# Patient Record
Sex: Female | Born: 1950 | Race: Black or African American | Hispanic: No | Marital: Married | Smoking: Never smoker
Health system: Southern US, Community
[De-identification: ages and names within clinical notes are randomized; demographics above are authoritative.]

## PROBLEM LIST (undated history)

## (undated) DIAGNOSIS — M1990 Unspecified osteoarthritis, unspecified site: Secondary | ICD-10-CM

## (undated) DIAGNOSIS — E0500 Thyrotoxicosis with diffuse goiter without thyrotoxic crisis or storm: Secondary | ICD-10-CM

## (undated) DIAGNOSIS — I10 Essential (primary) hypertension: Secondary | ICD-10-CM

## (undated) DIAGNOSIS — F419 Anxiety disorder, unspecified: Secondary | ICD-10-CM

## (undated) DIAGNOSIS — E785 Hyperlipidemia, unspecified: Secondary | ICD-10-CM

## (undated) HISTORY — PX: TUBAL LIGATION: SHX77

## (undated) HISTORY — PX: CATARACT EXTRACTION W/ INTRAOCULAR LENS  IMPLANT, BILATERAL: SHX1307

## (undated) HISTORY — PX: ABDOMINAL HYSTERECTOMY: SHX81

## (undated) HISTORY — PX: EYE SURGERY: SHX253

---

## 1998-08-16 ENCOUNTER — Other Ambulatory Visit: Admission: RE | Admit: 1998-08-16 | Discharge: 1998-08-16 | Payer: Self-pay | Admitting: Obstetrics and Gynecology

## 1999-08-28 ENCOUNTER — Encounter: Admission: RE | Admit: 1999-08-28 | Discharge: 1999-08-28 | Payer: Self-pay | Admitting: Obstetrics and Gynecology

## 1999-08-28 ENCOUNTER — Encounter: Payer: Self-pay | Admitting: Obstetrics and Gynecology

## 2000-03-16 ENCOUNTER — Ambulatory Visit (HOSPITAL_COMMUNITY): Admission: RE | Admit: 2000-03-16 | Discharge: 2000-03-16 | Payer: Self-pay | Admitting: Gastroenterology

## 2000-08-04 ENCOUNTER — Encounter: Admission: RE | Admit: 2000-08-04 | Discharge: 2000-08-04 | Payer: Self-pay | Admitting: Internal Medicine

## 2000-08-04 ENCOUNTER — Encounter: Payer: Self-pay | Admitting: Internal Medicine

## 2000-09-09 ENCOUNTER — Encounter: Admission: RE | Admit: 2000-09-09 | Discharge: 2000-09-09 | Payer: Self-pay | Admitting: Obstetrics and Gynecology

## 2000-09-09 ENCOUNTER — Encounter: Payer: Self-pay | Admitting: Obstetrics and Gynecology

## 2001-09-23 ENCOUNTER — Encounter: Admission: RE | Admit: 2001-09-23 | Discharge: 2001-09-23 | Payer: Self-pay | Admitting: Obstetrics and Gynecology

## 2001-09-23 ENCOUNTER — Encounter: Payer: Self-pay | Admitting: Obstetrics and Gynecology

## 2002-10-02 ENCOUNTER — Encounter: Admission: RE | Admit: 2002-10-02 | Discharge: 2002-10-02 | Payer: Self-pay | Admitting: Obstetrics and Gynecology

## 2002-10-02 ENCOUNTER — Encounter: Payer: Self-pay | Admitting: Obstetrics and Gynecology

## 2003-10-17 ENCOUNTER — Encounter: Admission: RE | Admit: 2003-10-17 | Discharge: 2003-10-17 | Payer: Self-pay | Admitting: Obstetrics and Gynecology

## 2004-11-05 ENCOUNTER — Encounter: Admission: RE | Admit: 2004-11-05 | Discharge: 2004-11-05 | Payer: Self-pay | Admitting: Obstetrics and Gynecology

## 2005-11-09 ENCOUNTER — Encounter: Admission: RE | Admit: 2005-11-09 | Discharge: 2005-11-09 | Payer: Self-pay | Admitting: Obstetrics and Gynecology

## 2006-03-29 ENCOUNTER — Encounter: Admission: RE | Admit: 2006-03-29 | Discharge: 2006-03-29 | Payer: Self-pay | Admitting: Neurology

## 2006-09-13 ENCOUNTER — Encounter: Admission: RE | Admit: 2006-09-13 | Discharge: 2006-09-13 | Payer: Self-pay | Admitting: Internal Medicine

## 2006-10-04 ENCOUNTER — Encounter: Admission: RE | Admit: 2006-10-04 | Discharge: 2006-10-04 | Payer: Self-pay | Admitting: Internal Medicine

## 2006-11-11 ENCOUNTER — Encounter: Admission: RE | Admit: 2006-11-11 | Discharge: 2006-11-11 | Payer: Self-pay | Admitting: Obstetrics and Gynecology

## 2007-05-16 ENCOUNTER — Encounter: Admission: RE | Admit: 2007-05-16 | Discharge: 2007-05-16 | Payer: Self-pay | Admitting: Internal Medicine

## 2007-11-16 ENCOUNTER — Emergency Department (HOSPITAL_COMMUNITY): Admission: EM | Admit: 2007-11-16 | Discharge: 2007-11-16 | Payer: Self-pay | Admitting: Emergency Medicine

## 2007-11-21 ENCOUNTER — Encounter: Admission: RE | Admit: 2007-11-21 | Discharge: 2007-11-21 | Payer: Self-pay | Admitting: Obstetrics and Gynecology

## 2008-11-21 ENCOUNTER — Encounter: Admission: RE | Admit: 2008-11-21 | Discharge: 2008-11-21 | Payer: Self-pay | Admitting: Internal Medicine

## 2008-11-30 ENCOUNTER — Encounter: Admission: RE | Admit: 2008-11-30 | Discharge: 2008-11-30 | Payer: Self-pay | Admitting: Internal Medicine

## 2009-02-07 ENCOUNTER — Emergency Department (HOSPITAL_COMMUNITY): Admission: EM | Admit: 2009-02-07 | Discharge: 2009-02-07 | Payer: Self-pay | Admitting: Emergency Medicine

## 2009-06-03 ENCOUNTER — Encounter: Admission: RE | Admit: 2009-06-03 | Discharge: 2009-06-03 | Payer: Self-pay | Admitting: Obstetrics and Gynecology

## 2009-12-04 ENCOUNTER — Encounter: Admission: RE | Admit: 2009-12-04 | Discharge: 2009-12-04 | Payer: Self-pay | Admitting: Obstetrics and Gynecology

## 2010-02-04 ENCOUNTER — Encounter: Admission: RE | Admit: 2010-02-04 | Discharge: 2010-02-04 | Payer: Self-pay | Admitting: Internal Medicine

## 2010-06-16 ENCOUNTER — Encounter: Payer: Self-pay | Admitting: Internal Medicine

## 2010-08-29 LAB — DIFFERENTIAL
Basophils Absolute: 0 10*3/uL (ref 0.0–0.1)
Basophils Relative: 1 % (ref 0–1)
Eosinophils Absolute: 0.2 10*3/uL (ref 0.0–0.7)
Eosinophils Relative: 5 % (ref 0–5)
Lymphocytes Relative: 39 % (ref 12–46)
Lymphs Abs: 2 10*3/uL (ref 0.7–4.0)
Monocytes Absolute: 0.5 10*3/uL (ref 0.1–1.0)
Monocytes Relative: 10 % (ref 3–12)
Neutro Abs: 2.3 10*3/uL (ref 1.7–7.7)
Neutrophils Relative %: 46 % (ref 43–77)

## 2010-08-29 LAB — BASIC METABOLIC PANEL
BUN: 14 mg/dL (ref 6–23)
CO2: 30 mEq/L (ref 19–32)
Calcium: 9.4 mg/dL (ref 8.4–10.5)
Chloride: 106 mEq/L (ref 96–112)
Creatinine, Ser: 1.11 mg/dL (ref 0.4–1.2)
GFR calc Af Amer: >60 mL/min (ref >60)
GFR calc non Af Amer: 50 mL/min — ABNORMAL LOW (ref >60)
Glucose, Bld: 123 mg/dL — ABNORMAL HIGH (ref 70–99)
Potassium: 3.9 mEq/L (ref 3.5–5.1)
Sodium: 141 mEq/L (ref 135–145)

## 2010-08-29 LAB — CBC
HCT: 35.3 % — ABNORMAL LOW (ref 36.0–46.0)
Hemoglobin: 11.8 g/dL — ABNORMAL LOW (ref 12.0–15.0)
MCHC: 33.3 g/dL (ref 30.0–36.0)
MCV: 89.8 fL (ref 78.0–100.0)
Platelets: 334 10*3/uL (ref 150–400)
RBC: 3.93 MIL/uL (ref 3.87–5.11)
RDW: 13.9 % (ref 11.5–15.5)
WBC: 5.1 10*3/uL (ref 4.0–10.5)

## 2010-08-29 LAB — POCT CARDIAC MARKERS
CKMB, poc: 3.3 ng/mL (ref 1.0–8.0)
Myoglobin, poc: 126 ng/mL (ref 12–200)
Troponin i, poc: <0.05 ng/mL (ref 0.00–0.09)

## 2010-10-10 NOTE — Procedures (Signed)
[place]  Patient:    Brooke Brown, Brooke Brown                   MRN: [id number] Proc. Date: 03/15/00 Adm. Date:  [id number] Disc. Date: [id number] Attending:  Charna Elizabeth CC:         Merlene Laughter. Renae Gloss, M.D.   Procedure Report  DATE OF BIRTH:  03-01-1951  PROCEDURE PERFORMED:  Flexible sigmoidoscopy up to 80 cm.  ENDOSCOPIST:  Anselmo Rod, M.D.  INSTRUMENT USED:  Olympus video colonoscope.  INDICATION FOR PROCEDURE:  Screening flexible sigmoidoscopy being performed in a 60 year old black female, rule out colonic polyps, masses, hemorrhoids, etc.  PREPROCEDURE PREPARATION:  Informed consent was procured from the patient. The patient was fasted for eight hours prior to the procedure and prepped with two fleets enemas the morning of the procedure.  PREPROCEDURE PHYSICAL:  VITAL SIGNS:  The patient has stable vital signs.  NECK: Supple.  CHEST:  Clear to auscultation.  S1 and S2 regular.  ABDOMEN:  Soft with normal abdominal bowel sounds.  DESCRIPTION OF PROCEDURE:  The patient was placed in the left lateral decubitus position.  No sedation was used.  Once the patient was adequately positioned, the Olympus video colonoscope was advanced from the rectum to 80 cm without difficulty.  No masses, polyps, erosions, ulcerations or abnormalities were seen.  There was no evidence of diverticulosis.  The patient tolerated the procedure well without complication.  IMPRESSION:  Normal flexible sigmoidoscopy up to 80 cm.  RECOMMENDATIONS:  Repeat colorectal cancer screening is recommended in the next five years or earlier if the patient develops any abnormal symptoms in the interim. DD:  03/17/00 TD:  03/18/00 Job: [id number]

## 2010-11-18 ENCOUNTER — Other Ambulatory Visit: Payer: Self-pay | Admitting: Obstetrics and Gynecology

## 2010-11-18 DIAGNOSIS — R921 Mammographic calcification found on diagnostic imaging of breast: Secondary | ICD-10-CM

## 2010-12-15 ENCOUNTER — Ambulatory Visit
Admission: RE | Admit: 2010-12-15 | Discharge: 2010-12-15 | Disposition: A | Discharge status: Home / Self Care | Payer: BC Managed Care – PPO | Source: Ambulatory Visit | Attending: Obstetrics and Gynecology | Admitting: Obstetrics and Gynecology

## 2010-12-15 DIAGNOSIS — R921 Mammographic calcification found on diagnostic imaging of breast: Secondary | ICD-10-CM

## 2011-02-19 LAB — POCT CARDIAC MARKERS
CKMB, poc: 3.6
Myoglobin, poc: 139
Operator id: 264031
Troponin i, poc: <0.05

## 2011-02-19 LAB — POCT I-STAT, CHEM 8
BUN: 15
Calcium, Ion: 1.16
Chloride: 100
Creatinine, Ser: 1.2
Glucose, Bld: 157 — ABNORMAL HIGH
HCT: 39
Hemoglobin: 13.3
Potassium: 3 — ABNORMAL LOW
Sodium: 138
TCO2: 28

## 2011-07-31 ENCOUNTER — Other Ambulatory Visit: Payer: Self-pay

## 2011-07-31 ENCOUNTER — Emergency Department (HOSPITAL_COMMUNITY): Payer: BC Managed Care – PPO

## 2011-07-31 ENCOUNTER — Emergency Department (HOSPITAL_COMMUNITY)
Admission: EM | Admit: 2011-07-31 | Discharge: 2011-07-31 | Disposition: A | Discharge status: Home / Self Care | Payer: BC Managed Care – PPO | Attending: Emergency Medicine | Admitting: Emergency Medicine

## 2011-07-31 ENCOUNTER — Encounter (HOSPITAL_COMMUNITY): Payer: Self-pay

## 2011-07-31 DIAGNOSIS — E119 Type 2 diabetes mellitus without complications: Secondary | ICD-10-CM | POA: Insufficient documentation

## 2011-07-31 DIAGNOSIS — R42 Dizziness and giddiness: Secondary | ICD-10-CM

## 2011-07-31 DIAGNOSIS — I10 Essential (primary) hypertension: Secondary | ICD-10-CM | POA: Insufficient documentation

## 2011-07-31 HISTORY — DX: Thyrotoxicosis with diffuse goiter without thyrotoxic crisis or storm: E05.00

## 2011-07-31 HISTORY — DX: Hyperlipidemia, unspecified: E78.5

## 2011-07-31 HISTORY — DX: Essential (primary) hypertension: I10

## 2011-07-31 LAB — COMPREHENSIVE METABOLIC PANEL
ALT: 17 U/L (ref 0–35)
AST: 30 U/L (ref 0–37)
Albumin: 4.2 g/dL (ref 3.5–5.2)
Alkaline Phosphatase: 27 U/L — ABNORMAL LOW (ref 39–117)
BUN: 20 mg/dL (ref 6–23)
CO2: 26 mEq/L (ref 19–32)
Calcium: 10.3 mg/dL (ref 8.4–10.5)
Chloride: 105 mEq/L (ref 96–112)
Creatinine, Ser: 0.87 mg/dL (ref 0.50–1.10)
GFR calc Af Amer: 81 mL/min — ABNORMAL LOW (ref >90)
GFR calc non Af Amer: 70 mL/min — ABNORMAL LOW (ref >90)
Glucose, Bld: 104 mg/dL — ABNORMAL HIGH (ref 70–99)
Potassium: 4.1 mEq/L (ref 3.5–5.1)
Sodium: 139 mEq/L (ref 135–145)
Total Bilirubin: 0.2 mg/dL — ABNORMAL LOW (ref 0.3–1.2)
Total Protein: 8.2 g/dL (ref 6.0–8.3)

## 2011-07-31 LAB — DIFFERENTIAL
Basophils Absolute: 0 10*3/uL (ref 0.0–0.1)
Basophils Relative: 1 % (ref 0–1)
Eosinophils Absolute: 0.2 10*3/uL (ref 0.0–0.7)
Eosinophils Relative: 4 % (ref 0–5)
Lymphocytes Relative: 48 % — ABNORMAL HIGH (ref 12–46)
Lymphs Abs: 2.3 10*3/uL (ref 0.7–4.0)
Monocytes Absolute: 0.4 10*3/uL (ref 0.1–1.0)
Monocytes Relative: 8 % (ref 3–12)
Neutro Abs: 1.9 10*3/uL (ref 1.7–7.7)
Neutrophils Relative %: 39 % — ABNORMAL LOW (ref 43–77)

## 2011-07-31 LAB — CBC
HCT: 38.8 % (ref 36.0–46.0)
Hemoglobin: 12.9 g/dL (ref 12.0–15.0)
MCH: 28.8 pg (ref 26.0–34.0)
MCHC: 33.2 g/dL (ref 30.0–36.0)
MCV: 86.6 fL (ref 78.0–100.0)
Platelets: 377 10*3/uL (ref 150–400)
RBC: 4.48 MIL/uL (ref 3.87–5.11)
RDW: 14.1 % (ref 11.5–15.5)
WBC: 4.8 10*3/uL (ref 4.0–10.5)

## 2011-07-31 LAB — URINALYSIS, ROUTINE W REFLEX MICROSCOPIC
Bilirubin Urine: NEGATIVE
Glucose, UA: NEGATIVE mg/dL
Hgb urine dipstick: NEGATIVE
Ketones, ur: NEGATIVE mg/dL
Nitrite: NEGATIVE
Protein, ur: NEGATIVE mg/dL
Specific Gravity, Urine: 1.028 (ref 1.005–1.030)
Urobilinogen, UA: 0.2 mg/dL (ref 0.0–1.0)
pH: 5.5 (ref 5.0–8.0)

## 2011-07-31 LAB — URINE MICROSCOPIC-ADD ON

## 2011-07-31 NOTE — ED Notes (Signed)
Pt here with c/o dizziness on and off x 2-3 months. Pt alos states she heard/felt like loud noise while back and felt like the building shook.

## 2011-07-31 NOTE — Discharge Instructions (Signed)

## 2011-07-31 NOTE — ED Notes (Signed)
Given to Dr. Radford Pax

## 2011-07-31 NOTE — ED Notes (Signed)
Pt given discharge instructions and verb understanding, amb indep to discharge window 

## 2011-07-31 NOTE — ED Provider Notes (Signed)
History     CSN: [id number]  Arrival date & time 07/31/11  1154   First MD Initiated Contact with Patient 07/31/11 1240      Chief Complaint  Patient presents with  . Dizziness    on/off x several months.      (Consider location/radiation/quality/duration/timing/severity/associated sxs/prior treatment) HPI Comments: Episodic dizziness for the past several months.  Started again today while driving.  She feels as though she is going to pass out and room spins.  Last for a few minutes, then seems to get better.  She denies headache or head trauma.  No aggravating or alleviating factors.    The history is provided by the patient.    Past Medical History  Diagnosis Date  . Hypertension   . Diabetes mellitus   . Hyperlipemia   . Grave's disease   . Grave's disease     Past Surgical History  Procedure Date  . Abdominal hysterectomy   . Tubal ligation     History reviewed. No pertinent family history.  History  Substance Use Topics  . Smoking status: Never Smoker   . Smokeless tobacco: Not on file  . Alcohol Use: Yes     rarely    OB History    Grav Para Term Preterm Abortions TAB SAB Ect Mult Living                  Review of Systems  All other systems reviewed and are negative.    Allergies  Review of patient's allergies indicates no known allergies.  Home Medications   Current Outpatient Rx  Name Route Sig Dispense Refill  . AMLODIPINE BESYLATE 10 MG PO TABS Oral Take 10 mg by mouth daily.    Marland Kitchen LEVOTHYROXINE SODIUM 100 MCG PO TABS Oral Take 100 mcg by mouth daily.    Marland Kitchen METFORMIN HCL 1000 MG PO TABS Oral Take 1,000 mg by mouth 2 (two) times daily with a meal.    . OLMESARTAN MEDOXOMIL 20 MG PO TABS Oral Take 20 mg by mouth daily.      BP 149/79  Pulse 79  Temp(Src) 98.5 F (36.9 C) (Oral)  Resp 18  Ht 5\' 5"  (1.651 m)  Wt 175 lb (79.379 kg)  BMI 29.12 kg/m2  SpO2 100%  Physical Exam  Nursing note and vitals reviewed. Constitutional: She is  oriented to person, place, and time. She appears well-developed and well-nourished. No distress.  HENT:  Head: Normocephalic and atraumatic.  Eyes: EOM are normal. Pupils are equal, round, and reactive to light.  Neck: Normal range of motion. Neck supple.  Cardiovascular: Normal rate and regular rhythm.  Exam reveals no gallop and no friction rub.   No murmur heard. Pulmonary/Chest: Effort normal and breath sounds normal. No respiratory distress. She has no wheezes.  Abdominal: Soft. Bowel sounds are normal. She exhibits no distension. There is no tenderness.  Musculoskeletal: Normal range of motion.  Neurological: She is alert and oriented to person, place, and time. No cranial nerve deficit. She exhibits normal muscle tone. Coordination normal.  Skin: Skin is warm and dry. She is not diaphoretic.    ED Course  Procedures (including critical care time)   Labs Reviewed  CBC  DIFFERENTIAL  COMPREHENSIVE METABOLIC PANEL  URINALYSIS, ROUTINE W REFLEX MICROSCOPIC   No results found.   No diagnosis found.   Date: 07/31/2011  Rate: 72  Rhythm: normal sinus rhythm  QRS Axis: normal  Intervals: normal  ST/T Wave abnormalities: normal  Conduction Disutrbances:none  Narrative Interpretation:   Old EKG Reviewed: none available    MDM  The symptoms sound vertiginous, however have been going on for months.  Today's workup looks okay.  If not improving in the next week, she needs to follow up with her doctor to discuss further testing, possibly and mri.          Geoffery Lyons, MD 07/31/11 [id number]

## 2011-11-19 ENCOUNTER — Other Ambulatory Visit: Payer: Self-pay | Admitting: Obstetrics and Gynecology

## 2011-11-19 DIAGNOSIS — Z1231 Encounter for screening mammogram for malignant neoplasm of breast: Secondary | ICD-10-CM

## 2011-12-16 ENCOUNTER — Ambulatory Visit
Admission: RE | Admit: 2011-12-16 | Discharge: 2011-12-16 | Disposition: A | Discharge status: Home / Self Care | Payer: BC Managed Care – PPO | Source: Ambulatory Visit | Attending: Obstetrics and Gynecology | Admitting: Obstetrics and Gynecology

## 2011-12-16 DIAGNOSIS — Z1231 Encounter for screening mammogram for malignant neoplasm of breast: Secondary | ICD-10-CM

## 2012-09-15 ENCOUNTER — Ambulatory Visit (INDEPENDENT_AMBULATORY_CARE_PROVIDER_SITE_OTHER): Payer: BC Managed Care – PPO | Admitting: Internal Medicine

## 2012-09-15 VITALS — BP 137/80 | HR 72 | Temp 98.5°F | Resp 16 | Ht 65.0 in | Wt 182.0 lb

## 2012-09-15 DIAGNOSIS — H6123 Impacted cerumen, bilateral: Secondary | ICD-10-CM

## 2012-09-15 DIAGNOSIS — H6120 Impacted cerumen, unspecified ear: Secondary | ICD-10-CM

## 2012-09-15 DIAGNOSIS — H9203 Otalgia, bilateral: Secondary | ICD-10-CM

## 2012-09-15 DIAGNOSIS — H9209 Otalgia, unspecified ear: Secondary | ICD-10-CM

## 2012-09-15 DIAGNOSIS — H9190 Unspecified hearing loss, unspecified ear: Secondary | ICD-10-CM

## 2012-09-15 DIAGNOSIS — H9193 Unspecified hearing loss, bilateral: Secondary | ICD-10-CM

## 2012-09-15 NOTE — Progress Notes (Signed)
  Subjective:    Patient ID: Brooke Brown, female    DOB: 21-Nov-1950, 62 y.o.   MRN: [id number]  HPI Has pressure, loss of hearing and popping right ear.   Review of Systems     Objective:   Physical Exam Cerumen impaction right ear, left not so bad       Assessment & Plan:  Irrigate ears till clear

## 2012-09-15 NOTE — Patient Instructions (Signed)
Cerumen Impaction  A cerumen impaction is when the wax in your ear forms a plug. This plug usually causes reduced hearing. Sometimes it also causes an earache or dizziness. Removing a cerumen impaction can be difficult and painful. The wax sticks to the ear canal. The canal is sensitive and bleeds easily. If you try to remove a heavy wax buildup with a cotton tipped swab, you may push it in further.  Irrigation with water, suction, and small ear curettes may be used to clear out the wax. If the impaction is fixed to the skin in the ear canal, ear drops may be needed for a few days to loosen the wax. People who build up a lot of wax frequently can use ear wax removal products available in your local drugstore.  SEEK MEDICAL CARE IF:    You develop an earache, increased hearing loss, or marked dizziness.  Document Released: 06/18/2004 Document Revised: 08/03/2011 Document Reviewed: 08/08/2009  Exit[employer] Patient Information 2013 [employer].

## 2012-11-28 ENCOUNTER — Other Ambulatory Visit: Payer: Self-pay

## 2012-11-28 DIAGNOSIS — Z1231 Encounter for screening mammogram for malignant neoplasm of breast: Secondary | ICD-10-CM

## 2012-12-16 ENCOUNTER — Ambulatory Visit
Admission: RE | Admit: 2012-12-16 | Discharge: 2012-12-16 | Disposition: A | Discharge status: Home / Self Care | Payer: BC Managed Care – PPO | Source: Ambulatory Visit

## 2012-12-16 DIAGNOSIS — Z1231 Encounter for screening mammogram for malignant neoplasm of breast: Secondary | ICD-10-CM

## 2012-12-28 ENCOUNTER — Encounter: Payer: Self-pay | Admitting: Family Medicine

## 2012-12-28 ENCOUNTER — Ambulatory Visit (INDEPENDENT_AMBULATORY_CARE_PROVIDER_SITE_OTHER): Payer: BC Managed Care – PPO | Admitting: Family Medicine

## 2012-12-28 VITALS — BP 138/80 | HR 74 | Temp 98.0°F | Resp 16 | Ht 64.5 in | Wt 181.4 lb

## 2012-12-28 DIAGNOSIS — M5481 Occipital neuralgia: Secondary | ICD-10-CM

## 2012-12-28 DIAGNOSIS — M531 Cervicobrachial syndrome: Secondary | ICD-10-CM

## 2012-12-28 DIAGNOSIS — M654 Radial styloid tenosynovitis [de Quervain]: Secondary | ICD-10-CM

## 2012-12-28 MED ORDER — CYCLOBENZAPRINE HCL 5 MG PO TABS: 5.0000 mg | ORAL_TABLET | Freq: Every evening | ORAL | Status: DC | PRN

## 2012-12-28 MED ORDER — PREDNISONE 20 MG PO TABS: ORAL_TABLET | ORAL | Status: DC

## 2012-12-28 NOTE — Progress Notes (Signed)
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  She has some stiffness in that side of the neck.  Has had a similar problem several years ago.  Objective:  Decreased neck ROM Tender right occiput Tender left radial thumb. Neck: Supple no adenopathy Patient moves 4 extremities easily. Skin: No rash HEENT: Unremarkable Extremities: Unremarkable with the exception of the tender left radial thumb  Assessment:  Occipital neuralgia, left de Quervain's synovitis  Plan: Occipital neuralgia - Plan: predniSONE (DELTASONE) 20 MG tablet, cyclobenzaprine (FLEXERIL) 5 MG tablet  De Quervain's tenosynovitis, left Referral to Dr. Amanda Pea  Signed, Elvina Sidle, MD

## 2012-12-28 NOTE — Patient Instructions (Addendum)
Occipital Neuralgia Neuralgias are attacks of sharp stabbing pain. They may be intermittent (comes and goes) or constant in nature. They may be brief attacks that last seconds to minutes and may come back for days to weeks. The neuralgias can occur as a result of a herpes zoster (shingles), chickenpox infection, or even following a herpes simplex infection (cold sore). TYPES OF NEURALGIA  When these pains are located in the back of the head and neck they are called occipital neuralgias.  When the pain is located between ribs it is called intercostal neuralgia.  When the pain is located in the face it is called trigeminal neuralgia. This is the most common neuralgia. It causes sharp, shock like pain on one side of your face. The neuralgias, which follow herpes zoster infections, often produce a constant burning pain. They may last from weeks to months and even years. The attacks of pain may come from injury or inflammation (irritation) to a nerve. Often the cause is unknown. The episodes of pain may be caused by light touch, movement, or even eating and sneezing. Usually these neuralgias occur after age 62. The neuralgias following shingles and trigeminal neuralgia are the most common. Although painful, these episodes do not threaten life and tend to lessen as we grow older. TREATMENT  There are many medications that may be helpful in the treatment of this disorder. Sometimes several medications may have to be tried before the right combination can be found for you. Some of these medications are:  Only take over-the-counter or prescription medications for pain, discomfort, or fever as directed by your caregiver.  Narcotic medications may be used to control the pain.  Antidepressants and medications used in epilepsy (seizure disorders) may be useful. LET YOUR CAREGIVER KNOW ABOUT:  If you do not obtain relief from medications.  Problems that are getting worse rather than better.  Troubling  side effects that you think are coming from the medication. Do not be discouraged if you do not obtain instant relief from the medications or help given you. Your caregiver can help you get through these episodes of pain with some persistence (continued trying) on your part also. Document Released: 05/05/2001 Document Revised: 08/03/2011 Document Reviewed: 05/11/2005 [facility] Patient Information 2014 [place], [place]. De Quervain's Tenosynovitis De Quervain's tenosynovitis involves inflammation of one or two tendon linings (sheaths) or strain of one or two tendons to the thumb: extensor pollicis brevis (EPB), or abductor pollicis longus (APL). This causes pain on the side of the wrist and base of the thumb. Tendon sheaths secrete a fluid that lubricates the tendon, allowing the tendon to move smoothly. When the sheath becomes inflamed, the tendon cannot move freely in the sheath. Both the EPB and APL tendons are important for proper use of the hand. The EPB tendon is important for straightening the thumb. The APL tendon is important for moving the thumb away from the index finger (abducting). The two tendons pass through a small tube (canal) in the wrist, near the base of the thumb. When the tendons become inflamed, pain is usually felt in this area. SYMPTOMS   Pain, tenderness, swelling, warmth, or redness over the base of the thumb and thumb side of the wrist.  Pain that gets worse when straightening the thumb.  Pain that gets worse when moving the thumb away from the index finger, against resistance.  Pain with pinching or gripping.  Locking or catching of the thumb.  Limited motion of the thumb.  Crackling sound (crepitation) when  the tendon or thumb is moved or touched.  Fluid-filled cyst in the area of the base of the thumb. CAUSES   Tenosynovitis is often linked with overuse of the wrist.  Tenosynovitis may be caused by repeated injury to the thumb muscle and tendon units, and with  repeated motions of the hand and wrist, due to friction of the tendon within the lining (sheath).  Tenosynovitis may also be due to a sudden increase in activity or change in activity. RISK INCREASES WITH:  Sports that involve repeated hand and wrist motions (golf, bowling, tennis, squash, racquetball).  Heavy labor.  Poor physical wrist strength and flexibility.  Failure to warm up properly before practice or play.  Female gender.  New mothers who hold their baby's head for long periods or lift infants with thumbs in the infant's armpit (axilla). PREVENTION  Warm up and stretch properly before practice or competition.  Allow enough time for rest and recovery between practices and competition.  Maintain appropriate conditioning:  Cardiovascular fitness.  Forearm, wrist, and hand flexibility.  Muscle strength and endurance.  Use proper exercise technique. PROGNOSIS  This condition is usually curable within 6 weeks, if treated properly with non-surgical treatment and resting of the affected area.  RELATED COMPLICATIONS   Longer healing time if not properly treated or if not given enough time to heal.  Chronic inflammation, causing recurring symptoms of tenosynovitis. Permanent pain or restriction of movement.  Risks of surgery: infection, bleeding, injury to nerves (numbness of the thumb), continued pain, incomplete release of the tendon sheath, recurring symptoms, cutting of the tendons, tendons sliding out of position, weakness of the thumb, thumb stiffness. TREATMENT  First, treatment involves the use of medicine and ice, to reduce pain and inflammation. Patients are encouraged to stop or modify activities that aggravate the injury. Stretching and strengthening exercises may be advised. Exercises may be completed at home or with a therapist. You may be fitted with a brace or splint, to limit motion and allow the injury to heal. Your caregiver may also choose to give you a  corticosteroid injection, to reduce the pain and inflammation. If non-surgical treatment is not successful, surgery may be needed. Most tenosynovitis surgeries are done as outpatient procedures (you go home the same day). Surgery may involve local, regional (whole arm), or general anesthesia.  MEDICATION   If pain medicine is needed, nonsteroidal anti-inflammatory medicines (aspirin and ibuprofen), or other minor pain relievers (acetaminophen), are often advised.  Do not take pain medicine for 7 days before surgery.  Prescription pain relievers are often prescribed only after surgery. Use only as directed and only as much as you need.  Corticosteroid injections may be given if your caregiver thinks they are needed. There is a limited number of times these injections may be given. COLD THERAPY   Cold treatment (icing) should be applied for 10 to 15 minutes every 2 to 3 hours for inflammation and pain, and immediately after activity that aggravates your symptoms. Use ice packs or an ice massage. SEEK MEDICAL CARE IF:   Symptoms get worse or do not improve in 2 to 4 weeks, despite treatment.  You experience pain, numbness, or coldness in the hand.  Blue, gray, or dark color appears in the fingernails.  Any of the following occur after surgery: increased pain, swelling, redness, drainage of fluids, bleeding in the affected area, or signs of infection.  New, unexplained symptoms develop. (Drugs used in treatment may produce side effects.) Document Released: 05/11/2005 Document  Revised: 08/03/2011 Document Reviewed: 08/23/2008 [employer] Patient Information 2014 [place], [place].

## 2013-11-28 ENCOUNTER — Other Ambulatory Visit: Payer: Self-pay

## 2013-11-28 DIAGNOSIS — Z1231 Encounter for screening mammogram for malignant neoplasm of breast: Secondary | ICD-10-CM

## 2013-12-19 ENCOUNTER — Ambulatory Visit
Admission: RE | Admit: 2013-12-19 | Discharge: 2013-12-19 | Disposition: A | Discharge status: Home / Self Care | Payer: BC Managed Care – PPO | Source: Ambulatory Visit

## 2013-12-19 ENCOUNTER — Encounter (INDEPENDENT_AMBULATORY_CARE_PROVIDER_SITE_OTHER): Payer: Self-pay

## 2013-12-19 DIAGNOSIS — Z1231 Encounter for screening mammogram for malignant neoplasm of breast: Secondary | ICD-10-CM

## 2013-12-20 ENCOUNTER — Encounter: Payer: Self-pay | Admitting: Neurology

## 2013-12-20 ENCOUNTER — Ambulatory Visit (INDEPENDENT_AMBULATORY_CARE_PROVIDER_SITE_OTHER): Payer: BC Managed Care – PPO | Admitting: Neurology

## 2013-12-20 VITALS — BP 120/60 | HR 84 | Ht 65.0 in | Wt 184.4 lb

## 2013-12-20 DIAGNOSIS — R42 Dizziness and giddiness: Secondary | ICD-10-CM | POA: Insufficient documentation

## 2013-12-20 DIAGNOSIS — E1122 Type 2 diabetes mellitus with diabetic chronic kidney disease: Secondary | ICD-10-CM | POA: Insufficient documentation

## 2013-12-20 DIAGNOSIS — I10 Essential (primary) hypertension: Secondary | ICD-10-CM | POA: Insufficient documentation

## 2013-12-20 DIAGNOSIS — R292 Abnormal reflex: Secondary | ICD-10-CM

## 2013-12-20 DIAGNOSIS — E785 Hyperlipidemia, unspecified: Secondary | ICD-10-CM

## 2013-12-20 DIAGNOSIS — N182 Chronic kidney disease, stage 2 (mild): Secondary | ICD-10-CM | POA: Insufficient documentation

## 2013-12-20 DIAGNOSIS — IMO0002 Reserved for concepts with insufficient information to code with codable children: Secondary | ICD-10-CM

## 2013-12-20 DIAGNOSIS — E119 Type 2 diabetes mellitus without complications: Secondary | ICD-10-CM | POA: Insufficient documentation

## 2013-12-20 DIAGNOSIS — M792 Neuralgia and neuritis, unspecified: Secondary | ICD-10-CM

## 2013-12-20 DIAGNOSIS — M542 Cervicalgia: Secondary | ICD-10-CM

## 2013-12-20 MED ORDER — GABAPENTIN 300 MG PO CAPS: ORAL_CAPSULE | ORAL | Status: DC

## 2013-12-20 NOTE — Progress Notes (Signed)
NEUROLOGY CONSULTATION NOTE  Brooke Brown MRN: [id number] DOB: 27-Aug-1950  Referring provider: Dr. Glendale Chard Primary care provider: Dr. Glendale Chard  Reason for consult:  Headaches, dizziness  Dear Dr Baird Cancer:  Thank you for your kind referral of Brooke Brown for consultation of the above symptoms. Although her history is well known to you, please allow me to reiterate it for the purpose of our medical record. Records and images were personally reviewed where available.  HISTORY OF PRESENT ILLNESS: This is a pleasant 63 year old right-handed woman with vascular risk factors including hypertension, hyperlipidemia, diabetes, and hypothyroidism, presenting with headaches and dizziness that started a year ago.  Headaches are localized over the right occipital region radiating throughout her head, sometimes the right side of her face would feel numb for a minute.  Headaches occur 4-5 times a week, lasting a few minutes with a sensation that someone is squeezing or turning something in her head.  She denies tenderness to palpation.  There is no associated nausea, vomiting, photo/phonophobia.  She recalls similar pains 5-6 years ago where she had to go 3 times to Urgent Care until she was given an unrecalled injection that stopped the pain.  She has had chronic neck pain, soreness, and stiffness for several years and recalls being told she had arthritis.  Over the past year, she has been having recurrent episodes of dizziness occurring 3-4 times a week with a spinning sensation that is not positional, it can occur while sitting, standing, or walking, lasting for a few seconds but she has to sit down if walking because she would feel unsteady. No associated nausea or vomiting, this may or may not occur with the headaches. She has occasional ringing in her right ear. She had a hearing test and was told it was normal.  She denies any head injuries or infections, no ear pain/fullness.  She  was given a prescription for Xanax due to possibility of dizziness related to panic attacks, however she has been afraid to take the medication.  She denies any diplopia, dysarthria, dysphagia, bowel/bladder dysfunction.  She has occasional back pain and occasional numbness in her left arm with tenderness over her left thumb. She does a lot of repetitive movements with typing.  There is no family history of headaches.  Laboratory Data: 10/23/13: CMP normal, HbA1c 6.9.  PAST MEDICAL HISTORY: Past Medical History  Diagnosis Date  . Hypertension   . Diabetes mellitus   . Hyperlipemia   . Grave's disease   . Grave's disease     PAST SURGICAL HISTORY: Past Surgical History  Procedure Laterality Date  . Abdominal hysterectomy    . Tubal ligation      MEDICATIONS: Current Outpatient Prescriptions on File Prior to Visit  Medication Sig Dispense Refill  . Choline Fenofibrate (TRILIPIX) 135 MG capsule Take 135 mg by mouth daily.      Marland Kitchen levothyroxine (SYNTHROID, LEVOTHROID) 100 MCG tablet Take 100 mcg by mouth daily.      . metFORMIN (GLUMETZA) 500 MG (MOD) 24 hr tablet Take 1,000 mg by mouth daily.       No current facility-administered medications on file prior to visit.    ALLERGIES: No Known Allergies  FAMILY HISTORY: History reviewed. No pertinent family history.  SOCIAL HISTORY: History   Social History  . Marital Status: Married    Spouse Name: N/A    Number of Children: N/A  . Years of Education: N/A   Occupational History  .  Not on file.   Social History Main Topics  . Smoking status: Never Smoker   . Smokeless tobacco: Not on file  . Alcohol Use: Yes     Comment: rarely  . Drug Use: No  . Sexual Activity: Not on file   Other Topics Concern  . Not on file   Social History Narrative  . No narrative on file    REVIEW OF SYSTEMS: Constitutional: No fevers, chills, or sweats, no generalized fatigue, change in appetite Eyes: No visual changes, double vision,  eye pain Ear, nose and throat: No hearing loss, ear pain, nasal congestion, sore throat Cardiovascular: No chest pain, palpitations Respiratory:  No shortness of breath at rest or with exertion, wheezes GastrointestinaI: No nausea, vomiting, diarrhea, abdominal pain, fecal incontinence Genitourinary:  No dysuria, urinary retention or frequency Musculoskeletal:  + neck pain, back pain Integumentary: No rash, pruritus, skin lesions Neurological: as above Psychiatric: No depression, insomnia, anxiety Endocrine: No palpitations, fatigue, diaphoresis, mood swings, change in appetite, change in weight, increased thirst Hematologic/Lymphatic:  No anemia, purpura, petechiae. Allergic/Immunologic: no itchy/runny eyes, nasal congestion, recent allergic reactions, rashes  PHYSICAL EXAM: Filed Vitals:   12/20/13 0952  BP: 120/60  Pulse: 84   General: No acute distress Head:  Normocephalic/atraumatic, no tenderness over the temporal or occipital regions Eyes: Fundoscopic exam shows bilateral sharp discs, no vessel changes, exudates, or hemorrhages Neck: supple, +paraspinal tenderness, full range of motion Back: No paraspinal tenderness Heart: regular rate and rhythm Lungs: Clear to auscultation bilaterally. Vascular: No carotid bruits. Skin/Extremities: No rash, no edema Neurological Exam: Mental status: alert and oriented to person, place, and time, no dysarthria or aphasia, Fund of knowledge is appropriate.  Recent and remote memory are intact.  Attention and concentration are normal.    Able to name objects and repeat phrases. Cranial nerves: CN I: not tested CN II: pupils equal, round and reactive to light, visual fields intact, fundi unremarkable. CN III, IV, VI:  full range of motion, no nystagmus, no ptosis CN V: facial sensation intact CN VII: upper and lower face symmetric CN VIII: hearing intact to finger rub CN IX, X: gag intact, uvula midline CN XI: sternocleidomastoid and  trapezius muscles intact CN XII: tongue midline Bulk & Tone: normal, no fasciculations. Motor: 5/5 throughout with no pronator drift. Sensation: decreased pin sensation up to above ankles bilaterally, decreased vibration up to ankles, decreased cold on left foot and up to ankle on right. Intact to all modalities on both UE. Romberg test negative Deep Tendon Reflexes: brisk +3 on right UE with +Hoffman's sign, +2 on left UE and both LE including ankle jerks. no ankle clonus Plantar responses: downgoing bilaterally Cerebellar: no incoordination on finger to nose, heel to shin. No dysdiadochokinesia Gait: narrow-based and steady, able to tandem walk adequately. Tremor: none  IMPRESSION: This is a pleasant 63 year old right-handed woman with a history of hypertension, hyperlipidemia, diabetes, presenting with recurrent headaches and dizziness that started a year ago.  Headaches are localized over the right occipital region, considerations include occipital neuralgia versus cervicogenic headaches. We discussed various etiologies of dizziness. Her neurological exam shows asymmetric reflexes with hyperreflexia on the right UE and signs of peripheral neuropathy.  MRI brain without contrast will be ordered to assess for underlying structural abnormality. I would also recommend a cervical MRI to assess for cervical stenosis/myelopathy. We discussed that if neck findings are significant, she will likely benefit from physical therapy, and possibly vestibular therapy for the dizziness as  well.  She will start gabapentin 300mg  qhs for neuralgia, side effects were discussed, this may be uptitrated as tolerated.  She will follow-up in 3 months.  Thank you for allowing me to participate in the care of this patient. Please do not hesitate to call for any questions or concerns.   Ellouise Newer, M.D.  CC: Dr. Baird Cancer

## 2013-12-20 NOTE — Patient Instructions (Signed)
1. MRI brain without contrast 2. MRI cervical spine without contrast 3. Start Gabapentin 300mg : Take 1 capsule daily at bedtime 4. If neck imaging shows significant changes, we will plan for physical therapy

## 2013-12-29 ENCOUNTER — Ambulatory Visit
Admission: RE | Admit: 2013-12-29 | Discharge: 2013-12-29 | Disposition: A | Discharge status: Home / Self Care | Payer: BC Managed Care – PPO | Source: Ambulatory Visit | Attending: Neurology | Admitting: Neurology

## 2013-12-29 DIAGNOSIS — M542 Cervicalgia: Secondary | ICD-10-CM

## 2013-12-29 DIAGNOSIS — M792 Neuralgia and neuritis, unspecified: Secondary | ICD-10-CM

## 2013-12-29 DIAGNOSIS — R292 Abnormal reflex: Secondary | ICD-10-CM

## 2013-12-29 DIAGNOSIS — R42 Dizziness and giddiness: Secondary | ICD-10-CM

## 2014-01-03 ENCOUNTER — Other Ambulatory Visit: Payer: Self-pay | Admitting: Family Medicine

## 2014-01-03 DIAGNOSIS — M542 Cervicalgia: Secondary | ICD-10-CM

## 2014-03-16 ENCOUNTER — Telehealth: Payer: Self-pay | Admitting: Neurology

## 2014-03-16 NOTE — Telephone Encounter (Signed)
Pt called to cancel her 03/21/14 f/u appt. Pt did not give a reason.

## 2014-03-21 ENCOUNTER — Ambulatory Visit: Payer: BC Managed Care – PPO | Admitting: Neurology

## 2014-12-18 ENCOUNTER — Other Ambulatory Visit: Payer: Self-pay

## 2014-12-18 DIAGNOSIS — Z1231 Encounter for screening mammogram for malignant neoplasm of breast: Secondary | ICD-10-CM

## 2015-01-24 ENCOUNTER — Ambulatory Visit
Admission: RE | Admit: 2015-01-24 | Discharge: 2015-01-24 | Disposition: A | Discharge status: Home / Self Care | Payer: BLUE CROSS/BLUE SHIELD | Source: Ambulatory Visit

## 2015-01-24 DIAGNOSIS — Z1231 Encounter for screening mammogram for malignant neoplasm of breast: Secondary | ICD-10-CM

## 2015-09-21 ENCOUNTER — Ambulatory Visit (INDEPENDENT_AMBULATORY_CARE_PROVIDER_SITE_OTHER): Payer: Medicare Other | Admitting: Family Medicine

## 2015-09-21 VITALS — BP 118/72 | HR 86 | Temp 98.8°F | Resp 16 | Ht 65.0 in | Wt 180.5 lb

## 2015-09-21 DIAGNOSIS — M778 Other enthesopathies, not elsewhere classified: Secondary | ICD-10-CM

## 2015-09-21 MED ORDER — DICLOFENAC SODIUM 1 % TD GEL: 2.0000 g | Freq: Four times a day (QID) | TRANSDERMAL | Status: DC

## 2015-09-21 MED ORDER — PREDNISONE 5 MG (48) PO TBPK: ORAL_TABLET | ORAL | Status: DC

## 2015-09-21 NOTE — Progress Notes (Signed)
Brooke Brown is a 65 y.o. female who presents to Urgent Care today for right wrist pain. Patient has a several day history of right dorsal wrist pain and hand pain. Symptoms are worse with wrist flexion and hand flexion. She denies any injury or increasing levels of activity. No fevers chills nausea vomiting or diarrhea. She has tried ibuprofen which helps.   Past Medical History  Diagnosis Date  . Hypertension   . Diabetes mellitus   . Hyperlipemia   . Grave's disease   . Grave's disease    Past Surgical History  Procedure Laterality Date  . Abdominal hysterectomy    . Tubal ligation     Social History  Substance Use Topics  . Smoking status: Never Smoker   . Smokeless tobacco: Never Used  . Alcohol Use: 0.0 oz/week    0 Standard drinks or equivalent per week     Comment: rarely   ROS as above Medications: Current Outpatient Prescriptions  Medication Sig Dispense Refill  . Choline Fenofibrate (TRILIPIX) 135 MG capsule Take 135 mg by mouth daily.    Marland Kitchen levothyroxine (SYNTHROID, LEVOTHROID) 100 MCG tablet Take 100 mcg by mouth daily.    . metFORMIN (GLUMETZA) 500 MG (MOD) 24 hr tablet Take 1,000 mg by mouth daily.    . Olmesartan-Amlodipine-HCTZ (TRIBENZOR) 40-5-12.5 MG TABS Take by mouth.    . diclofenac sodium (VOLTAREN) 1 % GEL Apply 2 g topically 4 (four) times daily. To affected joint. 100 g 11   No current facility-administered medications for this visit.   No Known Allergies   Exam:  BP 118/72 mmHg  Pulse 86  Temp(Src) 98.8 F (37.1 C) (Oral)  Resp 16  Ht 5\' 5"  (1.651 m)  Wt 180 lb 8 oz (81.874 kg)  BMI 30.04 kg/m2  SpO2 97% Gen: Well NAD Right wrist is normal-appearing without significant swelling. Tender palpation overlying the dorsal wrist and hand. Pain with grip and wrist flexion. Pain with resisted wrist extension. Negative Tinel's test overlying carpal tunnel. Negative Finkelstein's and Phalen's test.    No results found for this or any  previous visit (from the past 24 hour(s)). No results found.  Assessment and Plan: 65 y.o. female with symptoms and exam are consistent with tendinitis of the fourth dorsal wrist compartment of the right hand and wrist. Plan for wrist brace diclofenac gel and Tylenol. Use prednisone if not better.  Discussed warning signs or symptoms. Please see discharge instructions. Patient expresses understanding.

## 2015-09-21 NOTE — Patient Instructions (Addendum)
  Thank you for coming in today. Use the wrist brace as needed. Continue Tylenol for pain control. Use Voltaren gel 4 times daily for pain as needed.  Follow-up with me at the [facility] med center as needed  Dr Lynne Leader Address: [address] [place], [place] [address] Phone: [phone]  Tendinitis Tendinitis is swelling and inflammation of the tendons. Tendons are band-like tissues that connect muscle to bone. Tendinitis commonly occurs in the:   Shoulders (rotator cuff).  Heels (Achilles tendon).  Elbows (triceps tendon). CAUSES Tendinitis is usually caused by overusing the tendon, muscles, and joints involved. When the tissue surrounding a tendon (synovium) becomes inflamed, it is called tenosynovitis. Tendinitis commonly develops in people whose jobs require repetitive motions. SYMPTOMS  Pain.  Tenderness.  Mild swelling. DIAGNOSIS Tendinitis is usually diagnosed by physical exam. Your health care provider may also order X-rays or other imaging tests. TREATMENT Your health care provider may recommend certain medicines or exercises for your treatment. HOME CARE INSTRUCTIONS   Use a sling or splint for as long as directed by your health care provider until the pain decreases.  Put ice on the injured area.  Put ice in a plastic bag.  Place a towel between your skin and the bag.  Leave the ice on for 15-20 minutes, 3-4 times a day, or as directed by your health care provider.  Avoid using the limb while the tendon is painful. Perform gentle range of motion exercises only as directed by your health care provider. Stop exercises if pain or discomfort increase, unless directed otherwise by your health care provider.  Only take over-the-counter or prescription medicines for pain, discomfort, or fever as directed by your health care provider. SEEK MEDICAL CARE IF:   Your pain and swelling increase.  You develop new, unexplained symptoms, especially  increased numbness in the hands. MAKE SURE YOU:   Understand these instructions.  Will watch your condition.  Will get help right away if you are not doing well or get worse.   This information is not intended to replace advice given to you by your health care provider. Make sure you discuss any questions you have with your health care provider.   Document Released: 05/08/2000 Document Revised: 06/01/2014 Document Reviewed: 07/28/2010 Elsevier Interactive Patient Education 2016 [employer].    IF you received an x-ray today, you will receive an invoice from [facility]. Please contact [facility] at [phone] with questions or concerns regarding your invoice.   IF you received labwork today, you will receive an invoice from [employer]. Please contact [facility] at [phone] with questions or concerns regarding your invoice.   Our billing staff will not be able to assist you with questions regarding bills from these companies.  You will be contacted with the lab results as soon as they are available. The fastest way to get your results is to activate your My Chart account. Instructions are located on the last page of this paperwork. If you have not heard from [place] regarding the results in 2 weeks, please contact this office.

## 2015-09-30 ENCOUNTER — Telehealth: Payer: Self-pay

## 2015-09-30 DIAGNOSIS — M25531 Pain in right wrist: Secondary | ICD-10-CM | POA: Insufficient documentation

## 2015-09-30 NOTE — Telephone Encounter (Signed)
PA completed for diclofenac gel on covermymeds. This was Rxd for R wrist tendonitis, not OA, so not sure it will be covered. Pt has tried ibuprofen also. Pending.

## 2015-09-30 NOTE — Telephone Encounter (Signed)
PA approved indefinitely, starting w/today's date. Notified pharm.

## 2015-12-02 ENCOUNTER — Ambulatory Visit (INDEPENDENT_AMBULATORY_CARE_PROVIDER_SITE_OTHER): Payer: Medicare Other | Admitting: Family Medicine

## 2015-12-02 VITALS — BP 122/72 | HR 80 | Temp 98.4°F | Resp 17 | Ht 66.5 in | Wt 180.0 lb

## 2015-12-02 DIAGNOSIS — J209 Acute bronchitis, unspecified: Secondary | ICD-10-CM

## 2015-12-02 DIAGNOSIS — J069 Acute upper respiratory infection, unspecified: Secondary | ICD-10-CM

## 2015-12-02 MED ORDER — AMOXICILLIN 875 MG PO TABS: 875.0000 mg | ORAL_TABLET | Freq: Two times a day (BID) | ORAL | Status: DC

## 2015-12-02 MED ORDER — BENZONATATE 100 MG PO CAPS
100.0000 mg | ORAL_CAPSULE | Freq: Three times a day (TID) | ORAL | Status: DC | PRN
Start: 2015-12-02 — End: 2017-07-30

## 2015-12-02 MED ORDER — HYDROCODONE-HOMATROPINE 5-1.5 MG/5ML PO SYRP: 5.0000 mL | ORAL_SOLUTION | ORAL | Status: DC | PRN

## 2015-12-02 NOTE — Progress Notes (Signed)
Patient ID: Brooke Brown, female    DOB: [birth year]  Age: 65 y.o. MRN: [id number]  Chief Complaint  Patient presents with  . Sinusitis  . URI    Subjective:   65 year old lady who has had a two-week history of a respiratory tract infection. It started with a sore throat and cold. She has not been running a fever but she just feels bad rundown and weak. She has had a lot of coughing a fairly nonpurulent phlegm. She has keep a cup at her bedside to cough and 2. She's had some runny nose and takes Coricidin for that. She does not smoke. Generally is healthy. She is retired but probably going to go back and work 6 weeks for somebody.  Current allergies, medications, problem list, past/family and social histories reviewed.  Objective:  BP 122/72 mmHg  Pulse 80  Temp(Src) 98.4 F (36.9 C) (Oral)  Resp 17  Ht 5' 6.5" (1.689 m)  Wt 180 lb (81.647 kg)  BMI 28.62 kg/m2  SpO2 98%  Alert healthy lady, coughing a lot. TMs have a little wax bilaterally. Not inflamed. Throat not erythematous. Neck supple without nodes. Chest clear to auscultation. Heart regular without murmur.  Assessment & Plan:   Assessment: 1. Acute bronchitis, unspecified organism   2. Acute upper respiratory infection       Plan: Treat as per instructions. No testing today.  No orders of the defined types were placed in this encounter.    Meds ordered this encounter  Medications  . amoxicillin (AMOXIL) 875 MG tablet    Sig: Take 1 tablet (875 mg total) by mouth 2 (two) times daily.    Dispense:  20 tablet    Refill:  0  . benzonatate (TESSALON) 100 MG capsule    Sig: Take 1-2 capsules (100-200 mg total) by mouth 3 (three) times daily as needed.    Dispense:  30 capsule    Refill:  0  . HYDROcodone-homatropine (HYCODAN) 5-1.5 MG/5ML syrup    Sig: Take 5 mLs by mouth every 4 (four) hours as needed.    Dispense:  120 mL    Refill:  0         Patient Instructions   Drink plenty of fluids and get  enough rest  Continue using the course if needed for head congestion  Take the Hycodan cough syrup 1 teaspoon every 6 hours if needed for cough. This is best use at bedtime because it will tend to make you drowsy.  In the daytime use the benzonatate cough pills one or 2 pills 3 times daily  Take amoxicillin 875 mg one twice daily for infection  Although labs and x-rays do not appear to be needed today, please return if you're getting worse because she might require further evaluation at that time.    IF you received an x-ray today, you will receive an invoice from [facility]. Please contact [facility] at [phone] with questions or concerns regarding your invoice.   IF you received labwork today, you will receive an invoice from [employer]. Please contact [facility] at [phone] with questions or concerns regarding your invoice.   Our billing staff will not be able to assist you with questions regarding bills from these companies.  You will be contacted with the lab results as soon as they are available. The fastest way to get your results is to activate your My Chart account. Instructions are located on the last page of this paperwork. If  you have not heard from [place] regarding the results in 2 weeks, please contact this office.          Return if symptoms worsen or fail to improve.   HOPPER,DAVID, MD 12/02/2015

## 2015-12-02 NOTE — Patient Instructions (Addendum)
Drink plenty of fluids and get enough rest  Continue using the course if needed for head congestion  Take the Hycodan cough syrup 1 teaspoon every 6 hours if needed for cough. This is best use at bedtime because it will tend to make you drowsy.  In the daytime use the benzonatate cough pills one or 2 pills 3 times daily  Take amoxicillin 875 mg one twice daily for infection  Although labs and x-rays do not appear to be needed today, please return if you're getting worse because she might require further evaluation at that time.    IF you received an x-ray today, you will receive an invoice from [facility]. Please contact [facility] at [phone] with questions or concerns regarding your invoice.   IF you received labwork today, you will receive an invoice from [employer]. Please contact [facility] at [phone] with questions or concerns regarding your invoice.   Our billing staff will not be able to assist you with questions regarding bills from these companies.  You will be contacted with the lab results as soon as they are available. The fastest way to get your results is to activate your My Chart account. Instructions are located on the last page of this paperwork. If you have not heard from [place] regarding the results in 2 weeks, please contact this office.

## 2016-01-22 ENCOUNTER — Other Ambulatory Visit: Payer: Self-pay | Admitting: Internal Medicine

## 2016-01-22 DIAGNOSIS — Z1231 Encounter for screening mammogram for malignant neoplasm of breast: Secondary | ICD-10-CM

## 2016-01-30 ENCOUNTER — Ambulatory Visit
Admission: RE | Admit: 2016-01-30 | Discharge: 2016-01-30 | Disposition: A | Discharge status: Home / Self Care | Payer: Medicare Other | Source: Ambulatory Visit | Attending: Internal Medicine | Admitting: Internal Medicine

## 2016-01-30 DIAGNOSIS — Z1231 Encounter for screening mammogram for malignant neoplasm of breast: Secondary | ICD-10-CM

## 2016-06-22 DIAGNOSIS — H25811 Combined forms of age-related cataract, right eye: Secondary | ICD-10-CM | POA: Diagnosis not present

## 2016-06-22 DIAGNOSIS — H2511 Age-related nuclear cataract, right eye: Secondary | ICD-10-CM | POA: Diagnosis not present

## 2016-09-07 DIAGNOSIS — E1122 Type 2 diabetes mellitus with diabetic chronic kidney disease: Secondary | ICD-10-CM | POA: Diagnosis not present

## 2016-09-07 DIAGNOSIS — N182 Chronic kidney disease, stage 2 (mild): Secondary | ICD-10-CM | POA: Diagnosis not present

## 2016-09-07 DIAGNOSIS — I129 Hypertensive chronic kidney disease with stage 1 through stage 4 chronic kidney disease, or unspecified chronic kidney disease: Secondary | ICD-10-CM | POA: Diagnosis not present

## 2016-09-07 DIAGNOSIS — N08 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: Secondary | ICD-10-CM | POA: Diagnosis not present

## 2016-10-15 DIAGNOSIS — H6123 Impacted cerumen, bilateral: Secondary | ICD-10-CM | POA: Diagnosis not present

## 2016-10-15 DIAGNOSIS — R42 Dizziness and giddiness: Secondary | ICD-10-CM | POA: Diagnosis not present

## 2016-12-08 DIAGNOSIS — H04123 Dry eye syndrome of bilateral lacrimal glands: Secondary | ICD-10-CM | POA: Diagnosis not present

## 2016-12-21 DIAGNOSIS — F409 Phobic anxiety disorder, unspecified: Secondary | ICD-10-CM | POA: Diagnosis not present

## 2016-12-21 DIAGNOSIS — I129 Hypertensive chronic kidney disease with stage 1 through stage 4 chronic kidney disease, or unspecified chronic kidney disease: Secondary | ICD-10-CM | POA: Diagnosis not present

## 2016-12-21 DIAGNOSIS — F418 Other specified anxiety disorders: Secondary | ICD-10-CM | POA: Diagnosis not present

## 2016-12-21 DIAGNOSIS — N182 Chronic kidney disease, stage 2 (mild): Secondary | ICD-10-CM | POA: Diagnosis not present

## 2016-12-21 DIAGNOSIS — Z1389 Encounter for screening for other disorder: Secondary | ICD-10-CM | POA: Diagnosis not present

## 2016-12-21 DIAGNOSIS — Z6829 Body mass index (BMI) 29.0-29.9, adult: Secondary | ICD-10-CM | POA: Diagnosis not present

## 2016-12-22 ENCOUNTER — Other Ambulatory Visit: Payer: Self-pay | Admitting: *Deleted

## 2016-12-22 NOTE — Patient Outreach (Signed)
[facility] Screening call, unsuccessful but left a message for a return call. If I do not hear back from the member I will try again within the week.  Carroll C. Spinks, MSN, GNP-BC Gerontological Nurse Practitioner [facility] Care Management [phone]  

## 2017-01-04 ENCOUNTER — Other Ambulatory Visit: Payer: Self-pay | Admitting: *Deleted

## 2017-01-04 NOTE — Patient Outreach (Signed)
[facility] Screening call #2, unsuccessful but left a message for a return call. If I do not hear back from the member I will try again within the week.  Brooke Brown. Brooke Neither, MSN, [facility] Gerontological Nurse Practitioner [facility] Care Management [phone]

## 2017-01-06 ENCOUNTER — Other Ambulatory Visit: Payer: Self-pay | Admitting: Internal Medicine

## 2017-01-06 DIAGNOSIS — Z1231 Encounter for screening mammogram for malignant neoplasm of breast: Secondary | ICD-10-CM

## 2017-01-07 ENCOUNTER — Encounter: Payer: Self-pay | Admitting: *Deleted

## 2017-01-07 ENCOUNTER — Other Ambulatory Visit: Payer: Self-pay | Admitting: *Deleted

## 2017-01-07 NOTE — Patient Outreach (Signed)
[facility] Screening call #3 unsuccessful. I left another message and will send this member a letter about our services.  Eulah Pont. Myrtie Neither, MSN, [facility] Gerontological Nurse Practitioner [facility] Care Management [phone]

## 2017-01-11 DIAGNOSIS — N08 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: Secondary | ICD-10-CM | POA: Diagnosis not present

## 2017-01-11 DIAGNOSIS — E1122 Type 2 diabetes mellitus with diabetic chronic kidney disease: Secondary | ICD-10-CM | POA: Diagnosis not present

## 2017-01-11 DIAGNOSIS — I129 Hypertensive chronic kidney disease with stage 1 through stage 4 chronic kidney disease, or unspecified chronic kidney disease: Secondary | ICD-10-CM | POA: Diagnosis not present

## 2017-01-11 DIAGNOSIS — N182 Chronic kidney disease, stage 2 (mild): Secondary | ICD-10-CM | POA: Diagnosis not present

## 2017-02-01 ENCOUNTER — Ambulatory Visit
Admission: RE | Admit: 2017-02-01 | Discharge: 2017-02-01 | Disposition: A | Discharge status: Home / Self Care | Payer: PPO | Source: Ambulatory Visit | Attending: Internal Medicine | Admitting: Internal Medicine

## 2017-02-01 DIAGNOSIS — Z1231 Encounter for screening mammogram for malignant neoplasm of breast: Secondary | ICD-10-CM | POA: Diagnosis not present

## 2017-03-02 DIAGNOSIS — Z23 Encounter for immunization: Secondary | ICD-10-CM | POA: Diagnosis not present

## 2017-04-27 DIAGNOSIS — E1165 Type 2 diabetes mellitus with hyperglycemia: Secondary | ICD-10-CM | POA: Diagnosis not present

## 2017-04-27 DIAGNOSIS — R1031 Right lower quadrant pain: Secondary | ICD-10-CM | POA: Diagnosis not present

## 2017-04-27 DIAGNOSIS — R1030 Lower abdominal pain, unspecified: Secondary | ICD-10-CM | POA: Diagnosis not present

## 2017-06-10 ENCOUNTER — Other Ambulatory Visit: Payer: Self-pay | Admitting: Gastroenterology

## 2017-06-10 DIAGNOSIS — K5901 Slow transit constipation: Secondary | ICD-10-CM | POA: Diagnosis not present

## 2017-06-10 DIAGNOSIS — R1031 Right lower quadrant pain: Secondary | ICD-10-CM

## 2017-06-10 DIAGNOSIS — Z86010 Personal history of colonic polyps: Secondary | ICD-10-CM | POA: Diagnosis not present

## 2017-06-10 DIAGNOSIS — R1011 Right upper quadrant pain: Secondary | ICD-10-CM | POA: Diagnosis not present

## 2017-06-14 DIAGNOSIS — Z01419 Encounter for gynecological examination (general) (routine) without abnormal findings: Secondary | ICD-10-CM | POA: Diagnosis not present

## 2017-06-15 ENCOUNTER — Ambulatory Visit
Admission: RE | Admit: 2017-06-15 | Discharge: 2017-06-15 | Disposition: A | Discharge status: Home / Self Care | Payer: PPO | Source: Ambulatory Visit | Attending: Gastroenterology | Admitting: Gastroenterology

## 2017-06-15 ENCOUNTER — Encounter (HOSPITAL_COMMUNITY): Payer: Self-pay

## 2017-06-15 ENCOUNTER — Other Ambulatory Visit (HOSPITAL_COMMUNITY): Payer: PPO

## 2017-06-15 DIAGNOSIS — K8020 Calculus of gallbladder without cholecystitis without obstruction: Secondary | ICD-10-CM | POA: Diagnosis not present

## 2017-06-15 DIAGNOSIS — R1031 Right lower quadrant pain: Secondary | ICD-10-CM

## 2017-06-15 MED ORDER — IOPAMIDOL (ISOVUE-300) INJECTION 61%
100.0000 mL | Freq: Once | INTRAVENOUS | Status: AC | PRN
  Administered 2017-06-15: 100 mL via INTRAVENOUS

## 2017-06-29 ENCOUNTER — Ambulatory Visit: Payer: Self-pay | Admitting: General Surgery

## 2017-06-29 DIAGNOSIS — K8020 Calculus of gallbladder without cholecystitis without obstruction: Secondary | ICD-10-CM | POA: Diagnosis not present

## 2017-07-08 DIAGNOSIS — N182 Chronic kidney disease, stage 2 (mild): Secondary | ICD-10-CM | POA: Diagnosis not present

## 2017-07-08 DIAGNOSIS — E1122 Type 2 diabetes mellitus with diabetic chronic kidney disease: Secondary | ICD-10-CM | POA: Diagnosis not present

## 2017-07-08 DIAGNOSIS — I129 Hypertensive chronic kidney disease with stage 1 through stage 4 chronic kidney disease, or unspecified chronic kidney disease: Secondary | ICD-10-CM | POA: Diagnosis not present

## 2017-07-08 DIAGNOSIS — E039 Hypothyroidism, unspecified: Secondary | ICD-10-CM | POA: Diagnosis not present

## 2017-07-08 DIAGNOSIS — N08 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: Secondary | ICD-10-CM | POA: Diagnosis not present

## 2017-07-15 DIAGNOSIS — M7501 Adhesive capsulitis of right shoulder: Secondary | ICD-10-CM | POA: Diagnosis not present

## 2017-07-15 DIAGNOSIS — M542 Cervicalgia: Secondary | ICD-10-CM | POA: Diagnosis not present

## 2017-07-21 DIAGNOSIS — M25611 Stiffness of right shoulder, not elsewhere classified: Secondary | ICD-10-CM | POA: Diagnosis not present

## 2017-07-21 DIAGNOSIS — M6281 Muscle weakness (generalized): Secondary | ICD-10-CM | POA: Diagnosis not present

## 2017-07-21 DIAGNOSIS — M7501 Adhesive capsulitis of right shoulder: Secondary | ICD-10-CM | POA: Diagnosis not present

## 2017-07-21 DIAGNOSIS — M25511 Pain in right shoulder: Secondary | ICD-10-CM | POA: Diagnosis not present

## 2017-07-27 DIAGNOSIS — M25511 Pain in right shoulder: Secondary | ICD-10-CM | POA: Diagnosis not present

## 2017-07-27 DIAGNOSIS — M7501 Adhesive capsulitis of right shoulder: Secondary | ICD-10-CM | POA: Diagnosis not present

## 2017-07-27 DIAGNOSIS — M6281 Muscle weakness (generalized): Secondary | ICD-10-CM | POA: Diagnosis not present

## 2017-07-27 DIAGNOSIS — M25611 Stiffness of right shoulder, not elsewhere classified: Secondary | ICD-10-CM | POA: Diagnosis not present

## 2017-07-29 DIAGNOSIS — M25611 Stiffness of right shoulder, not elsewhere classified: Secondary | ICD-10-CM | POA: Diagnosis not present

## 2017-07-29 DIAGNOSIS — M7501 Adhesive capsulitis of right shoulder: Secondary | ICD-10-CM | POA: Diagnosis not present

## 2017-07-29 DIAGNOSIS — M6281 Muscle weakness (generalized): Secondary | ICD-10-CM | POA: Diagnosis not present

## 2017-07-29 DIAGNOSIS — M25511 Pain in right shoulder: Secondary | ICD-10-CM | POA: Diagnosis not present

## 2017-08-03 ENCOUNTER — Other Ambulatory Visit: Payer: Self-pay | Admitting: Internal Medicine

## 2017-08-03 DIAGNOSIS — M25511 Pain in right shoulder: Secondary | ICD-10-CM | POA: Diagnosis not present

## 2017-08-03 DIAGNOSIS — M25611 Stiffness of right shoulder, not elsewhere classified: Secondary | ICD-10-CM | POA: Diagnosis not present

## 2017-08-03 DIAGNOSIS — E2839 Other primary ovarian failure: Secondary | ICD-10-CM

## 2017-08-03 DIAGNOSIS — M6281 Muscle weakness (generalized): Secondary | ICD-10-CM | POA: Diagnosis not present

## 2017-08-03 DIAGNOSIS — M7501 Adhesive capsulitis of right shoulder: Secondary | ICD-10-CM | POA: Diagnosis not present

## 2017-08-05 DIAGNOSIS — M6281 Muscle weakness (generalized): Secondary | ICD-10-CM | POA: Diagnosis not present

## 2017-08-05 DIAGNOSIS — M25611 Stiffness of right shoulder, not elsewhere classified: Secondary | ICD-10-CM | POA: Diagnosis not present

## 2017-08-05 DIAGNOSIS — M25511 Pain in right shoulder: Secondary | ICD-10-CM | POA: Diagnosis not present

## 2017-08-05 DIAGNOSIS — M7501 Adhesive capsulitis of right shoulder: Secondary | ICD-10-CM | POA: Diagnosis not present

## 2017-08-06 NOTE — Pre-Procedure Instructions (Signed)
MARIADELOSANG WYNNS  08/06/2017      [facility] [address] [place], [place] [address] Phone: [phone] Fax: [phone]    Your procedure is scheduled on 08/16/2017.  Report to [facility] Admitting at [place].  Call this number if you have problems the morning of surgery:  [phone]   Remember:  Do not eat food or drink liquids after midnight.   Continue all medications as directed by your physician except follow these medication instructions before surgery below   Take these medicines the morning of surgery with A SIP OF WATER: Acetaminophen (Tylenol) - if needed Eye drops Levothyroxine (Synthroid)  7 days prior to surgery STOP taking any Aspirin(unless otherwise instructed by your surgeon), Aleve, Naproxen, Ibuprofen, Motrin, Advil, Goody's, BC's, all herbal medications, fish oil, and all vitamins  Follow your doctors instructions regarding your Aspirin.  If no instructions were given by your doctor, then you will need to call the prescribing office office to get instructions.     WHAT DO I DO ABOUT MY DIABETES MEDICATION?  Marland Kitchen Do not take oral diabetes medicines (pills) the morning of surgery - DO NOT take your METFORMIN the morning of surgery.    How to Manage Your Diabetes Before and After Surgery  Why is it important to control my blood sugar before and after surgery? . Improving blood sugar levels before and after surgery helps healing and can limit problems. . A way of improving blood sugar control is eating a healthy diet by: o  Eating less sugar and carbohydrates o  Increasing activity/exercise o  Talking with your doctor about reaching your blood sugar goals . High blood sugars (greater than 180 mg/dL) can raise your risk of infections and slow your recovery, so you will need to focus on controlling your diabetes during the weeks before surgery. . Make sure that the  doctor who takes care of your diabetes knows about your planned surgery including the date and location.  How do I manage my blood sugar before surgery? . Check your blood sugar at least 4 times a day, starting 2 days before surgery, to make sure that the level is not too high or low. o Check your blood sugar the morning of your surgery when you wake up and every 2 hours until you get to the Short Stay unit. . If your blood sugar is less than 70 mg/dL, you will need to treat for low blood sugar: o Do not take insulin. o Treat a low blood sugar (less than 70 mg/dL) with  cup of clear juice (cranberry or apple), 4 glucose tablets, OR glucose gel. Recheck blood sugar in 15 minutes after treatment (to make sure it is greater than 70 mg/dL). If your blood sugar is not greater than 70 mg/dL on recheck, call [phone] o  for further instructions. . Report your blood sugar to the short stay nurse when you get to Short Stay.  . If you are admitted to the hospital after surgery: o Your blood sugar will be checked by the staff and you will probably be given insulin after surgery (instead of oral diabetes medicines) to make sure you have good blood sugar levels. o The goal for blood sugar control after surgery is 80-180 mg/dL.    Do not wear jewelry, make-up or nail polish.  Do not wear lotions, powders, or perfumes, or deodorant.  Do not shave 48 hours  prior to surgery.  Men may shave face and neck.  Do not bring valuables to the hospital.  [facility] is not responsible for any belongings or valuables.  Contacts, dentures or bridgework may not be worn into surgery.  Leave your suitcase in the car.  After surgery it may be brought to your room.  For patients admitted to the hospital, discharge time will be determined by your treatment team.  Patients discharged the day of surgery will not be allowed to drive home.   Name and phone number of your driver:    Special instructions:   [place]-  Preparing For Surgery  Before surgery, you can play an important role. Because skin is not sterile, your skin needs to be as free of germs as possible. You can reduce the number of germs on your skin by washing with CHG (chlorahexidine gluconate) Soap before surgery.  CHG is an antiseptic cleaner which kills germs and bonds with the skin to continue killing germs even after washing.  Please do not use if you have an allergy to CHG or antibacterial soaps. If your skin becomes reddened/irritated stop using the CHG.  Do not shave (including legs and underarms) for at least 48 hours prior to first CHG shower. It is OK to shave your face.  Please follow these instructions carefully.   1. Shower the NIGHT BEFORE SURGERY and the MORNING OF SURGERY with CHG.   2. If you chose to wash your hair, wash your hair first as usual with your normal shampoo.  3. After you shampoo, rinse your hair and body thoroughly to remove the shampoo.  4. Use CHG as you would any other liquid soap. You can apply CHG directly to the skin and wash gently with a scrungie or a clean washcloth.   5. Apply the CHG Soap to your body ONLY FROM THE NECK DOWN.  Do not use on open wounds or open sores. Avoid contact with your eyes, ears, mouth and genitals (private parts). Wash Face and genitals (private parts)  with your normal soap.  6. Wash thoroughly, paying special attention to the area where your surgery will be performed.  7. Thoroughly rinse your body with warm water from the neck down.  8. DO NOT shower/wash with your normal soap after using and rinsing off the CHG Soap.  9. Pat yourself dry with a CLEAN TOWEL.  10. Wear CLEAN PAJAMAS to bed the night before surgery, wear comfortable clothes the morning of surgery  11. Place CLEAN SHEETS on your bed the night of your first shower and DO NOT SLEEP WITH PETS.    Day of Surgery: Shower as stated above. Do not apply any deodorants/lotions.  Please wear clean clothes  to the hospital/surgery center.      Please read over the following fact sheets that you were given.

## 2017-08-09 ENCOUNTER — Other Ambulatory Visit: Payer: Self-pay

## 2017-08-09 ENCOUNTER — Encounter (HOSPITAL_COMMUNITY)
Admission: RE | Admit: 2017-08-09 | Discharge: 2017-08-09 | Disposition: A | Discharge status: Home / Self Care | Payer: PPO | Source: Ambulatory Visit | Attending: General Surgery | Admitting: General Surgery

## 2017-08-09 ENCOUNTER — Ambulatory Visit (HOSPITAL_COMMUNITY)
Admission: RE | Admit: 2017-08-09 | Discharge: 2017-08-09 | Disposition: A | Discharge status: Home / Self Care | Payer: PPO | Source: Ambulatory Visit | Attending: General Surgery | Admitting: General Surgery

## 2017-08-09 ENCOUNTER — Encounter (HOSPITAL_COMMUNITY): Payer: Self-pay

## 2017-08-09 DIAGNOSIS — I440 Atrioventricular block, first degree: Secondary | ICD-10-CM | POA: Diagnosis not present

## 2017-08-09 DIAGNOSIS — J986 Disorders of diaphragm: Secondary | ICD-10-CM | POA: Insufficient documentation

## 2017-08-09 DIAGNOSIS — E119 Type 2 diabetes mellitus without complications: Secondary | ICD-10-CM | POA: Insufficient documentation

## 2017-08-09 DIAGNOSIS — Z01818 Encounter for other preprocedural examination: Secondary | ICD-10-CM | POA: Insufficient documentation

## 2017-08-09 DIAGNOSIS — E0500 Thyrotoxicosis with diffuse goiter without thyrotoxic crisis or storm: Secondary | ICD-10-CM | POA: Diagnosis not present

## 2017-08-09 HISTORY — DX: Unspecified osteoarthritis, unspecified site: M19.90

## 2017-08-09 HISTORY — DX: Anxiety disorder, unspecified: F41.9

## 2017-08-09 LAB — COMPREHENSIVE METABOLIC PANEL
ALT: 21 U/L (ref 14–54)
AST: 29 U/L (ref 15–41)
Albumin: 3.9 g/dL (ref 3.5–5.0)
Alkaline Phosphatase: 43 U/L (ref 38–126)
Anion gap: 11 (ref 5–15)
BUN: 11 mg/dL (ref 6–20)
CO2: 25 mmol/L (ref 22–32)
Calcium: 9.3 mg/dL (ref 8.9–10.3)
Chloride: 102 mmol/L (ref 101–111)
Creatinine, Ser: 0.84 mg/dL (ref 0.44–1.00)
GFR calc Af Amer: >60 mL/min (ref >60)
GFR calc non Af Amer: >60 mL/min (ref >60)
Glucose, Bld: 119 mg/dL — ABNORMAL HIGH (ref 65–99)
Potassium: 3.2 mmol/L — ABNORMAL LOW (ref 3.5–5.1)
Sodium: 138 mmol/L (ref 135–145)
Total Bilirubin: 0.4 mg/dL (ref 0.3–1.2)
Total Protein: 7.5 g/dL (ref 6.5–8.1)

## 2017-08-09 LAB — CBC WITH DIFFERENTIAL/PLATELET
Basophils Absolute: 0 10*3/uL (ref 0.0–0.1)
Basophils Relative: 0 %
Eosinophils Absolute: 0.2 10*3/uL (ref 0.0–0.7)
Eosinophils Relative: 4 %
HCT: 39.4 % (ref 36.0–46.0)
Hemoglobin: 12.8 g/dL (ref 12.0–15.0)
Lymphocytes Relative: 51 %
Lymphs Abs: 2.7 10*3/uL (ref 0.7–4.0)
MCH: 28.6 pg (ref 26.0–34.0)
MCHC: 32.5 g/dL (ref 30.0–36.0)
MCV: 87.9 fL (ref 78.0–100.0)
Monocytes Absolute: 0.3 10*3/uL (ref 0.1–1.0)
Monocytes Relative: 6 %
Neutro Abs: 2.1 10*3/uL (ref 1.7–7.7)
Neutrophils Relative %: 39 %
Platelets: 326 10*3/uL (ref 150–400)
RBC: 4.48 MIL/uL (ref 3.87–5.11)
RDW: 14.1 % (ref 11.5–15.5)
WBC: 5.2 10*3/uL (ref 4.0–10.5)

## 2017-08-09 LAB — GLUCOSE, CAPILLARY: Glucose-Capillary: 137 mg/dL — ABNORMAL HIGH (ref 65–99)

## 2017-08-09 LAB — HEMOGLOBIN A1C
Hgb A1c MFr Bld: 6.6 % — ABNORMAL HIGH (ref 4.8–5.6)
Mean Plasma Glucose: 142.72 mg/dL

## 2017-08-09 NOTE — Progress Notes (Signed)
PCP - Dr. Glendale Chard Cardiologist - patient denies  Chest x-ray - 08/09/2017 EKG - 08/09/2017 Stress Test - patient states it has been within the last 5 years, requesting records from Dr. Baird Cancer office ECHO - patient unsure but feels like it was at the same time as her Stress Test, requesting records from Dr. Baird Cancer office Cardiac Cath - patient denies  Sleep Study - patient denies  Fasting Blood Sugar - 100-110 Checks Blood Sugar __2-3___ times a week  Aspirin Instructions: patient instructed to get instructions on when to stop from prescribing doctor  Anesthesia review: n/a  Patient denies shortness of breath, fever, cough and chest pain at PAT appointment   Patient verbalized understanding of instructions that were given to them at the PAT appointment. Patient was also instructed that they will need to review over the PAT instructions again at home before surgery.

## 2017-08-10 DIAGNOSIS — M25511 Pain in right shoulder: Secondary | ICD-10-CM | POA: Diagnosis not present

## 2017-08-10 DIAGNOSIS — M25611 Stiffness of right shoulder, not elsewhere classified: Secondary | ICD-10-CM | POA: Diagnosis not present

## 2017-08-10 DIAGNOSIS — M7501 Adhesive capsulitis of right shoulder: Secondary | ICD-10-CM | POA: Diagnosis not present

## 2017-08-10 DIAGNOSIS — M6281 Muscle weakness (generalized): Secondary | ICD-10-CM | POA: Diagnosis not present

## 2017-08-12 DIAGNOSIS — M7501 Adhesive capsulitis of right shoulder: Secondary | ICD-10-CM | POA: Diagnosis not present

## 2017-08-12 DIAGNOSIS — M25511 Pain in right shoulder: Secondary | ICD-10-CM | POA: Diagnosis not present

## 2017-08-12 DIAGNOSIS — M25611 Stiffness of right shoulder, not elsewhere classified: Secondary | ICD-10-CM | POA: Diagnosis not present

## 2017-08-12 DIAGNOSIS — M6281 Muscle weakness (generalized): Secondary | ICD-10-CM | POA: Diagnosis not present

## 2017-08-13 NOTE — Progress Notes (Signed)
Left message for medical records at Dr. Lynder Parents office for [facility] tests to be faxed to [phone]

## 2017-08-15 ENCOUNTER — Encounter (HOSPITAL_COMMUNITY): Payer: Self-pay | Admitting: Anesthesiology

## 2017-08-15 NOTE — Anesthesia Preprocedure Evaluation (Addendum)
Anesthesia Evaluation  Patient identified by MRN, date of birth, ID band Patient awake    Reviewed: Allergy & Precautions, NPO status , Patient's Chart, lab work & pertinent test results  Airway Mallampati: I       Dental no notable dental hx. (+) Teeth Intact   Pulmonary neg pulmonary ROS,    Pulmonary exam normal breath sounds clear to auscultation       Cardiovascular hypertension, Pt. on medications Normal cardiovascular exam Rhythm:Regular Rate:Normal     Neuro/Psych    GI/Hepatic negative GI ROS, Neg liver ROS,   Endo/Other  diabetes, Oral Hypoglycemic AgentsHyperthyroidism   Renal/GU negative Renal ROS     Musculoskeletal   Abdominal Normal abdominal exam  (+)   Peds  Hematology negative hematology ROS (+)   Anesthesia Other Findings   Reproductive/Obstetrics                            Anesthesia Physical Anesthesia Plan  ASA: II  Anesthesia Plan: General   Post-op Pain Management:    Induction: Intravenous  PONV Risk Score and Plan: 4 or greater and Ondansetron, Dexamethasone, Midazolam and Scopolamine patch - Pre-op  Airway Management Planned: Oral ETT  Additional Equipment:   Intra-op Plan:   Post-operative Plan: Extubation in OR  Informed Consent: I have reviewed the patients History and Physical, chart, labs and discussed the procedure including the risks, benefits and alternatives for the proposed anesthesia with the patient or authorized representative who has indicated his/her understanding and acceptance.   Dental advisory given  Plan Discussed with: CRNA and Surgeon  Anesthesia Plan Comments:        Anesthesia Quick Evaluation

## 2017-08-16 ENCOUNTER — Ambulatory Visit (HOSPITAL_COMMUNITY): Payer: PPO | Admitting: Anesthesiology

## 2017-08-16 ENCOUNTER — Encounter (HOSPITAL_COMMUNITY): Payer: Self-pay | Admitting: Anesthesiology

## 2017-08-16 ENCOUNTER — Ambulatory Visit (HOSPITAL_COMMUNITY)
Admission: RE | Admit: 2017-08-16 | Discharge: 2017-08-16 | Disposition: A | Discharge status: Home / Self Care | Payer: PPO | Source: Ambulatory Visit | Attending: General Surgery | Admitting: General Surgery

## 2017-08-16 ENCOUNTER — Encounter (HOSPITAL_COMMUNITY)
Admission: RE | Disposition: A | Discharge status: Home / Self Care | Payer: Self-pay | Source: Ambulatory Visit | Attending: General Surgery

## 2017-08-16 DIAGNOSIS — E119 Type 2 diabetes mellitus without complications: Secondary | ICD-10-CM | POA: Diagnosis not present

## 2017-08-16 DIAGNOSIS — K8010 Calculus of gallbladder with chronic cholecystitis without obstruction: Secondary | ICD-10-CM | POA: Diagnosis not present

## 2017-08-16 DIAGNOSIS — Z79891 Long term (current) use of opiate analgesic: Secondary | ICD-10-CM | POA: Insufficient documentation

## 2017-08-16 DIAGNOSIS — E7849 Other hyperlipidemia: Secondary | ICD-10-CM | POA: Diagnosis not present

## 2017-08-16 DIAGNOSIS — K8020 Calculus of gallbladder without cholecystitis without obstruction: Secondary | ICD-10-CM | POA: Diagnosis present

## 2017-08-16 DIAGNOSIS — Z7982 Long term (current) use of aspirin: Secondary | ICD-10-CM | POA: Insufficient documentation

## 2017-08-16 DIAGNOSIS — E7800 Pure hypercholesterolemia, unspecified: Secondary | ICD-10-CM | POA: Insufficient documentation

## 2017-08-16 DIAGNOSIS — I10 Essential (primary) hypertension: Secondary | ICD-10-CM | POA: Insufficient documentation

## 2017-08-16 DIAGNOSIS — Z79890 Hormone replacement therapy (postmenopausal): Secondary | ICD-10-CM | POA: Insufficient documentation

## 2017-08-16 DIAGNOSIS — Z79899 Other long term (current) drug therapy: Secondary | ICD-10-CM | POA: Insufficient documentation

## 2017-08-16 DIAGNOSIS — Z7984 Long term (current) use of oral hypoglycemic drugs: Secondary | ICD-10-CM | POA: Diagnosis not present

## 2017-08-16 HISTORY — PX: CHOLECYSTECTOMY: SHX55

## 2017-08-16 LAB — GLUCOSE, CAPILLARY
Glucose-Capillary: 121 mg/dL — ABNORMAL HIGH (ref 65–99)
Glucose-Capillary: 161 mg/dL — ABNORMAL HIGH (ref 65–99)

## 2017-08-16 SURGERY — LAPAROSCOPIC CHOLECYSTECTOMY
Anesthesia: General | Site: Abdomen

## 2017-08-16 MED ORDER — KETOROLAC TROMETHAMINE 30 MG/ML IJ SOLN
INTRAMUSCULAR | Status: AC
  Filled 2017-08-16: qty 1

## 2017-08-16 MED ORDER — SUGAMMADEX SODIUM 200 MG/2ML IV SOLN
INTRAVENOUS | Status: DC | PRN
  Administered 2017-08-16: 200 mg via INTRAVENOUS

## 2017-08-16 MED ORDER — CEFOTETAN DISODIUM-DEXTROSE 2-2.08 GM-%(50ML) IV SOLR
2.0000 g | INTRAVENOUS | Status: AC
  Administered 2017-08-16: 2 g via INTRAVENOUS
  Filled 2017-08-16: qty 50

## 2017-08-16 MED ORDER — MEPERIDINE HCL 50 MG/ML IJ SOLN: 6.2500 mg | INTRAMUSCULAR | Status: DC | PRN

## 2017-08-16 MED ORDER — GLYCOPYRROLATE 0.2 MG/ML IJ SOLN
INTRAMUSCULAR | Status: DC | PRN
  Administered 2017-08-16: 0.2 mg via INTRAVENOUS

## 2017-08-16 MED ORDER — SUGAMMADEX SODIUM 200 MG/2ML IV SOLN
INTRAVENOUS | Status: AC
  Filled 2017-08-16: qty 2

## 2017-08-16 MED ORDER — ACETAMINOPHEN 325 MG PO TABS: 325.0000 mg | ORAL_TABLET | ORAL | Status: DC | PRN

## 2017-08-16 MED ORDER — CELECOXIB 200 MG PO CAPS
200.0000 mg | ORAL_CAPSULE | ORAL | Status: AC
  Administered 2017-08-16: 200 mg via ORAL
  Filled 2017-08-16: qty 1

## 2017-08-16 MED ORDER — 0.9 % SODIUM CHLORIDE (POUR BTL) OPTIME
TOPICAL | Status: DC | PRN
  Administered 2017-08-16: 1000 mL

## 2017-08-16 MED ORDER — PROPOFOL 10 MG/ML IV BOLUS
INTRAVENOUS | Status: DC | PRN
  Administered 2017-08-16: 150 mg via INTRAVENOUS

## 2017-08-16 MED ORDER — KETOROLAC TROMETHAMINE 30 MG/ML IJ SOLN
30.0000 mg | Freq: Once | INTRAMUSCULAR | Status: DC | PRN
  Administered 2017-08-16: 30 mg via INTRAVENOUS

## 2017-08-16 MED ORDER — BUPIVACAINE-EPINEPHRINE 0.25% -1:200000 IJ SOLN
INTRAMUSCULAR | Status: DC | PRN
  Administered 2017-08-16: 10 mL

## 2017-08-16 MED ORDER — ACETAMINOPHEN 160 MG/5ML PO SOLN: 325.0000 mg | ORAL | Status: DC | PRN

## 2017-08-16 MED ORDER — FENTANYL CITRATE (PF) 100 MCG/2ML IJ SOLN
INTRAMUSCULAR | Status: DC | PRN
  Administered 2017-08-16: 150 ug via INTRAVENOUS

## 2017-08-16 MED ORDER — PHENYLEPHRINE 40 MCG/ML (10ML) SYRINGE FOR IV PUSH (FOR BLOOD PRESSURE SUPPORT)
PREFILLED_SYRINGE | INTRAVENOUS | Status: AC
  Filled 2017-08-16: qty 10

## 2017-08-16 MED ORDER — FENTANYL CITRATE (PF) 250 MCG/5ML IJ SOLN
INTRAMUSCULAR | Status: AC
  Filled 2017-08-16: qty 5

## 2017-08-16 MED ORDER — GABAPENTIN 300 MG PO CAPS
300.0000 mg | ORAL_CAPSULE | ORAL | Status: AC
  Administered 2017-08-16: 300 mg via ORAL
  Filled 2017-08-16: qty 1

## 2017-08-16 MED ORDER — SODIUM CHLORIDE 0.9 % IV SOLN
INTRAVENOUS | Status: DC | PRN
  Administered 2017-08-16: .1 mL

## 2017-08-16 MED ORDER — PHENYLEPHRINE HCL 10 MG/ML IJ SOLN
INTRAMUSCULAR | Status: DC | PRN
  Administered 2017-08-16 (×3): 80 ug via INTRAVENOUS

## 2017-08-16 MED ORDER — EPHEDRINE 5 MG/ML INJ
INTRAVENOUS | Status: AC
  Filled 2017-08-16: qty 10

## 2017-08-16 MED ORDER — ONDANSETRON HCL 4 MG/2ML IJ SOLN
INTRAMUSCULAR | Status: AC
  Filled 2017-08-16: qty 2

## 2017-08-16 MED ORDER — LIDOCAINE HCL (CARDIAC) 20 MG/ML IV SOLN
INTRAVENOUS | Status: AC
  Filled 2017-08-16: qty 5

## 2017-08-16 MED ORDER — DEXAMETHASONE SODIUM PHOSPHATE 10 MG/ML IJ SOLN
INTRAMUSCULAR | Status: AC
  Filled 2017-08-16: qty 1

## 2017-08-16 MED ORDER — LACTATED RINGERS IV SOLN
INTRAVENOUS | Status: DC | PRN
  Administered 2017-08-16 (×2): via INTRAVENOUS

## 2017-08-16 MED ORDER — OXYCODONE HCL 5 MG/5ML PO SOLN: 5.0000 mg | Freq: Once | ORAL | Status: DC | PRN

## 2017-08-16 MED ORDER — ACETAMINOPHEN 500 MG PO TABS
1000.0000 mg | ORAL_TABLET | ORAL | Status: AC
  Administered 2017-08-16: 1000 mg via ORAL
  Filled 2017-08-16: qty 2

## 2017-08-16 MED ORDER — FENTANYL CITRATE (PF) 100 MCG/2ML IJ SOLN: 25.0000 ug | INTRAMUSCULAR | Status: DC | PRN

## 2017-08-16 MED ORDER — SODIUM CHLORIDE 0.9 % IR SOLN
Status: DC | PRN
  Administered 2017-08-16: 1000 mL

## 2017-08-16 MED ORDER — BUPIVACAINE-EPINEPHRINE (PF) 0.25% -1:200000 IJ SOLN
INTRAMUSCULAR | Status: AC
Start: 2017-08-16 — End: 2017-08-16
  Filled 2017-08-16: qty 30

## 2017-08-16 MED ORDER — LIDOCAINE HCL (CARDIAC) 20 MG/ML IV SOLN
INTRAVENOUS | Status: DC | PRN
  Administered 2017-08-16: 100 mg via INTRAVENOUS

## 2017-08-16 MED ORDER — NEOSTIGMINE METHYLSULFATE 5 MG/5ML IV SOSY
PREFILLED_SYRINGE | INTRAVENOUS | Status: AC
  Filled 2017-08-16: qty 5

## 2017-08-16 MED ORDER — CHLORHEXIDINE GLUCONATE CLOTH 2 % EX PADS: 6.0000 | MEDICATED_PAD | Freq: Once | CUTANEOUS | Status: DC

## 2017-08-16 MED ORDER — ROCURONIUM BROMIDE 100 MG/10ML IV SOLN
INTRAVENOUS | Status: DC | PRN
  Administered 2017-08-16: 40 mg via INTRAVENOUS

## 2017-08-16 MED ORDER — MIDAZOLAM HCL 2 MG/2ML IJ SOLN
INTRAMUSCULAR | Status: AC
Start: 2017-08-16 — End: 2017-08-16
  Filled 2017-08-16: qty 2

## 2017-08-16 MED ORDER — DEXAMETHASONE SODIUM PHOSPHATE 10 MG/ML IJ SOLN
INTRAMUSCULAR | Status: DC | PRN
  Administered 2017-08-16: 10 mg via INTRAVENOUS

## 2017-08-16 MED ORDER — SCOPOLAMINE 1 MG/3DAYS TD PT72
MEDICATED_PATCH | TRANSDERMAL | Status: AC
  Filled 2017-08-16: qty 1

## 2017-08-16 MED ORDER — EPHEDRINE SULFATE 50 MG/ML IJ SOLN
INTRAMUSCULAR | Status: DC | PRN
  Administered 2017-08-16: 10 mg via INTRAVENOUS
  Administered 2017-08-16: 15 mg via INTRAVENOUS
  Administered 2017-08-16: 10 mg via INTRAVENOUS

## 2017-08-16 MED ORDER — PROPOFOL 10 MG/ML IV BOLUS
INTRAVENOUS | Status: AC
  Filled 2017-08-16: qty 20

## 2017-08-16 MED ORDER — TRAMADOL HCL 50 MG PO TABS: 50.0000 mg | ORAL_TABLET | Freq: Four times a day (QID) | ORAL | 0 refills | Status: DC | PRN

## 2017-08-16 MED ORDER — ROCURONIUM BROMIDE 10 MG/ML (PF) SYRINGE
PREFILLED_SYRINGE | INTRAVENOUS | Status: AC
  Filled 2017-08-16: qty 5

## 2017-08-16 MED ORDER — OXYCODONE HCL 5 MG PO TABS: 5.0000 mg | ORAL_TABLET | Freq: Once | ORAL | Status: DC | PRN

## 2017-08-16 MED ORDER — ONDANSETRON HCL 4 MG/2ML IJ SOLN
INTRAMUSCULAR | Status: DC | PRN
  Administered 2017-08-16: 4 mg via INTRAVENOUS

## 2017-08-16 MED ORDER — IOPAMIDOL (ISOVUE-300) INJECTION 61%
INTRAVENOUS | Status: AC
  Filled 2017-08-16: qty 50

## 2017-08-16 MED ORDER — ONDANSETRON HCL 4 MG/2ML IJ SOLN
4.0000 mg | Freq: Once | INTRAMUSCULAR | Status: DC | PRN
Start: 2017-08-16 — End: 2017-08-16

## 2017-08-16 MED ORDER — MIDAZOLAM HCL 5 MG/5ML IJ SOLN
INTRAMUSCULAR | Status: DC | PRN
  Administered 2017-08-16: 2 mg via INTRAVENOUS

## 2017-08-16 SURGICAL SUPPLY — 38 items
APPLIER CLIP 5 13 M/L LIGAMAX5 (MISCELLANEOUS) ×3
BLADE CLIPPER SURG (BLADE) IMPLANT
CANISTER SUCT 3000ML PPV (MISCELLANEOUS) ×3 IMPLANT
CHLORAPREP W/TINT 26ML (MISCELLANEOUS) ×3 IMPLANT
CLIP APPLIE 5 13 M/L LIGAMAX5 (MISCELLANEOUS) ×2 IMPLANT
COVER MAYO STAND STRL (DRAPES) ×3 IMPLANT
COVER SURGICAL LIGHT HANDLE (MISCELLANEOUS) ×3 IMPLANT
DERMABOND ADVANCED (GAUZE/BANDAGES/DRESSINGS) ×1
DERMABOND ADVANCED .7 DNX12 (GAUZE/BANDAGES/DRESSINGS) ×2 IMPLANT
DRAPE C-ARM 42X72 X-RAY (DRAPES) IMPLANT
DRSG TEGADERM 2-3/8X2-3/4 SM (GAUZE/BANDAGES/DRESSINGS) ×12 IMPLANT
ELECT REM PT RETURN 9FT ADLT (ELECTROSURGICAL) ×3
ELECTRODE REM PT RTRN 9FT ADLT (ELECTROSURGICAL) ×2 IMPLANT
GLOVE BIOGEL PI IND STRL 8 (GLOVE) ×2 IMPLANT
GLOVE BIOGEL PI INDICATOR 8 (GLOVE) ×1
GLOVE ECLIPSE 7.5 STRL STRAW (GLOVE) ×3 IMPLANT
GOWN STRL REUS W/ TWL LRG LVL3 (GOWN DISPOSABLE) ×6 IMPLANT
GOWN STRL REUS W/TWL LRG LVL3 (GOWN DISPOSABLE) ×3
KIT BASIN OR (CUSTOM PROCEDURE TRAY) ×3 IMPLANT
KIT ROOM TURNOVER OR (KITS) ×3 IMPLANT
NS IRRIG 1000ML POUR BTL (IV SOLUTION) ×3 IMPLANT
PAD ARMBOARD 7.5X6 YLW CONV (MISCELLANEOUS) ×3 IMPLANT
POUCH RETRIEVAL ECOSAC 10 (ENDOMECHANICALS) ×2 IMPLANT
POUCH RETRIEVAL ECOSAC 10MM (ENDOMECHANICALS) ×1
SCISSORS LAP 5X35 DISP (ENDOMECHANICALS) ×3 IMPLANT
SET CHOLANGIOGRAPH 5 50 .035 (SET/KITS/TRAYS/PACK) IMPLANT
SET IRRIG TUBING LAPAROSCOPIC (IRRIGATION / IRRIGATOR) ×3 IMPLANT
SLEEVE ENDOPATH XCEL 5M (ENDOMECHANICALS) ×6 IMPLANT
SPECIMEN JAR SMALL (MISCELLANEOUS) ×3 IMPLANT
STRIP CLOSURE SKIN 1/2X4 (GAUZE/BANDAGES/DRESSINGS) ×3 IMPLANT
SUT MNCRL AB 4-0 PS2 18 (SUTURE) ×3 IMPLANT
TOWEL OR 17X24 6PK STRL BLUE (TOWEL DISPOSABLE) ×3 IMPLANT
TOWEL OR 17X26 10 PK STRL BLUE (TOWEL DISPOSABLE) ×3 IMPLANT
TRAY LAPAROSCOPIC MC (CUSTOM PROCEDURE TRAY) ×3 IMPLANT
TROCAR XCEL BLUNT TIP 100MML (ENDOMECHANICALS) ×3 IMPLANT
TROCAR XCEL NON-BLD 5MMX100MML (ENDOMECHANICALS) ×3 IMPLANT
TUBING INSUFFLATION (TUBING) ×3 IMPLANT
WATER STERILE IRR 1000ML POUR (IV SOLUTION) ×3 IMPLANT

## 2017-08-16 NOTE — Discharge Instructions (Addendum)
Laparoscopic Cholecystectomy, Care After This sheet gives you information about how to care for yourself after your procedure. Your doctor may also give you more specific instructions. If you have problems or questions, contact your doctor. Follow these instructions at home: Care for cuts from surgery (incisions)   Follow instructions from your doctor about how to take care of your cuts from surgery. Make sure you: ? Wash your hands with soap and water before you change your bandage (dressing). If you cannot use soap and water, use hand sanitizer. ? Change your bandage as told by your doctor. ? Leave stitches (sutures), skin glue, or skin tape (adhesive) strips in place. They may need to stay in place for 2 weeks or longer. If tape strips get loose and curl up, you may trim the loose edges. Do not remove tape strips completely unless your doctor says it is okay.  Do not take baths, swim, or use a hot tub until your doctor says it is okay. Ask your doctor if you can take showers. You may only be allowed to take sponge baths for bathing.  Check your surgical cut area every day for signs of infection. Check for: ? More redness, swelling, or pain. ? More fluid or blood. ? Warmth. ? Pus or a bad smell. Activity  Do not drive or use heavy machinery while taking prescription pain medicine.  Do not lift anything that is heavier than 10 lb (4.5 kg) until your doctor says it is okay.  Do not play contact sports until your doctor says it is okay.  Do not drive for 24 hours if you were given a medicine to help you relax (sedative).  Rest as needed. Do not return to work or school until your doctor says it is okay. General instructions  Take over-the-counter and prescription medicines only as told by your doctor.  To prevent or treat constipation while you are taking prescription pain medicine, your doctor may recommend that you: ? Drink enough fluid to keep your pee (urine) clear or pale  yellow. ? Take over-the-counter or prescription medicines. ? Eat foods that are high in fiber, such as fresh fruits and vegetables, whole grains, and beans. ? Limit foods that are high in fat and processed sugars, such as fried and sweet foods. ? Leave dressings intact until seen in clinic Contact a doctor if:  You develop a rash.  You have more redness, swelling, or pain around your surgical cuts.  You have more fluid or blood coming from your surgical cuts.  Your surgical cuts feel warm to the touch.  You have pus or a bad smell coming from your surgical cuts.  You have a fever.  One or more of your surgical cuts breaks open. Get help right away if:  You have trouble breathing.  You have chest pain.  You have pain that is getting worse in your shoulders.  You faint or feel dizzy when you stand.  You have very bad pain in your belly (abdomen).  You are sick to your stomach (nauseous) for more than one day.  You have throwing up (vomiting) that lasts for more than one day.  You have leg pain. This information is not intended to replace advice given to you by your health care provider. Make sure you discuss any questions you have with your health care provider.  Kathryne Eriksson. Dahlia Bailiff, MD, [place] [phone] [facility] Surgery

## 2017-08-16 NOTE — Anesthesia Postprocedure Evaluation (Signed)
Anesthesia Post Note  Patient: Brooke Brown  Procedure(s) Performed: LAPAROSCOPIC CHOLECYSTECTOMY (N/A Abdomen)     Patient location during evaluation: PACU Anesthesia Type: General Level of consciousness: awake Pain management: pain level controlled Respiratory status: spontaneous breathing Cardiovascular status: stable Postop Assessment: no apparent nausea or vomiting Anesthetic complications: no    Last Vitals:  Vitals:   08/16/17 0940 08/16/17 0945  BP: 116/64 122/64  Pulse: 77 74  Resp: 14 14  Temp: (!) 36.2 C   SpO2: 95% 96%    Last Pain:  Vitals:   08/16/17 0945  TempSrc:   PainSc: 0-No pain   Pain Goal: Patients Stated Pain Goal: 1 (08/16/17 4268)               HATCHETT JR,JOHN Mateo Flow

## 2017-08-16 NOTE — H&P (Signed)
Brooke Brown Documented: 06/29/2017 9:35 AM Location: [facility] Patient #: [id number] DOB: 1950/09/29 Undefined / Language: Cleophus Molt / Race: Black or African American Female   History of Present Illness Kathryne Eriksson. Wyatt MD; 06/29/2017 10:43 AM) Patient words: Gallstones seen on recent CT scan done for abdominal pain in the RUQ . Symmptoms were RUQ pain radiating to the right side after eating a garden, grilled chicken salad wit rach dressing. Symptoms lasted all night after awakening her at 2:00AM. this is the worst episode that happened in December.  The patient is a 67 year old female.   Past Surgical History Levonne Spiller, CMA; 06/29/2017 9:36 AM) Cataract Surgery  Bilateral. Hysterectomy (not due to cancer) - Partial  Tonsillectomy   Diagnostic Studies History Levonne Spiller, CMA; 06/29/2017 9:36 AM) Colonoscopy  1-5 years ago Mammogram  within last year Pap Smear  1-5 years ago  Allergies Levonne Spiller, [employer]; 06/29/2017 9:36 AM) No Known Allergies [06/29/2017]: Allergies Reconciled   Medication History Levonne Spiller, CMA; 06/29/2017 9:38 AM) MetFORMIN HCl (500MG  Tablet, 1 Oral daily) Active. Synthroid (112MCG Tablet, 1 Oral daily) Active. Livalo (2MG  Tablet, 1 Oral daily) Active. Olmesartan-Amlodipine-HCTZ (Oral) Specific strength unknown - Active. Medications Reconciled  Social History Andee Poles Education officer, [employer], CMA; 06/29/2017 9:36 AM) Alcohol use  Occasional alcohol use. Caffeine use  Coffee, Tea. No drug use  Tobacco use  Never smoker.  Family History Levonne Spiller, CMA; 06/29/2017 9:36 AM) Diabetes Mellitus  Brother, Mother. Heart Disease  Father. Hypertension  Brother, Daughter, Father, Mother. Malignant Neoplasm Of Pancreas  Mother.  Pregnancy / Birth History Levonne Spiller, [place]; 06/29/2017 9:36 AM) Age at menarche  1 years. Age of menopause  89-55 Gravida  2 Maternal age  73-25 Para  33  Other  Problems Levonne Spiller, CMA; 06/29/2017 9:36 AM) Anxiety Disorder  Arthritis  Back Pain  Diabetes Mellitus  High blood pressure  Hypercholesterolemia  Thyroid Disease     Review of Systems Andee Poles Gerrigner CMA; 06/29/2017 9:36 AM) General Present- Night Sweats. Not Present- Appetite Loss, Chills, Fatigue, Fever, Weight Gain and Weight Loss. Skin Not Present- Change in Wart/Mole, Dryness, Hives, Jaundice, New Lesions, Non-Healing Wounds, Rash and Ulcer. HEENT Present- Wears glasses/contact lenses. Not Present- Earache, Hearing Loss, Hoarseness, Nose Bleed, Oral Ulcers, Ringing in the Ears, Seasonal Allergies, Sinus Pain, Sore Throat, Visual Disturbances and Yellow Eyes. Respiratory Not Present- Bloody sputum, Chronic Cough, Difficulty Breathing, Snoring and Wheezing. Breast Not Present- Breast Mass, Breast Pain, Nipple Discharge and Skin Changes. Cardiovascular Not Present- Chest Pain, Difficulty Breathing Lying Down, Leg Cramps, Palpitations, Rapid Heart Rate, Shortness of Breath and Swelling of Extremities. Gastrointestinal Present- Abdominal Pain, Constipation, Gets full quickly at meals and Nausea. Not Present- Bloating, Bloody Stool, Change in Bowel Habits, Chronic diarrhea, Difficulty Swallowing, Excessive gas, Hemorrhoids, Indigestion, Rectal Pain and Vomiting. Female Genitourinary Not Present- Frequency, Nocturia, Painful Urination, Pelvic Pain and Urgency. Musculoskeletal Present- Back Pain, Joint Pain and Muscle Pain. Not Present- Joint Stiffness, Muscle Weakness and Swelling of Extremities. Neurological Not Present- Decreased Memory, Fainting, Headaches, Numbness, Seizures, Tingling, Tremor, Trouble walking and Weakness. Psychiatric Present- Anxiety. Not Present- Bipolar, Change in Sleep Pattern, Depression, Fearful and Frequent crying. Endocrine Present- Hot flashes. Not Present- Cold Intolerance, Excessive Hunger, Hair Changes, Heat Intolerance and New  Diabetes. Hematology Not Present- Blood Thinners, Easy Bruising, Excessive bleeding, Gland problems, HIV and Persistent Infections.  Vitals (Danielle Gerrigner CMA; 06/29/2017 9:36 AM) 06/29/2017 9:36 AM Weight: 171.25 lb Height: 65in Height was reported by patient. Body Surface Area:  1.85 m Body Mass Index: 28.5 kg/m  Temp.: 98.80F(Oral)  Pulse: 87 (Regular)  BP: 130/80 (Sitting, Left Arm, Standard)       Physical Exam (James O. Hulen Skains MD; 06/29/2017 10:45 AM) General Mental Status-Alert. General Appearance-Cooperative and Well groomed. Orientation-Oriented X4. Build & Nutrition-Lean. Health Status-General Health Good.  Chest and Lung Exam Chest and lung exam reveals -normal excursion with symmetric chest walls, normal resonance, no flatness or dullness, non-tender and normal tactile fremitus and on auscultation, normal breath sounds, no adventitious sounds and normal vocal resonance.  Cardiovascular Cardiovascular examination reveals -on palpation PMI is normal in location and amplitude, no palpable S3 or S4. Normal cardiac borders., normal heart sounds, regular rate and rhythm with no murmurs, (see Vital Signs section for blood pressure measurements) and femoral artery auscultation bilaterally reveals normal pulses, no bruits, no thrills.  Abdomen Inspection Inspection of the abdomen reveals - No Visible peristalsis. Palpation/Percussion Tenderness - Right Upper Quadrant(very mild but possible Murphy's sign). Auscultation Auscultation of the abdomen reveals - Bowel sounds normal.    Assessment & Plan Jeneen Rinks O. Wyatt MD; 06/29/2017 10:46 AM) SYMPTOMATIC CHOLELITHIASIS (K80.20) Impression: Symptomatic cholelithiaswis with one single large gallstone noted on CT scan done in December 2018. Needs surgical removal of her gall bladder . Large stone with calcification. Current Plans:  Patient has not had any symmptoms.  Doing well.  Plan for lap chole,  possible [place]. Dahlia Bailiff, MD, [place] [phone] [facility]

## 2017-08-16 NOTE — Op Note (Signed)
OPERATIVE REPORT  DATE OF OPERATION: 08/16/2017  PATIENT:  Brooke Brown  67 y.o. female  PRE-OPERATIVE DIAGNOSIS:  Symptomatic cholelithiasis  POST-OPERATIVE DIAGNOSIS:  Symptomatic cholelithiasis  INDICATION(S) FOR OPERATION:  Pain related to known gallstones  FINDINGS:  Chronic cholecystitis and cholelithiasis  PROCEDURE:  Procedure(s): LAPAROSCOPIC CHOLECYSTECTOMY  SURGEON:  Surgeon(s): Judeth Horn, MD  ASSISTANTOk Edwards, MS  ANESTHESIA:   general  COMPLICATIONS:  None  EBL: <20 ml  BLOOD ADMINISTERED: none  DRAINS: none   SPECIMEN:  Source of Specimen:  Gallbladder and contents  COUNTS CORRECT:  YES  PROCEDURE DETAILS: The patient was taken to the operating room and placed on the table in the supine position.  After an adequate endotracheal anesthetic was administered, the patient was prepped with ChloroPrep, and then draped in the usual manner exposing the entire abdomen laterally, inferiorly and up  to the costal margins.  After a proper timeout was performed including identifying the patient and the procedure to be performed, a supraumbilical 4.4RX midline incision was made using a #15 blade.  This was taken down to the fascia which was then incised with a #15 blade.  The edges of the fascia were tented up with Kocher clamps as the preperitoneal space was penetrated with a Kelly clamp into the peritoneum.  Once this was done, a pursestring suture of 0 Vicryl was passed around the fascial opening.  This was subsequently used to secure the [facility] cannula which was passed into the peritoneal cavity.  Once the [facility] cannula was in place, carbon dioxide gas was insufflated into the peritoneal cavity up to a maximal intra-abdominal pressure of 41mm Hg.The laparoscope, with attached camera and light source, was passed into the peritoneal cavity to visualize the direct insertion of two right upper quadrant 70mm cannulas, and a sup-xiphoid 55mm cannula.  Once all cannulas  were in place, the dissection was begun.  Two ratcheted graspers were attached to the dome and infundibulum of the gallbladder and retracted towards the anterior abdominal wall and the right upper quadrant.  Using cautery attached to a dissecting forceps, the peritoneum overlaying the triangle of Chalot and the hepatoduodenal triangle was dissected away exposing the cystic duct and the cystic artery.  A critical window was developed between the CBD and the cystic duct The cystic artery was clipped proximally and distally then transected.  A clip was placed on the gallbladder side of the cystic duct, then  the distal cystic duct was clipped three times then transected between the clips.  The gallbladder was then dissected out of the hepatic bed without event.  It was retrieved from the abdomen using an EcoPouch bag without event.  Once the gallbladder was removed, the bed was inspected for hemostasis.  Once excellent hemostasis was obtained all gas and fluids were aspirated from above the liver, then the cannulas were removed.  The supraumbilical incision was closed using the pursestring suture which was in place.  0.25% bupivicaine with epinephrine was injected at all sites.  All 44mm or greater cannula sites were close using a running subcuticular stitch of 4-0 Monocryl.  5.55mm cannula sites were closed with Dermabond only.Steri-Strips and Tagaderm were used to complete the dressings at all sites.  At this point all needle, sponge, and instrument counts were correct.The patient was awakened from anesthesia and taken to the PACU in stable condition.  Kathryne Eriksson. Dahlia Bailiff, MD, [employer] [phone] [facility]  PATIENT DISPOSITION:  PACU - hemodynamically stable.   Judeth Horn  3/25/20198:47 AM

## 2017-08-16 NOTE — Transfer of Care (Signed)
Immediate Anesthesia Transfer of Care Note  Patient: Brooke Brown  Procedure(s) Performed: LAPAROSCOPIC CHOLECYSTECTOMY (N/A Abdomen)  Patient Location: PACU  Anesthesia Type:General  Level of Consciousness: awake, alert , oriented and patient cooperative  Airway & Oxygen Therapy: Patient Spontanous Breathing and Patient connected to nasal cannula oxygen  Post-op Assessment: Report given to RN and Post -op Vital signs reviewed and stable  Post vital signs: Reviewed and stable  Last Vitals:  Vitals Value Taken Time  BP 118/61 08/16/2017  8:54 AM  Temp 36.4 C 08/16/2017  8:53 AM  Pulse 86 08/16/2017  8:57 AM  Resp 23 08/16/2017  8:57 AM  SpO2 99 % 08/16/2017  8:57 AM  Vitals shown include unvalidated device data.  Last Pain:  Vitals:   08/16/17 0611  TempSrc:   PainSc: 2       Patients Stated Pain Goal: 1 (86/76/72 0947)  Complications: No apparent anesthesia complications

## 2017-08-17 ENCOUNTER — Encounter (HOSPITAL_COMMUNITY): Payer: Self-pay | Admitting: General Surgery

## 2017-11-08 DIAGNOSIS — E039 Hypothyroidism, unspecified: Secondary | ICD-10-CM | POA: Diagnosis not present

## 2017-11-08 DIAGNOSIS — E1122 Type 2 diabetes mellitus with diabetic chronic kidney disease: Secondary | ICD-10-CM | POA: Diagnosis not present

## 2017-11-08 DIAGNOSIS — Z1389 Encounter for screening for other disorder: Secondary | ICD-10-CM | POA: Diagnosis not present

## 2017-11-08 DIAGNOSIS — I129 Hypertensive chronic kidney disease with stage 1 through stage 4 chronic kidney disease, or unspecified chronic kidney disease: Secondary | ICD-10-CM | POA: Diagnosis not present

## 2017-11-08 DIAGNOSIS — N182 Chronic kidney disease, stage 2 (mild): Secondary | ICD-10-CM | POA: Diagnosis not present

## 2017-11-08 DIAGNOSIS — N08 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: Secondary | ICD-10-CM | POA: Diagnosis not present

## 2017-11-08 LAB — BASIC METABOLIC PANEL
BUN: 13 (ref 4–21)
Creatinine: 0.8 (ref 0.5–1.1)
Glucose: 105
Potassium: 3.9 (ref 3.4–5.3)
Sodium: 143 (ref 137–147)

## 2017-11-08 LAB — HEMOGLOBIN A1C: Hgb A1c MFr Bld: 6.7 — AB (ref 4.0–6.0)

## 2017-11-08 LAB — TSH: TSH: 1.2 (ref 0.41–5.90)

## 2017-11-11 DIAGNOSIS — H26491 Other secondary cataract, right eye: Secondary | ICD-10-CM | POA: Diagnosis not present

## 2017-11-11 DIAGNOSIS — E1139 Type 2 diabetes mellitus with other diabetic ophthalmic complication: Secondary | ICD-10-CM | POA: Diagnosis not present

## 2017-11-11 DIAGNOSIS — E0500 Thyrotoxicosis with diffuse goiter without thyrotoxic crisis or storm: Secondary | ICD-10-CM | POA: Diagnosis not present

## 2017-11-11 DIAGNOSIS — H04123 Dry eye syndrome of bilateral lacrimal glands: Secondary | ICD-10-CM | POA: Diagnosis not present

## 2017-12-06 DIAGNOSIS — M79645 Pain in left finger(s): Secondary | ICD-10-CM | POA: Diagnosis not present

## 2017-12-20 DIAGNOSIS — B079 Viral wart, unspecified: Secondary | ICD-10-CM | POA: Diagnosis not present

## 2017-12-28 ENCOUNTER — Other Ambulatory Visit: Payer: Self-pay | Admitting: Internal Medicine

## 2017-12-28 DIAGNOSIS — Z1231 Encounter for screening mammogram for malignant neoplasm of breast: Secondary | ICD-10-CM

## 2018-02-09 ENCOUNTER — Ambulatory Visit
Admission: RE | Admit: 2018-02-09 | Discharge: 2018-02-09 | Disposition: A | Discharge status: Home / Self Care | Payer: PPO | Source: Ambulatory Visit | Attending: Internal Medicine | Admitting: Internal Medicine

## 2018-02-09 DIAGNOSIS — Z780 Asymptomatic menopausal state: Secondary | ICD-10-CM | POA: Diagnosis not present

## 2018-02-09 DIAGNOSIS — Z1231 Encounter for screening mammogram for malignant neoplasm of breast: Secondary | ICD-10-CM

## 2018-02-09 DIAGNOSIS — E2839 Other primary ovarian failure: Secondary | ICD-10-CM

## 2018-02-09 DIAGNOSIS — Z13820 Encounter for screening for osteoporosis: Secondary | ICD-10-CM | POA: Diagnosis not present

## 2018-02-19 ENCOUNTER — Encounter: Payer: Self-pay | Admitting: Internal Medicine

## 2018-03-10 ENCOUNTER — Ambulatory Visit (INDEPENDENT_AMBULATORY_CARE_PROVIDER_SITE_OTHER): Payer: PPO | Admitting: Internal Medicine

## 2018-03-10 ENCOUNTER — Encounter: Payer: Self-pay | Admitting: Internal Medicine

## 2018-03-10 VITALS — BP 126/80 | HR 74 | Temp 98.3°F | Ht 64.0 in | Wt 168.6 lb

## 2018-03-10 DIAGNOSIS — H9313 Tinnitus, bilateral: Secondary | ICD-10-CM

## 2018-03-10 DIAGNOSIS — Z7982 Long term (current) use of aspirin: Secondary | ICD-10-CM

## 2018-03-10 DIAGNOSIS — I129 Hypertensive chronic kidney disease with stage 1 through stage 4 chronic kidney disease, or unspecified chronic kidney disease: Secondary | ICD-10-CM | POA: Diagnosis not present

## 2018-03-10 DIAGNOSIS — N182 Chronic kidney disease, stage 2 (mild): Secondary | ICD-10-CM | POA: Diagnosis not present

## 2018-03-10 DIAGNOSIS — H6123 Impacted cerumen, bilateral: Secondary | ICD-10-CM | POA: Diagnosis not present

## 2018-03-10 DIAGNOSIS — Z23 Encounter for immunization: Secondary | ICD-10-CM | POA: Diagnosis not present

## 2018-03-10 DIAGNOSIS — E1122 Type 2 diabetes mellitus with diabetic chronic kidney disease: Secondary | ICD-10-CM | POA: Diagnosis not present

## 2018-03-10 MED ORDER — METFORMIN HCL ER 500 MG PO TB24: 500.0000 mg | ORAL_TABLET | Freq: Two times a day (BID) | ORAL | 2 refills | Status: DC

## 2018-03-10 NOTE — Progress Notes (Signed)
Subjective:     Patient ID: Brooke Brown , female    DOB: November 27, 1950 , 67 y.o.   MRN: [id number]   Diabetes  She presents for her follow-up diabetic visit. She has type 2 diabetes mellitus. Her disease course has been stable. There are no hypoglycemic associated symptoms. Pertinent negatives for diabetes include no chest pain, no fatigue, no foot paresthesias, no polydipsia, no polyphagia and no polyuria. There are no hypoglycemic complications. Diabetic complications include nephropathy.  Hypertension  This is a chronic problem. The current episode started more than 1 year ago. The problem is controlled. Pertinent negatives include no chest pain.   Reports compliance with meds.   Past Medical History:  Diagnosis Date  . Anxiety   . Arthritis   . Diabetes mellitus   . Grave's disease   . Grave's disease   . Hyperlipemia   . Hypertension       Current Outpatient Medications:  .  acetaminophen (TYLENOL) 500 MG tablet, Take 500 mg by mouth daily as needed for moderate pain or headache., Disp: , Rfl:  .  aspirin EC 81 MG tablet, Take 81 mg by mouth daily., Disp: , Rfl:  .  calcium carbonate (OSCAL) 1500 (600 Ca) MG TABS tablet, Take 600 mg of elemental calcium by mouth daily., Disp: , Rfl:  .  Carboxymethylcellul-Glycerin (LUBRICATING EYE DROPS OP), Place 1 drop into both eyes 2 (two) times daily., Disp: , Rfl:  .  Cholecalciferol (VITAMIN D3) 5000 units CAPS, Take 5,000 Units by mouth daily., Disp: , Rfl:  .  levothyroxine (SYNTHROID, LEVOTHROID) 112 MCG tablet, Take 112 mcg by mouth daily before breakfast., Disp: , Rfl:  .  metFORMIN (GLUCOPHAGE-XR) 500 MG 24 hr tablet, Take 1 tablet (500 mg total) by mouth 2 (two) times daily., Disp: 180 tablet, Rfl: 2 .  Multiple Vitamin (MULTIVITAMIN WITH MINERALS) TABS tablet, Take 1 tablet by mouth daily., Disp: , Rfl:  .  Olmesartan-amLODIPine-HCTZ 40-5-25 MG TABS, Take 1 tablet by mouth daily., Disp: , Rfl:  .  Pitavastatin Calcium (LIVALO)  2 MG TABS, Take 2 mg by mouth daily., Disp: , Rfl:  .  traMADol (ULTRAM) 50 MG tablet, Take 1 tablet (50 mg total) by mouth every 6 (six) hours as needed for moderate pain or severe pain., Disp: 30 tablet, Rfl: 0 .  hydrochlorothiazide (HYDRODIURIL) 25 MG tablet, Take 1 tablet (25 mg total) by mouth daily., Disp: 90 tablet, Rfl: 1 .  olmesartan (BENICAR) 40 MG tablet, Take 1 tablet (40 mg total) by mouth daily., Disp: 90 tablet, Rfl: 1   No Known Allergies   Review of Systems  Constitutional: Negative.  Negative for fatigue.  HENT: Negative.   Eyes: Negative.   Respiratory: Negative.   Cardiovascular: Negative.  Negative for chest pain.  Gastrointestinal: Negative.   Endocrine: Negative for polydipsia, polyphagia and polyuria.  Musculoskeletal: Negative.   Psychiatric/Behavioral: Negative.      Today's Vitals   03/10/18 1134  BP: 126/80  Pulse: 74  Temp: 98.3 F (36.8 C)  TempSrc: Oral  Weight: 168 lb 9.6 oz (76.5 kg)  Height: 5' 4"  (1.626 m)   Body mass index is 28.94 kg/m.   Objective:  Physical Exam  Constitutional: She is oriented to person, place, and time. She appears well-developed and well-nourished.  HENT:  Head: Normocephalic and atraumatic.  Right Ear: Hearing and external ear normal.  Left Ear: Hearing and external ear normal.  B/l cerumen impaction  Eyes: EOM are normal.  Cardiovascular:  Normal rate, regular rhythm and normal heart sounds.  Pulmonary/Chest: Effort normal and breath sounds normal.  Neurological: She is alert and oriented to person, place, and time.  Psychiatric: She has a normal mood and affect.  Nursing note and vitals reviewed.       Assessment And Plan:     1. Type 2 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 disease, without long-term current use of insulin ([place])  I will check labs as listed below.  Importance of regular exercise was discussed to the patient.  - CMP14+EGFR - Hemoglobin A1c - Lipid Profile  2. Chronic renal  disease, stage II  Chronic. She is encouraged to stay well hydrated.   3. Benign hypertensive renal disease  Well controlled. She will continue with current meds. She is encouraged to avoid adding salt to her foods.   4. Tinnitus, bilateral  Possibly due to b/l ear impaction. She will let me know if her symptoms persist.  5. Need for immunization against influenza  - Flu vaccine HIGH DOSE PF (Fluzone High dose)  6. Bilateral impacted cerumen  After obtaining verbal consent, both ears were flushed by irrigation. She had no complications with the procedure.  NO TM abnormalities were noted.   - Ear Lavage   [name], MD

## 2018-03-10 NOTE — Patient Instructions (Signed)

## 2018-03-11 ENCOUNTER — Other Ambulatory Visit: Payer: Self-pay

## 2018-03-11 LAB — CMP14+EGFR
ALT: 14 IU/L (ref 0–32)
AST: 21 IU/L (ref 0–40)
Albumin/Globulin Ratio: 1.4 (ref 1.2–2.2)
Albumin: 4.3 g/dL (ref 3.6–4.8)
Alkaline Phosphatase: 51 IU/L (ref 39–117)
BUN/Creatinine Ratio: 17 (ref 12–28)
BUN: 13 mg/dL (ref 8–27)
Bilirubin Total: <0.2 mg/dL (ref 0.0–1.2)
CO2: 25 mmol/L (ref 20–29)
Calcium: 10.1 mg/dL (ref 8.7–10.3)
Chloride: 103 mmol/L (ref 96–106)
Creatinine, Ser: 0.78 mg/dL (ref 0.57–1.00)
GFR calc Af Amer: 90 mL/min/{1.73_m2} (ref >59)
GFR calc non Af Amer: 78 mL/min/{1.73_m2} (ref >59)
Globulin, Total: 3.1 g/dL (ref 1.5–4.5)
Glucose: 119 mg/dL — ABNORMAL HIGH (ref 65–99)
Potassium: 4.3 mmol/L (ref 3.5–5.2)
Sodium: 145 mmol/L — ABNORMAL HIGH (ref 134–144)
Total Protein: 7.4 g/dL (ref 6.0–8.5)

## 2018-03-11 LAB — LIPID PANEL
Chol/HDL Ratio: 3.5 ratio (ref 0.0–4.4)
Cholesterol, Total: 171 mg/dL (ref 100–199)
HDL: 49 mg/dL (ref >39)
LDL Calculated: 75 mg/dL (ref 0–99)
Triglycerides: 235 mg/dL — ABNORMAL HIGH (ref 0–149)
VLDL Cholesterol Cal: 47 mg/dL — ABNORMAL HIGH (ref 5–40)

## 2018-03-11 LAB — HEMOGLOBIN A1C
Est. average glucose Bld gHb Est-mCnc: 146 mg/dL
Hgb A1c MFr Bld: 6.7 % — ABNORMAL HIGH (ref 4.8–5.6)

## 2018-03-11 MED ORDER — HYDROCHLOROTHIAZIDE 25 MG PO TABS: 25.0000 mg | ORAL_TABLET | Freq: Every day | ORAL | 1 refills | Status: DC

## 2018-03-11 MED ORDER — OLMESARTAN MEDOXOMIL 40 MG PO TABS: 40.0000 mg | ORAL_TABLET | Freq: Every day | ORAL | 1 refills | Status: DC

## 2018-03-12 NOTE — Progress Notes (Signed)
Your liver and kidney function are stable. It appears you need to increase your water intake because your sodium level was elevated. Your a1c is great at 6.7. Your LDL, bad cholesterol is near goal at 75. However, your triglycerides are elevated - this implies you are eating too many processed foods and/or not getting enough exercise. Strive to exercise for no less than 18minutes per week.

## 2018-03-23 ENCOUNTER — Encounter: Payer: Self-pay | Admitting: Internal Medicine

## 2018-04-07 ENCOUNTER — Other Ambulatory Visit: Payer: Self-pay | Admitting: Internal Medicine

## 2018-04-14 DIAGNOSIS — E1139 Type 2 diabetes mellitus with other diabetic ophthalmic complication: Secondary | ICD-10-CM | POA: Diagnosis not present

## 2018-04-14 DIAGNOSIS — H26491 Other secondary cataract, right eye: Secondary | ICD-10-CM | POA: Diagnosis not present

## 2018-04-14 DIAGNOSIS — H43813 Vitreous degeneration, bilateral: Secondary | ICD-10-CM | POA: Diagnosis not present

## 2018-04-14 DIAGNOSIS — H04123 Dry eye syndrome of bilateral lacrimal glands: Secondary | ICD-10-CM | POA: Diagnosis not present

## 2018-04-14 LAB — HM DIABETES EYE EXAM

## 2018-05-12 ENCOUNTER — Other Ambulatory Visit: Payer: Self-pay | Admitting: Internal Medicine

## 2018-07-02 ENCOUNTER — Other Ambulatory Visit: Payer: Self-pay

## 2018-07-02 ENCOUNTER — Encounter: Payer: Self-pay | Admitting: Family Medicine

## 2018-07-02 ENCOUNTER — Ambulatory Visit
Admission: EM | Admit: 2018-07-02 | Discharge: 2018-07-02 | Disposition: A | Discharge status: Home / Self Care | Payer: PPO | Attending: Family Medicine | Admitting: Family Medicine

## 2018-07-02 DIAGNOSIS — I10 Essential (primary) hypertension: Secondary | ICD-10-CM | POA: Diagnosis not present

## 2018-07-02 DIAGNOSIS — Z7689 Persons encountering health services in other specified circumstances: Secondary | ICD-10-CM | POA: Diagnosis not present

## 2018-07-02 DIAGNOSIS — R42 Dizziness and giddiness: Secondary | ICD-10-CM | POA: Diagnosis not present

## 2018-07-02 LAB — POCT URINALYSIS DIP (MANUAL ENTRY)
Bilirubin, UA: NEGATIVE
Glucose, UA: NEGATIVE mg/dL
Ketones, POC UA: NEGATIVE mg/dL
Leukocytes, UA: NEGATIVE
Nitrite, UA: NEGATIVE
Protein Ur, POC: NEGATIVE mg/dL
Spec Grav, UA: <=1.005 — AB (ref 1.010–1.025)
Urobilinogen, UA: 0.2 E.U./dL
pH, UA: 5 (ref 5.0–8.0)

## 2018-07-02 LAB — BASIC METABOLIC PANEL
BUN/Creatinine Ratio: 14 (ref 12–28)
BUN: 11 mg/dL (ref 8–27)
CO2: 22 mmol/L (ref 20–29)
Calcium: 9.6 mg/dL (ref 8.7–10.3)
Chloride: 99 mmol/L (ref 96–106)
Creatinine, Ser: 0.8 mg/dL (ref 0.57–1.00)
GFR calc Af Amer: 87 mL/min/{1.73_m2} (ref >59)
GFR calc non Af Amer: 75 mL/min/{1.73_m2} (ref >59)
Glucose: 112 mg/dL — ABNORMAL HIGH (ref 65–99)
Potassium: 3.9 mmol/L (ref 3.5–5.2)
Sodium: 139 mmol/L (ref 134–144)

## 2018-07-02 NOTE — Discharge Instructions (Addendum)
Your urinalysis was normal here today Your EKG was normal Your neurological exam was normal and there is no concern for stroke Your lab results are still pending and we will call you with any abnormal results. The elevated blood pressure could be due to the pain that you have been experiencing. I would like for you to follow-up with your doctor next week for recheck Make sure you are monitoring your blood pressure at home If you start experiencing any severe symptoms to include numbness, tingling, weakness, slurred speech, severe dizziness, blurred vision, severe headache, nausea, vomiting you need to go the hospital.

## 2018-07-02 NOTE — ED Provider Notes (Addendum)
Brooke Brown    CSN: [id number] Arrival date & time: 07/02/18  1218     History   Chief Complaint Chief Complaint  Patient presents with  . Hypertension    HPI Brooke Brown is a 68 y.o. female.   Is a 68 year old female with past medical history of anxiety, arthritis, diabetes, Graves' disease, hyperlipidemia, hypertension.  She presents with increases in her blood pressure for the past week.  She has been taking her blood pressure medication as prescribed by her primary Brown doctor.  She started this medication a few months back.  She is reporting a slight headache on Wednesday that was resolved with Tylenol.  She has had some intermittent dizziness but denies blurred vision, numbness, tingling, weakness.  She checked her blood pressure at [facility] and it was 170/90.  This concerned her.  Her blood pressures are typically normal.  She denies any chest pain, shortness of breath.  She is also had some mild mid/lower back pain.  Describes this pain has dull and aching.   Patient has chronic right wrist pain and is wearing a brace on the wrist.  Was told she may need surgery or steroid injection.  She has been taking one Tylenol a day with mild relief.  rates pain 4 out of 10  ROS per HPI      Past Medical History:  Diagnosis Date  . Anxiety   . Arthritis   . Diabetes mellitus   . Grave's disease   . Grave's disease   . Hyperlipemia   . Hypertension     Patient Active Problem List   Diagnosis Date Noted  . Right wrist pain 09/30/2015  . Unspecified essential hypertension 12/20/2013  . Other and unspecified hyperlipidemia 12/20/2013  . Diabetes type 2, controlled ([place]) 12/20/2013  . Neuralgia 12/20/2013  . Neck pain 12/20/2013  . Dizziness 12/20/2013    Past Surgical History:  Procedure Laterality Date  . ABDOMINAL HYSTERECTOMY    . CATARACT EXTRACTION W/ INTRAOCULAR LENS  IMPLANT, BILATERAL Bilateral   . CHOLECYSTECTOMY N/A 08/16/2017   Procedure:  LAPAROSCOPIC CHOLECYSTECTOMY;  Surgeon: Judeth Horn, MD;  Location: [facility];  Service: General;  Laterality: N/A;  . TUBAL LIGATION      OB History   No obstetric history on file.      Home Medications    Prior to Admission medications   Medication Sig Start Date End Date Taking? Authorizing Provider  aspirin EC 81 MG tablet Take 81 mg by mouth daily.   Yes [provider]  calcium carbonate (OSCAL) 1500 (600 Ca) MG TABS tablet Take 600 mg of elemental calcium by mouth daily.   Yes [provider]  Carboxymethylcellul-Glycerin (LUBRICATING EYE DROPS OP) Place 1 drop into both eyes 2 (two) times daily.   Yes [provider]  Cholecalciferol (VITAMIN D3) 5000 units CAPS Take 5,000 Units by mouth daily.   Yes [provider]  hydrochlorothiazide (HYDRODIURIL) 25 MG tablet Take 1 tablet (25 mg total) by mouth daily. 03/11/18  Yes Glendale Chard, MD  Multiple Vitamin (MULTIVITAMIN WITH MINERALS) TABS tablet Take 1 tablet by mouth daily.   Yes [provider]  [facility] DELICA LANCETS 58I MISC USE AS DIRECTED TO CHECK BLOOD SUGARS 1 TIME PER DAY 05/13/18  Yes Glendale Chard, MD  Pitavastatin Calcium (LIVALO) 2 MG TABS Take 2 mg by mouth daily.   Yes [provider]  SYNTHROID 112 MCG tablet TAKE 1 TABLET BY MOUTH ONCE DAILY 04/08/18  Yes Glendale Chard, MD  acetaminophen (TYLENOL) 500 MG tablet Take 500 mg by mouth daily as needed for moderate pain or headache.    [provider]  metFORMIN (GLUCOPHAGE-XR) 500 MG 24 hr tablet Take 1 tablet (500 mg total) by mouth 2 (two) times daily. 03/10/18 06/08/18  Glendale Chard, MD  olmesartan (BENICAR) 40 MG tablet Take 1 tablet (40 mg total) by mouth daily. 03/11/18   Glendale Chard, MD    Family History Family History  Problem Relation Age of Onset  . Pancreatic cancer Mother   . Heart failure Father   . Diabetes Brother   . Breast cancer Cousin     Social History Social History    Tobacco Use  . Smoking status: Never Smoker  . Smokeless tobacco: Never Used  Substance Use Topics  . Alcohol use: Yes    Alcohol/week: 0.0 standard drinks    Comment: rarely  . Drug use: No     Allergies   Patient has no known allergies.   Review of Systems Review of Systems   Physical Exam Triage Vital Signs ED Triage Vitals  Enc Vitals Group     BP 07/02/18 1232 (!) 168/84     Pulse Rate 07/02/18 1232 78     Resp 07/02/18 1232 16     Temp 07/02/18 1232 98.3 F (36.8 C)     Temp Source 07/02/18 1232 Oral     SpO2 07/02/18 1232 96 %     Weight 07/02/18 1233 154 lb (69.9 kg)     Height 07/02/18 1233 5\' 5"  (1.651 m)     Head Circumference --      Peak Flow --      Pain Score 07/02/18 1232 3     Pain Loc --      Pain Edu? --      Excl. in [place]? --    No data found.  Updated Vital Signs BP (!) 168/84 (BP Location: Left Arm)   Pulse 78   Temp 98.3 F (36.8 C) (Oral)   Resp 16   Ht 5\' 5"  (1.651 m)   Wt 154 lb (69.9 kg)   SpO2 96%   BMI 25.63 kg/m   Visual Acuity Right Eye Distance:   Left Eye Distance:   Bilateral Distance:    Right Eye Near:   Left Eye Near:    Bilateral Near:     Physical Exam Vitals signs and nursing note reviewed.  Constitutional:      General: She is not in acute distress.    Appearance: She is well-developed. She is not ill-appearing.  HENT:     Head: Normocephalic and atraumatic.     Right Ear: Tympanic membrane and ear canal normal.     Left Ear: Tympanic membrane and ear canal normal.     Mouth/Throat:     Pharynx: Oropharynx is clear.  Eyes:     Conjunctiva/sclera: Conjunctivae normal.  Neck:     Musculoskeletal: Normal range of motion and neck supple.  Cardiovascular:     Rate and Rhythm: Normal rate and regular rhythm.     Heart sounds: No murmur.  Pulmonary:     Effort: Pulmonary effort is normal. No respiratory distress.     Breath sounds: Normal breath sounds.  Abdominal:     Palpations: Abdomen is soft.      Tenderness: There is no abdominal tenderness.  Musculoskeletal: Normal range of motion.     Comments: Brace on right wrist  Skin:  General: Skin is warm and dry.  Neurological:     General: No focal deficit present.     Mental Status: She is alert.     Cranial Nerves: No cranial nerve deficit.     Sensory: No sensory deficit.     Motor: No weakness.     Coordination: Coordination normal.     Gait: Gait normal.  Psychiatric:        Mood and Affect: Mood normal.      UC Treatments / Results  Labs (all labs ordered are listed, but only abnormal results are displayed) Labs Reviewed  POCT URINALYSIS DIP (MANUAL ENTRY) - Abnormal; Notable for the following components:      Result Value   Spec Grav, UA <=1.005 (*)    Blood, UA trace-lysed (*)    All other components within normal limits  BASIC METABOLIC PANEL    EKG None  Radiology No results found.  Procedures Procedures (including critical Brown time)  Medications Ordered in UC Medications - No data to display  Initial Impression / Assessment and Plan / UC Course  I have reviewed the triage vital signs and the nursing notes.  Pertinent labs & imaging results that were available during my Brown of the patient were reviewed by me and considered in my medical decision making (see chart for details).     Patient is a 68 year old female that comes in today with concerns of elevated blood pressure over the past week.  She has had mild frontal headache that is relieved with Tylenol.  She is also had some bright mid/lower back pain described as aching.  Denies any urinary symptoms. Throughout the week she has had some intermittent dizziness.  No blurred vision, nausea, vomiting, weakness, slurred speech, numbness or tingling.  Exam today was completely normal She is neurologically intact There is no concerns for CVA.  Her urine was mostly normal with trace blood. We are sending a CMP on her to check kidney  function. EKG showed mild bradycardia with first-degree AV block. No ACS Vital signs stable, nontoxic or ill-appearing.  Explained everything to patient and instructed that I will call with any positive or abnormal results of the CMP. Otherwise she needs to follow-up with her doctor next week and keep monitoring her blood pressures Recommended trying Aleve for her pain versus Tylenol to see if this helps relieve her pain If the pain is causing her increase in blood pressure as this may decrease her blood pressures Patient understanding and agree to plan Final Clinical Impressions(s) / UC Diagnoses   Final diagnoses:  Essential hypertension, benign     Discharge Instructions     Your urinalysis was normal here today Your EKG was normal Your neurological exam was normal and there is no concern for stroke Your lab results are still pending and we will call you with any abnormal results. The elevated blood pressure could be due to the pain that you have been experiencing. I would like for you to follow-up with your doctor next week for recheck Make sure you are monitoring your blood pressure at home If you start experiencing any severe symptoms to include numbness, tingling, weakness, slurred speech, severe dizziness, blurred vision, severe headache, nausea, vomiting you need to go the hospital.     ED Prescriptions    None     Controlled Substance Prescriptions [place] Controlled Substance Registry consulted? Not Applicable   Orvan July, NP 07/02/18 1404    Loura Halt A, NP 07/02/18  1406  

## 2018-07-02 NOTE — ED Triage Notes (Signed)
Per pt she has been noticing an increase in her blood pressure for the past week. Has been taking her medication that was prescribed by primary. Have been having some slight headache on Wednesday. No visual change. No numbness or tingling.

## 2018-07-05 ENCOUNTER — Telehealth (HOSPITAL_COMMUNITY): Payer: Self-pay | Admitting: Emergency Medicine

## 2018-07-05 NOTE — Telephone Encounter (Signed)
Labs normal. Attempted to contact patient with results. No answer

## 2018-07-06 ENCOUNTER — Ambulatory Visit (INDEPENDENT_AMBULATORY_CARE_PROVIDER_SITE_OTHER): Payer: PPO | Admitting: Internal Medicine

## 2018-07-06 ENCOUNTER — Encounter: Payer: Self-pay | Admitting: Internal Medicine

## 2018-07-06 VITALS — BP 136/78 | HR 80 | Temp 98.3°F | Ht 65.0 in | Wt 167.8 lb

## 2018-07-06 DIAGNOSIS — N182 Chronic kidney disease, stage 2 (mild): Secondary | ICD-10-CM

## 2018-07-06 DIAGNOSIS — I129 Hypertensive chronic kidney disease with stage 1 through stage 4 chronic kidney disease, or unspecified chronic kidney disease: Secondary | ICD-10-CM | POA: Diagnosis not present

## 2018-07-06 DIAGNOSIS — R51 Headache: Secondary | ICD-10-CM

## 2018-07-06 DIAGNOSIS — Z1211 Encounter for screening for malignant neoplasm of colon: Secondary | ICD-10-CM

## 2018-07-06 MED ORDER — AMLODIPINE BESYLATE 5 MG PO TABS: 5.0000 mg | ORAL_TABLET | Freq: Every day | ORAL | 1 refills | Status: DC

## 2018-07-06 NOTE — Progress Notes (Addendum)
Subjective:     Patient ID: Brooke Brown , female    DOB: 1950-06-25 , 68 y.o.   MRN: [id number]   Chief Complaint  Patient presents with  . Hypertension  . Headache    HPI  She is here today for f/u elevated blood pressure. She presented to [facility] on 2/8 for further evaluation of elevated blood pressure and headaches.  She reports days prior to her visit she had been having a low-grade headache that was relieved with Tylenol.  Her home reading was in the 170s/90s, she went to Urgent care who then transferred her to the ER. Acute processes were ruled out and she was sent home in stable condition. She is unable to identify any changes in her routine; therefore, unable to determine why her blood pressures are not controlled.   Hypertension  Associated symptoms include headaches.  Headache   Her past medical history is significant for hypertension.     Past Medical History:  Diagnosis Date  . Anxiety   . Arthritis   . Diabetes mellitus   . Grave's disease   . Grave's disease   . Hyperlipemia   . Hypertension      Family History  Problem Relation Age of Onset  . Pancreatic cancer Mother   . Heart failure Father   . Diabetes Brother   . Breast cancer Cousin      Current Outpatient Medications:  .  acetaminophen (TYLENOL) 500 MG tablet, Take 500 mg by mouth daily as needed for moderate pain or headache., Disp: , Rfl:  .  aspirin EC 81 MG tablet, Take 81 mg by mouth daily., Disp: , Rfl:  .  calcium carbonate (OSCAL) 1500 (600 Ca) MG TABS tablet, Take 600 mg of elemental calcium by mouth daily., Disp: , Rfl:  .  Carboxymethylcellul-Glycerin (LUBRICATING EYE DROPS OP), Place 1 drop into both eyes 2 (two) times daily., Disp: , Rfl:  .  Cholecalciferol (VITAMIN D3) 5000 units CAPS, Take 5,000 Units by mouth daily., Disp: , Rfl:  .  hydrochlorothiazide (HYDRODIURIL) 25 MG tablet, Take 1 tablet (25 mg total) by mouth daily., Disp: 90 tablet, Rfl: 1 .  metFORMIN  (GLUCOPHAGE-XR) 500 MG 24 hr tablet, Take 1 tablet (500 mg total) by mouth 2 (two) times daily., Disp: 180 tablet, Rfl: 2 .  Multiple Vitamin (MULTIVITAMIN WITH MINERALS) TABS tablet, Take 1 tablet by mouth daily., Disp: , Rfl:  .  olmesartan (BENICAR) 40 MG tablet, Take 1 tablet (40 mg total) by mouth daily., Disp: 90 tablet, Rfl: 1 .  ONETOUCH DELICA LANCETS 78G MISC, USE AS DIRECTED TO CHECK BLOOD SUGARS 1 TIME PER DAY, Disp: 100 each, Rfl: 11 .  Pitavastatin Calcium (LIVALO) 2 MG TABS, Take 2 mg by mouth daily., Disp: , Rfl:  .  SYNTHROID 112 MCG tablet, TAKE 1 TABLET BY MOUTH ONCE DAILY, Disp: 30 tablet, Rfl: 3 .  amLODipine (NORVASC) 5 MG tablet, Take 1 tablet (5 mg total) by mouth daily., Disp: 30 tablet, Rfl: 1   No Known Allergies   Review of Systems  Constitutional: Negative.   Respiratory: Negative.   Cardiovascular: Negative.   Gastrointestinal: Negative.   Neurological: Positive for headaches.  Psychiatric/Behavioral: Negative.      Today's Vitals   07/06/18 0849  BP: 136/78  Pulse: 80  Temp: 98.3 F (36.8 C)  TempSrc: Oral  Weight: 167 lb 12.8 oz (76.1 kg)  Height: 5\' 5"  (1.651 m)  PainSc: 3   PainLoc: Head  Body mass index is 27.92 kg/m.   Objective:  Physical Exam Vitals signs and nursing note reviewed.  Constitutional:      Appearance: She is well-developed.  HENT:     Head: Normocephalic.  Cardiovascular:     Heart sounds: Normal heart sounds.  Pulmonary:     Effort: Pulmonary effort is normal.     Breath sounds: Normal breath sounds.  Neurological:     Mental Status: She is alert and oriented to person, place, and time.  Psychiatric:        Mood and Affect: Mood normal.         Assessment And Plan:     1. Parenchymal renal hypertension, stage 1 through stage 4 or unspecified chronic kidney disease  Uncontrolled. I will add amlodipine 5mg  in the evenings to her regimen. I do think elevated bp is the cause of her headaches.  After further  discussion, she admits that she has not been exercising regularly. She is encouraged to incorporate more exercise into her daily routine. She is to aim for 30 minutes five days weekly. She will rto in 4 weeks for re-evaluation. She is encouraged to call the office sooner if needed.   2. Chronic renal disease, stage II  Chronic.  3. Screen for colon cancer  Her last study was in May 2015. I will refer her to GI for CRC screening, she is scheduled to have them every five years.    [name], MD

## 2018-07-06 NOTE — Patient Instructions (Signed)
Diabetes Mellitus and Exercise Exercising regularly is important for your overall health, especially when you have diabetes (diabetes mellitus). Exercising is not only about losing weight. It has many other health benefits, such as increasing muscle strength and bone density and reducing body fat and stress. This leads to improved fitness, flexibility, and endurance, all of which result in better overall health. Exercise has additional benefits for people with diabetes, including:  Reducing appetite.  Helping to lower and control blood glucose.  Lowering blood pressure.  Helping to control amounts of fatty substances (lipids) in the blood, such as cholesterol and triglycerides.  Helping the body to respond better to insulin (improving insulin sensitivity).  Reducing how much insulin the body needs.  Decreasing the risk for heart disease by: ? Lowering cholesterol and triglyceride levels. ? Increasing the levels of good cholesterol. ? Lowering blood glucose levels. What is my activity plan? Your health care provider or certified diabetes educator can help you make a plan for the type and frequency of exercise (activity plan) that works for you. Make sure that you:  Do at least 150 minutes of moderate-intensity or vigorous-intensity exercise each week. This could be brisk walking, biking, or water aerobics. ? Do stretching and strength exercises, such as yoga or weightlifting, at least 2 times a week. ? Spread out your activity over at least 3 days of the week.  Get some form of physical activity every day. ? Do not go more than 2 days in a row without some kind of physical activity. ? Avoid being inactive for more than 30 minutes at a time. Take frequent breaks to walk or stretch.  Choose a type of exercise or activity that you enjoy, and set realistic goals.  Start slowly, and gradually increase the intensity of your exercise over time. What do I need to know about managing my  diabetes?   Check your blood glucose before and after exercising. ? If your blood glucose is 240 mg/dL (13.3 mmol/L) or higher before you exercise, check your urine for ketones. If you have ketones in your urine, do not exercise until your blood glucose returns to normal. ? If your blood glucose is 100 mg/dL (5.6 mmol/L) or lower, eat a snack containing 15-20 grams of carbohydrate. Check your blood glucose 15 minutes after the snack to make sure that your level is above 100 mg/dL (5.6 mmol/L) before you start your exercise.  Know the symptoms of low blood glucose (hypoglycemia) and how to treat it. Your risk for hypoglycemia increases during and after exercise. Common symptoms of hypoglycemia can include: ? Hunger. ? Anxiety. ? Sweating and feeling clammy. ? Confusion. ? Dizziness or feeling light-headed. ? Increased heart rate or palpitations. ? Blurry vision. ? Tingling or numbness around the mouth, lips, or tongue. ? Tremors or shakes. ? Irritability.  Keep a rapid-acting carbohydrate snack available before, during, and after exercise to help prevent or treat hypoglycemia.  Avoid injecting insulin into areas of the body that are going to be exercised. For example, avoid injecting insulin into: ? The arms, when playing tennis. ? The legs, when jogging.  Keep records of your exercise habits. Doing this can help you and your health care provider adjust your diabetes management plan as needed. Write down: ? Food that you eat before and after you exercise. ? Blood glucose levels before and after you exercise. ? The type and amount of exercise you have done. ? When your insulin is expected to peak, if you use   insulin. Avoid exercising at times when your insulin is peaking.  When you start a new exercise or activity, work with your health care provider to make sure the activity is safe for you, and to adjust your insulin, medicines, or food intake as needed.  Drink plenty of water while  you exercise to prevent dehydration or heat stroke. Drink enough fluid to keep your urine clear or pale yellow. Summary  Exercising regularly is important for your overall health, especially when you have diabetes (diabetes mellitus).  Exercising has many health benefits, such as increasing muscle strength and bone density and reducing body fat and stress.  Your health care provider or certified diabetes educator can help you make a plan for the type and frequency of exercise (activity plan) that works for you.  When you start a new exercise or activity, work with your health care provider to make sure the activity is safe for you, and to adjust your insulin, medicines, or food intake as needed. This information is not intended to replace advice given to you by your health care provider. Make sure you discuss any questions you have with your health care provider. Document Released: 08/01/2003 Document Revised: 11/19/2016 Document Reviewed: 10/21/2015 Elsevier Interactive Patient Education  2019 [employer]  

## 2018-07-21 ENCOUNTER — Ambulatory Visit: Payer: Medicare Other | Admitting: Internal Medicine

## 2018-07-21 ENCOUNTER — Ambulatory Visit: Payer: Medicare Other

## 2018-07-21 ENCOUNTER — Encounter: Payer: Medicare Other | Admitting: Internal Medicine

## 2018-07-22 ENCOUNTER — Other Ambulatory Visit: Payer: Self-pay

## 2018-07-22 MED ORDER — PITAVASTATIN CALCIUM 2 MG PO TABS: 2.0000 mg | ORAL_TABLET | Freq: Every day | ORAL | 1 refills | Status: DC

## 2018-08-03 ENCOUNTER — Ambulatory Visit (INDEPENDENT_AMBULATORY_CARE_PROVIDER_SITE_OTHER): Payer: PPO

## 2018-08-03 ENCOUNTER — Encounter: Payer: Self-pay | Admitting: Internal Medicine

## 2018-08-03 ENCOUNTER — Other Ambulatory Visit: Payer: Self-pay

## 2018-08-03 ENCOUNTER — Ambulatory Visit (INDEPENDENT_AMBULATORY_CARE_PROVIDER_SITE_OTHER): Payer: PPO | Admitting: Internal Medicine

## 2018-08-03 VITALS — BP 114/66 | HR 91 | Temp 98.1°F | Ht 65.0 in | Wt 166.0 lb

## 2018-08-03 DIAGNOSIS — E1122 Type 2 diabetes mellitus with diabetic chronic kidney disease: Secondary | ICD-10-CM

## 2018-08-03 DIAGNOSIS — E890 Postprocedural hypothyroidism: Secondary | ICD-10-CM | POA: Diagnosis not present

## 2018-08-03 DIAGNOSIS — Z0000 Encounter for general adult medical examination without abnormal findings: Secondary | ICD-10-CM

## 2018-08-03 DIAGNOSIS — I129 Hypertensive chronic kidney disease with stage 1 through stage 4 chronic kidney disease, or unspecified chronic kidney disease: Secondary | ICD-10-CM | POA: Diagnosis not present

## 2018-08-03 DIAGNOSIS — Z23 Encounter for immunization: Secondary | ICD-10-CM

## 2018-08-03 DIAGNOSIS — Z6827 Body mass index (BMI) 27.0-27.9, adult: Secondary | ICD-10-CM | POA: Diagnosis not present

## 2018-08-03 DIAGNOSIS — N182 Chronic kidney disease, stage 2 (mild): Secondary | ICD-10-CM | POA: Diagnosis not present

## 2018-08-03 LAB — POCT URINALYSIS DIPSTICK
Blood, UA: NEGATIVE
Glucose, UA: NEGATIVE
Nitrite, UA: NEGATIVE
Protein, UA: POSITIVE — AB
Spec Grav, UA: >=1.03 — AB (ref 1.010–1.025)
Urobilinogen, UA: 0.2 E.U./dL
pH, UA: 5 (ref 5.0–8.0)

## 2018-08-03 LAB — POCT UA - MICROALBUMIN
Creatinine, POC: 300 mg/dL
Microalbumin Ur, POC: 80 mg/L

## 2018-08-03 MED ORDER — METFORMIN HCL ER 500 MG PO TB24: 500.0000 mg | ORAL_TABLET | Freq: Two times a day (BID) | ORAL | 2 refills | Status: DC

## 2018-08-03 MED ORDER — AMLODIPINE BESYLATE 5 MG PO TABS: 5.0000 mg | ORAL_TABLET | Freq: Every day | ORAL | 1 refills | Status: DC

## 2018-08-03 MED ORDER — PNEUMOCOCCAL 13-VAL CONJ VACC IM SUSP: 0.5000 mL | INTRAMUSCULAR | 0 refills | Status: AC

## 2018-08-03 NOTE — Patient Instructions (Signed)
Brooke Brown , Thank you for taking time to come for your Medicare Wellness Visit. I appreciate your ongoing commitment to your health goals. Please review the following plan we discussed and let me know if I can assist you in the future.   Screening recommendations/referrals: Colonoscopy: 09/2013 Mammogram: 01/2018 Bone Density: 01/2018 Recommended yearly ophthalmology/optometry visit for glaucoma screening and checkup Recommended yearly dental visit for hygiene and checkup  Vaccinations: Influenza vaccine: 02/2018 Pneumococcal vaccine: 04/2016 Tdap vaccine: 01/2013 Shingles vaccine: discussed    Advanced directives: Please bring a copy of your POA (Power of [place]) and/or Living Will to your next appointment.    Conditions/risks identified: Overweight  Next appointment: 12/06/2018 at 1000   Preventive Care 52 Years and Older, Female Preventive care refers to lifestyle choices and visits with your health care provider that can promote health and wellness. What does preventive care include?  A yearly physical exam. This is also called an annual well check.  Dental exams once or twice a year.  Routine eye exams. Ask your health care provider how often you should have your eyes checked.  Personal lifestyle choices, including:  Daily care of your teeth and gums.  Regular physical activity.  Eating a healthy diet.  Avoiding tobacco and drug use.  Limiting alcohol use.  Practicing safe sex.  Taking low-dose aspirin every day.  Taking vitamin and mineral supplements as recommended by your health care provider. What happens during an annual well check? The services and screenings done by your health care provider during your annual well check will depend on your age, overall health, lifestyle risk factors, and family history of disease. Counseling  Your health care provider may ask you questions about your:  Alcohol use.  Tobacco use.  Drug use.  Emotional  well-being.  Home and relationship well-being.  Sexual activity.  Eating habits.  History of falls.  Memory and ability to understand (cognition).  Work and work Statistician.  Reproductive health. Screening  You may have the following tests or measurements:  Height, weight, and BMI.  Blood pressure.  Lipid and cholesterol levels. These may be checked every 5 years, or more frequently if you are over 19 years old.  Skin check.  Lung cancer screening. You may have this screening every year starting at age 71 if you have a 30-pack-year history of smoking and currently smoke or have quit within the past 15 years.  Fecal occult blood test (FOBT) of the stool. You may have this test every year starting at age 75.  Flexible sigmoidoscopy or colonoscopy. You may have a sigmoidoscopy every 5 years or a colonoscopy every 10 years starting at age 70.  Hepatitis C blood test.  Hepatitis B blood test.  Sexually transmitted disease (STD) testing.  Diabetes screening. This is done by checking your blood sugar (glucose) after you have not eaten for a while (fasting). You may have this done every 1-3 years.  Bone density scan. This is done to screen for osteoporosis. You may have this done starting at age 56.  Mammogram. This may be done every 1-2 years. Talk to your health care provider about how often you should have regular mammograms. Talk with your health care provider about your test results, treatment options, and if necessary, the need for more tests. Vaccines  Your health care provider may recommend certain vaccines, such as:  Influenza vaccine. This is recommended every year.  Tetanus, diphtheria, and acellular pertussis (Tdap, Td) vaccine. You may need a Td booster  every 10 years.  Zoster vaccine. You may need this after age 80.  Pneumococcal 13-valent conjugate (PCV13) vaccine. One dose is recommended after age 88.  Pneumococcal polysaccharide (PPSV23) vaccine. One  dose is recommended after age 62. Talk to your health care provider about which screenings and vaccines you need and how often you need them. This information is not intended to replace advice given to you by your health care provider. Make sure you discuss any questions you have with your health care provider. Document Released: 06/07/2015 Document Revised: 01/29/2016 Document Reviewed: 03/12/2015 Elsevier Interactive Patient Education  2017 [place] Prevention in the Home Falls can cause injuries. They can happen to people of all ages. There are many things you can do to make your home safe and to help prevent falls. What can I do on the outside of my home?  Regularly fix the edges of walkways and driveways and fix any cracks.  Remove anything that might make you trip as you walk through a door, such as a raised step or threshold.  Trim any bushes or trees on the path to your home.  Use bright outdoor lighting.  Clear any walking paths of anything that might make someone trip, such as rocks or tools.  Regularly check to see if handrails are loose or broken. Make sure that both sides of any steps have handrails.  Any raised decks and porches should have guardrails on the edges.  Have any leaves, snow, or ice cleared regularly.  Use sand or salt on walking paths during winter.  Clean up any spills in your garage right away. This includes oil or grease spills. What can I do in the bathroom?  Use night lights.  Install grab bars by the toilet and in the tub and shower. Do not use towel bars as grab bars.  Use non-skid mats or decals in the tub or shower.  If you need to sit down in the shower, use a plastic, non-slip stool.  Keep the floor dry. Clean up any water that spills on the floor as soon as it happens.  Remove soap buildup in the tub or shower regularly.  Attach bath mats securely with double-sided non-slip rug tape.  Do not have throw rugs and other  things on the floor that can make you trip. What can I do in the bedroom?  Use night lights.  Make sure that you have a light by your bed that is easy to reach.  Do not use any sheets or blankets that are too big for your bed. They should not hang down onto the floor.  Have a firm chair that has side arms. You can use this for support while you get dressed.  Do not have throw rugs and other things on the floor that can make you trip. What can I do in the kitchen?  Clean up any spills right away.  Avoid walking on wet floors.  Keep items that you use a lot in easy-to-reach places.  If you need to reach something above you, use a strong step stool that has a grab bar.  Keep electrical cords out of the way.  Do not use floor polish or wax that makes floors slippery. If you must use wax, use non-skid floor wax.  Do not have throw rugs and other things on the floor that can make you trip. What can I do with my stairs?  Do not leave any items on the stairs.  Make  sure that there are handrails on both sides of the stairs and use them. Fix handrails that are broken or loose. Make sure that handrails are as long as the stairways.  Check any carpeting to make sure that it is firmly attached to the stairs. Fix any carpet that is loose or worn.  Avoid having throw rugs at the top or bottom of the stairs. If you do have throw rugs, attach them to the floor with carpet tape.  Make sure that you have a light switch at the top of the stairs and the bottom of the stairs. If you do not have them, ask someone to add them for you. What else can I do to help prevent falls?  Wear shoes that:  Do not have high heels.  Have rubber bottoms.  Are comfortable and fit you well.  Are closed at the toe. Do not wear sandals.  If you use a stepladder:  Make sure that it is fully opened. Do not climb a closed stepladder.  Make sure that both sides of the stepladder are locked into place.  Ask  someone to hold it for you, if possible.  Clearly mark and make sure that you can see:  Any grab bars or handrails.  First and last steps.  Where the edge of each step is.  Use tools that help you move around (mobility aids) if they are needed. These include:  Canes.  Walkers.  Scooters.  Crutches.  Turn on the lights when you go into a dark area. Replace any light bulbs as soon as they burn out.  Set up your furniture so you have a clear path. Avoid moving your furniture around.  If any of your floors are uneven, fix them.  If there are any pets around you, be aware of where they are.  Review your medicines with your doctor. Some medicines can make you feel dizzy. This can increase your chance of falling. Ask your doctor what other things that you can do to help prevent falls. This information is not intended to replace advice given to you by your health care provider. Make sure you discuss any questions you have with your health care provider. Document Released: 03/07/2009 Document Revised: 10/17/2015 Document Reviewed: 06/15/2014 Elsevier Interactive Patient Education  2017 [employer].

## 2018-08-03 NOTE — Progress Notes (Signed)
Subjective:   Brooke Brown is a 68 y.o. female who presents for Medicare Annual (Subsequent) preventive examination.  Review of Systems:  n/a Cardiac Risk Factors include: advanced age (>63men, >23 women);diabetes mellitus;hypertension     Objective:     Vitals: BP 114/66 (Patient Position: Sitting)   Pulse 91   Temp 98.1 F (36.7 C) (Oral)   Ht 5\' 5"  (1.651 m)   Wt 166 lb (75.3 kg)   BMI 27.62 kg/m   Body mass index is 27.62 kg/m.  Advanced Directives 08/03/2018 08/09/2017  Does Patient Have a Medical Advance Directive? Yes Yes  Type of Advance Directive Living will Living will  Does patient want to make changes to medical advance directive? - No - Patient declined    Tobacco Social History   Tobacco Use  Smoking Status Never Smoker  Smokeless Tobacco Never Used     Counseling given: Not Answered   Clinical Intake:  Pre-visit preparation completed: Yes  Pain : No/denies pain Pain Score: 0-No pain     Nutritional Status: BMI 25 -29 Overweight Nutritional Risks: None Diabetes: Yes CBG done?: No Did pt. bring in CBG monitor from home?: No  How often do you need to have someone help you when you read instructions, pamphlets, or other written materials from your doctor or pharmacy?: 1 - Never What is the last grade level you completed in school?: some college  Interpreter Needed?: No  Information entered by :: NAllen LPN  Past Medical History:  Diagnosis Date  . Anxiety   . Arthritis   . Diabetes mellitus   . Grave's disease   . Grave's disease   . Hyperlipemia   . Hypertension    Past Surgical History:  Procedure Laterality Date  . ABDOMINAL HYSTERECTOMY    . CATARACT EXTRACTION W/ INTRAOCULAR LENS  IMPLANT, BILATERAL Bilateral   . CHOLECYSTECTOMY N/A 08/16/2017   Procedure: LAPAROSCOPIC CHOLECYSTECTOMY;  Surgeon: Judeth Horn, MD;  Location: [facility];  Service: General;  Laterality: N/A;  . TUBAL LIGATION     Family History  Problem  Relation Age of Onset  . Pancreatic cancer Mother   . Heart failure Father   . Diabetes Brother   . Breast cancer Cousin    Social History   Socioeconomic History  . Marital status: Married    Spouse name: Not on file  . Number of children: Not on file  . Years of education: Not on file  . Highest education level: Not on file  Occupational History  . Occupation: retired  Scientific laboratory technician  . Financial resource strain: Not hard at all  . Food insecurity:    Worry: Never true    Inability: Never true  . Transportation needs:    Medical: No    Non-medical: No  Tobacco Use  . Smoking status: Never Smoker  . Smokeless tobacco: Never Used  Substance and Sexual Activity  . Alcohol use: Not Currently    Alcohol/week: 0.0 standard drinks    Comment: rarely  . Drug use: No  . Sexual activity: Yes  Lifestyle  . Physical activity:    Days per week: 4 days    Minutes per session: 90 min  . Stress: Not at all  Relationships  . Social connections:    Talks on phone: Not on file    Gets together: Not on file    Attends religious service: Not on file    Active member of club or organization: Not on file  Attends meetings of clubs or organizations: Not on file    Relationship status: Not on file  Other Topics Concern  . Not on file  Social History Narrative  . Not on file    Outpatient Encounter Medications as of 08/03/2018  Medication Sig  . acetaminophen (TYLENOL) 500 MG tablet Take 500 mg by mouth daily as needed for moderate pain or headache.  Marland Kitchen aspirin EC 81 MG tablet Take 81 mg by mouth daily.  . calcium carbonate (OSCAL) 1500 (600 Ca) MG TABS tablet Take 600 mg of elemental calcium by mouth daily.  . Carboxymethylcellul-Glycerin (LUBRICATING EYE DROPS OP) Place 1 drop into both eyes 2 (two) times daily.  . Cholecalciferol (VITAMIN D3) 5000 units CAPS Take 5,000 Units by mouth daily.  . hydrochlorothiazide (HYDRODIURIL) 25 MG tablet Take 1 tablet (25 mg total) by mouth  daily.  . Multiple Vitamin (MULTIVITAMIN WITH MINERALS) TABS tablet Take 1 tablet by mouth daily.  Marland Kitchen olmesartan (BENICAR) 40 MG tablet Take 1 tablet (40 mg total) by mouth daily.  Glory Rosebush DELICA LANCETS 54O MISC USE AS DIRECTED TO CHECK BLOOD SUGARS 1 TIME PER DAY  . Pitavastatin Calcium (LIVALO) 2 MG TABS Take 1 tablet (2 mg total) by mouth daily.  . pneumococcal 13-valent conjugate vaccine (PREVNAR 13) SUSP injection Inject 0.5 mLs into the muscle tomorrow at 10 am for 1 dose.  Marland Kitchen SYNTHROID 112 MCG tablet TAKE 1 TABLET BY MOUTH ONCE DAILY  . [DISCONTINUED] amLODipine (NORVASC) 5 MG tablet Take 1 tablet (5 mg total) by mouth daily.  . [DISCONTINUED] metFORMIN (GLUCOPHAGE-XR) 500 MG 24 hr tablet Take 1 tablet (500 mg total) by mouth 2 (two) times daily.  . [DISCONTINUED] Pitavastatin Calcium (LIVALO) 2 MG TABS Take 2 mg by mouth daily.   No facility-administered encounter medications on file as of 08/03/2018.     Activities of Daily Living In your present state of health, do you have any difficulty performing the following activities: 08/03/2018 08/09/2017  Hearing? N N  Vision? N N  Difficulty concentrating or making decisions? N N  Walking or climbing stairs? N N  Dressing or bathing? N N  Doing errands, shopping? N N  Preparing Food and eating ? N -  Using the Toilet? N -  In the past six months, have you accidently leaked urine? N -  Do you have problems with loss of bowel control? N -  Managing your Medications? N -  Managing your Finances? N -  Housekeeping or managing your Housekeeping? N -  Some recent data might be hidden    Patient Care Team: Glendale Chard, MD as PCP - General (Internal Medicine)    Assessment:   This is a routine wellness examination for [place].  Exercise Activities and Dietary recommendations Current Exercise Habits: Structured exercise class, Type of exercise: walking;Other - see comments(biking), Time (Minutes): > 60, Frequency (Times/Week): 4,  Weekly Exercise (Minutes/Week): 0, Intensity: Moderate  Goals    . Patient Stated (pt-stated)     Wants to be able to get off diabetes medicine       Fall Risk Fall Risk  08/03/2018 08/03/2018 03/10/2018 12/02/2015 09/21/2015  Falls in the past year? 0 0 No Yes No  Number falls in past yr: - - - 1 -  Injury with Fall? - - - No -  Risk for fall due to : Medication side effect - - - -   Is the patient's home free of loose throw rugs in walkways, pet  beds, electrical cords, etc?   yes      Grab bars in the bathroom? yes      Handrails on the stairs?   yes      Adequate lighting?   yes  Timed Get Up and Go performed: n/a  Depression Screen PHQ 2/9 Scores 08/03/2018 08/03/2018 07/06/2018 03/10/2018  PHQ - 2 Score 0 0 0 0     Cognitive Function     6CIT Screen 08/03/2018  What Year? 0 points  What month? 0 points  What time? 0 points  Count back from 20 0 points  Months in reverse 0 points  Repeat phrase 0 points  Total Score 0    Immunization History  Administered Date(s) Administered  . Influenza, High Dose Seasonal PF 03/10/2018    Qualifies for Shingles Vaccine? yes  Screening Tests Health Maintenance  Topic Date Due  . FOOT EXAM  01/01/1961  . PNA vac Low Risk Adult (2 of 2 - PPSV23) 05/06/2017  . HEMOGLOBIN A1C  09/09/2018  . OPHTHALMOLOGY EXAM  04/15/2019  . MAMMOGRAM  02/10/2020  . TETANUS/TDAP  01/20/2022  . COLONOSCOPY  10/05/2023  . INFLUENZA VACCINE  Completed  . DEXA SCAN  Completed  . Hepatitis C Screening  Completed    Cancer Screenings: Lung: Low Dose CT Chest recommended if Age 17-80 years, 30 pack-year currently smoking OR have quit w/in 15years. Patient does not qualify. Breast:  Up to date on Mammogram? Yes   Up to date of Bone Density/Dexa? Yes Colorectal: up to date  Additional Screenings: : Hepatitis C Screening: 01/20/2012     Plan:   Wants to get off diabetic medications  I have personally reviewed and noted the following in the  patient's chart:   . Medical and social history . Use of alcohol, tobacco or illicit drugs  . Current medications and supplements . Functional ability and status . Nutritional status . Physical activity . Advanced directives . List of other physicians . Hospitalizations, surgeries, and ER visits in previous 12 months . Vitals . Screenings to include cognitive, depression, and falls . Referrals and appointments  In addition, I have reviewed and discussed with patient certain preventive protocols, quality metrics, and best practice recommendations. A written personalized care plan for preventive services as well as general preventive health recommendations were provided to patient.     Kellie Simmering, LPN  10/10/8414

## 2018-08-03 NOTE — Patient Instructions (Signed)
Hypothyroidism  Hypothyroidism is when the thyroid gland does not make enough of certain hormones (it is underactive). The thyroid gland is a small gland located in the lower front part of the neck, just in front of the windpipe (trachea). This gland makes hormones that help control how the body uses food for energy (metabolism) as well as how the heart and brain function. These hormones also play a role in keeping your bones strong. When the thyroid is underactive, it produces too little of the hormones thyroxine (T4) and triiodothyronine (T3). What are the causes? This condition may be caused by:  Hashimoto's disease. This is a disease in which the body's disease-fighting system (immune system) attacks the thyroid gland. This is the most common cause.  Viral infections.  Pregnancy.  Certain medicines.  Birth defects.  Past radiation treatments to the head or neck for cancer.  Past treatment with radioactive iodine.  Past exposure to radiation in the environment.  Past surgical removal of part or all of the thyroid.  Problems with a gland in the center of the brain (pituitary gland).  Lack of enough iodine in the diet. What increases the risk? You are more likely to develop this condition if:  You are female.  You have a family history of thyroid conditions.  You use a medicine called lithium.  You take medicines that affect the immune system (immunosuppressants). What are the signs or symptoms? Symptoms of this condition include:  Feeling as though you have no energy (lethargy).  Not being able to tolerate cold.  Weight gain that is not explained by a change in diet or exercise habits.  Lack of appetite.  Dry skin.  Coarse hair.  Menstrual irregularity.  Slowing of thought processes.  Constipation.  Sadness or depression. How is this diagnosed? This condition may be diagnosed based on:  Your symptoms, your medical history, and a physical exam.  Blood  tests. You may also have imaging tests, such as an ultrasound or MRI. How is this treated? This condition is treated with medicine that replaces the thyroid hormones that your body does not make. After you begin treatment, it may take several weeks for symptoms to go away. Follow these instructions at home:  Take over-the-counter and prescription medicines only as told by your health care provider.  If you start taking any new medicines, tell your health care provider.  Keep all follow-up visits as told by your health care provider. This is important. ? As your condition improves, your dosage of thyroid hormone medicine may change. ? You will need to have blood tests regularly so that your health care provider can monitor your condition. Contact a health care provider if:  Your symptoms do not get better with treatment.  You are taking thyroid replacement medicine and you: ? Sweat a lot. ? Have tremors. ? Feel anxious. ? Lose weight rapidly. ? Cannot tolerate heat. ? Have emotional swings. ? Have diarrhea. ? Feel weak. Get help right away if you have:  Chest pain.  An irregular heartbeat.  A rapid heartbeat.  Difficulty breathing. Summary  Hypothyroidism is when the thyroid gland does not make enough of certain hormones (it is underactive).  When the thyroid is underactive, it produces too little of the hormones thyroxine (T4) and triiodothyronine (T3).  The most common cause is Hashimoto's disease, a disease in which the body's disease-fighting system (immune system) attacks the thyroid gland. The condition can also be caused by viral infections, medicine, pregnancy, or past   radiation treatment to the head or neck.  Symptoms may include weight gain, dry skin, constipation, feeling as though you do not have energy, and not being able to tolerate cold.  This condition is treated with medicine to replace the thyroid hormones that your body does not make. This information  is not intended to replace advice given to you by your health care provider. Make sure you discuss any questions you have with your health care provider. Document Released: 05/11/2005 Document Revised: 04/21/2017 Document Reviewed: 04/21/2017 Elsevier Interactive Patient Education  2019 [employer]  

## 2018-08-04 LAB — T4, FREE: Free T4: 1.6 ng/dL (ref 0.82–1.77)

## 2018-08-04 LAB — TSH: TSH: 1.02 u[IU]/mL (ref 0.450–4.500)

## 2018-08-06 NOTE — Progress Notes (Signed)
Subjective:     Patient ID: Brooke Brown , female    DOB: 1950-12-09 , 68 y.o.   MRN: [id number]   Chief Complaint  Patient presents with  . Diabetes  . Hypertension    HPI  Diabetes  She presents for her follow-up diabetic visit. She has type 2 diabetes mellitus. Her disease course has been stable. There are no hypoglycemic associated symptoms. Pertinent negatives for diabetes include no blurred vision and no chest pain. There are no hypoglycemic complications. Diabetic complications include nephropathy. Risk factors for coronary artery disease include diabetes mellitus, dyslipidemia, hypertension, post-menopausal and sedentary lifestyle. Her weight is stable. She is following a diabetic diet. She participates in exercise three times a week. Her breakfast blood glucose is taken between 8-9 am. Her breakfast blood glucose range is generally 110-130 mg/dl. Eye exam is current.  Hypertension  This is a chronic problem. The current episode started more than 1 year ago. The problem has been gradually improving since onset. The problem is controlled. Pertinent negatives include no blurred vision, chest pain, palpitations or shortness of breath.   She reports compliance with meds.  Past Medical History:  Diagnosis Date  . Anxiety   . Arthritis   . Diabetes mellitus   . Grave's disease   . Grave's disease   . Hyperlipemia   . Hypertension      Family History  Problem Relation Age of Onset  . Pancreatic cancer Mother   . Heart failure Father   . Diabetes Brother   . Breast cancer Cousin      Current Outpatient Medications:  .  acetaminophen (TYLENOL) 500 MG tablet, Take 500 mg by mouth daily as needed for moderate pain or headache., Disp: , Rfl:  .  amLODipine (NORVASC) 5 MG tablet, Take 1 tablet (5 mg total) by mouth daily., Disp: 90 tablet, Rfl: 1 .  aspirin EC 81 MG tablet, Take 81 mg by mouth daily., Disp: , Rfl:  .  calcium carbonate (OSCAL) 1500 (600 Ca) MG TABS tablet,  Take 600 mg of elemental calcium by mouth daily., Disp: , Rfl:  .  Carboxymethylcellul-Glycerin (LUBRICATING EYE DROPS OP), Place 1 drop into both eyes 2 (two) times daily., Disp: , Rfl:  .  Cholecalciferol (VITAMIN D3) 5000 units CAPS, Take 5,000 Units by mouth daily., Disp: , Rfl:  .  hydrochlorothiazide (HYDRODIURIL) 25 MG tablet, Take 1 tablet (25 mg total) by mouth daily., Disp: 90 tablet, Rfl: 1 .  metFORMIN (GLUCOPHAGE-XR) 500 MG 24 hr tablet, Take 1 tablet (500 mg total) by mouth 2 (two) times daily., Disp: 180 tablet, Rfl: 2 .  Multiple Vitamin (MULTIVITAMIN WITH MINERALS) TABS tablet, Take 1 tablet by mouth daily., Disp: , Rfl:  .  olmesartan (BENICAR) 40 MG tablet, Take 1 tablet (40 mg total) by mouth daily., Disp: 90 tablet, Rfl: 1 .  ONETOUCH DELICA LANCETS 64Q MISC, USE AS DIRECTED TO CHECK BLOOD SUGARS 1 TIME PER DAY, Disp: 100 each, Rfl: 11 .  Pitavastatin Calcium (LIVALO) 2 MG TABS, Take 1 tablet (2 mg total) by mouth daily., Disp: 90 tablet, Rfl: 1 .  SYNTHROID 112 MCG tablet, TAKE 1 TABLET BY MOUTH ONCE DAILY, Disp: 30 tablet, Rfl: 3   No Known Allergies   Review of Systems  Constitutional: Negative.   Eyes: Negative for blurred vision.  Respiratory: Negative.  Negative for shortness of breath.   Cardiovascular: Negative.  Negative for chest pain and palpitations.  Gastrointestinal: Negative.   Neurological: Negative.  Psychiatric/Behavioral: Negative.      Today's Vitals   08/03/18 1448  BP: 114/66  Pulse: 91  Temp: 98.1 F (36.7 C)  TempSrc: Oral  Weight: 166 lb (75.3 kg)  Height: 5\' 5"  (1.651 m)  PainSc: 0-No pain   Body mass index is 27.62 kg/m.   Objective:  Physical Exam Vitals signs and nursing note reviewed.  Constitutional:      Appearance: Normal appearance.  HENT:     Head: Normocephalic and atraumatic.  Cardiovascular:     Rate and Rhythm: Normal rate and regular rhythm.     Heart sounds: Normal heart sounds.  Pulmonary:     Effort:  Pulmonary effort is normal.     Breath sounds: Normal breath sounds.  Skin:    General: Skin is warm.  Neurological:     General: No focal deficit present.     Mental Status: She is alert.  Psychiatric:        Mood and Affect: Mood normal.        Behavior: Behavior normal.         Assessment And Plan:     1. Type 2 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 disease, without long-term current use of insulin (HCC)  I will check BMP, A1c today. She is encouraged to avoid sugary beverages and to exercise no less than five days weekly.   2. Parenchymal renal hypertension, stage 1 through stage 4 or unspecified chronic kidney disease  Well controlled. She will continue with current meds. She is encouraged to avoid adding salt to her foods.   3. Postablative hypothyroidism  I will check thyroid panel and adjust meds as needed.  - TSH - T4, Free  4. Body mass index (bmi) 27.0-27.9, adult  Her weight is stable. She is encouraged to exercise no less than five days weekly for at least 30 minutes.   [name], MD

## 2018-08-18 ENCOUNTER — Other Ambulatory Visit: Payer: Self-pay | Admitting: Internal Medicine

## 2018-08-23 ENCOUNTER — Other Ambulatory Visit (INDEPENDENT_AMBULATORY_CARE_PROVIDER_SITE_OTHER): Payer: PPO

## 2018-08-23 ENCOUNTER — Ambulatory Visit: Payer: PPO

## 2018-08-23 ENCOUNTER — Other Ambulatory Visit: Payer: Self-pay

## 2018-08-23 DIAGNOSIS — R350 Frequency of micturition: Secondary | ICD-10-CM

## 2018-08-23 LAB — POCT URINALYSIS DIPSTICK
Bilirubin, UA: NEGATIVE
Glucose, UA: NEGATIVE
Ketones, UA: NEGATIVE
Nitrite, UA: NEGATIVE
Protein, UA: NEGATIVE
Spec Grav, UA: <=1.005 — AB (ref 1.010–1.025)
Urobilinogen, UA: 0.2 E.U./dL
pH, UA: 5.5 (ref 5.0–8.0)

## 2018-08-24 LAB — URINE CULTURE: Organism ID, Bacteria: NO GROWTH

## 2018-08-25 ENCOUNTER — Other Ambulatory Visit: Payer: Self-pay

## 2018-08-25 ENCOUNTER — Encounter: Payer: Self-pay | Admitting: Internal Medicine

## 2018-08-25 ENCOUNTER — Ambulatory Visit (INDEPENDENT_AMBULATORY_CARE_PROVIDER_SITE_OTHER): Payer: PPO | Admitting: Internal Medicine

## 2018-08-25 VITALS — BP 174/99 | HR 77 | Temp 97.9°F | Ht 65.0 in

## 2018-08-25 DIAGNOSIS — M79641 Pain in right hand: Secondary | ICD-10-CM

## 2018-08-25 DIAGNOSIS — N182 Chronic kidney disease, stage 2 (mild): Secondary | ICD-10-CM | POA: Diagnosis not present

## 2018-08-25 DIAGNOSIS — I129 Hypertensive chronic kidney disease with stage 1 through stage 4 chronic kidney disease, or unspecified chronic kidney disease: Secondary | ICD-10-CM | POA: Diagnosis not present

## 2018-08-25 MED ORDER — TRAMADOL HCL 50 MG PO TABS: 50.0000 mg | ORAL_TABLET | Freq: Four times a day (QID) | ORAL | 0 refills | Status: AC | PRN

## 2018-08-25 NOTE — Progress Notes (Signed)
Virtual Visit via Video Note    Evaluation Performed:  Follow-up visit  This visit type was conducted due to national recommendations for restrictions regarding the COVID-19 Pandemic (e.g. social distancing).  This format is felt to be most appropriate for this patient at this time.  All issues noted in this document were discussed and addressed.  No physical exam was performed (except for noted visual exam findings with Video Visits).  Please refer to the patient's chart (MyChart message for video visits and phone note for telephone visits) for the patient's consent to telehealth for [facility].  Date:  08/25/2018   ID:  Brooke Brown, DOB 01/12/1951, MRN [id number]  Patient Location:  Home   Provider location:   office    Chief Complaint:  r hand pain  History of Present Illness:    Brooke Brown is a 68 y.o. female who presents via video conferencing for a telehealth visit today.   The patient does not symptoms concerning for COVID-19 infection (fever, chills, cough, or new shortness of breath).   Hand Pain   The incident occurred more than 1 week ago. The incident occurred at home. There was no injury mechanism. The pain is present in the right wrist and right hand. The quality of the pain is described as aching. The pain does not radiate. The pain is at a severity of 8/10. The pain is severe. The pain has been intermittent since the incident. Pertinent negatives include no chest pain, muscle weakness, numbness or tingling. The symptoms are aggravated by movement. She has tried acetaminophen and NSAIDs for the symptoms. The treatment provided mild relief.  She denies fall/trauma. She has had some relief while wearing brace.    Past Medical History:  Diagnosis Date  . Anxiety   . Arthritis   . Diabetes mellitus   . Grave's disease   . Grave's disease   . Hyperlipemia   . Hypertension    Past Surgical History:  Procedure Laterality Date  . ABDOMINAL HYSTERECTOMY    .  CATARACT EXTRACTION W/ INTRAOCULAR LENS  IMPLANT, BILATERAL Bilateral   . CHOLECYSTECTOMY N/A 08/16/2017   Procedure: LAPAROSCOPIC CHOLECYSTECTOMY;  Surgeon: Judeth Horn, MD;  Location: [place];  Service: General;  Laterality: N/A;  . TUBAL LIGATION       Current Meds  Medication Sig  . acetaminophen (TYLENOL) 500 MG tablet Take 500 mg by mouth daily as needed for moderate pain or headache.  Marland Kitchen amLODipine (NORVASC) 5 MG tablet Take 1 tablet (5 mg total) by mouth daily.  Marland Kitchen aspirin EC 81 MG tablet Take 81 mg by mouth daily.  . calcium carbonate (OSCAL) 1500 (600 Ca) MG TABS tablet Take 600 mg of elemental calcium by mouth daily.  . Carboxymethylcellul-Glycerin (LUBRICATING EYE DROPS OP) Place 1 drop into both eyes 2 (two) times daily.  . Cholecalciferol (VITAMIN D3) 5000 units CAPS Take 5,000 Units by mouth daily.  . hydrochlorothiazide (HYDRODIURIL) 25 MG tablet Take 1 tablet (25 mg total) by mouth daily.  . metFORMIN (GLUCOPHAGE-XR) 500 MG 24 hr tablet Take 1 tablet (500 mg total) by mouth 2 (two) times daily.  . Multiple Vitamin (MULTIVITAMIN WITH MINERALS) TABS tablet Take 1 tablet by mouth daily.  Marland Kitchen olmesartan (BENICAR) 40 MG tablet Take 1 tablet (40 mg total) by mouth daily.  Glory Rosebush DELICA LANCETS 06C MISC USE AS DIRECTED TO CHECK BLOOD SUGARS 1 TIME PER DAY  . Pitavastatin Calcium (LIVALO) 2 MG TABS Take 1 tablet (2 mg  total) by mouth daily.  Marland Kitchen SYNTHROID 112 MCG tablet Take 1 tablet by mouth once daily     Allergies:   Patient has no known allergies.   Social History   Tobacco Use  . Smoking status: Never Smoker  . Smokeless tobacco: Never Used  Substance Use Topics  . Alcohol use: Not Currently    Alcohol/week: 0.0 standard drinks    Comment: rarely  . Drug use: No     Family Hx: The patient's family history includes Breast cancer in her cousin; Diabetes in her brother; Heart failure in her father; Pancreatic cancer in her mother.  ROS:   Please see the history of  present illness.    Review of Systems  Constitutional: Negative.   Respiratory: Negative.   Cardiovascular: Negative.  Negative for chest pain.  Gastrointestinal: Negative.   Musculoskeletal: Positive for joint pain.  Neurological: Negative.  Negative for tingling and numbness.  Psychiatric/Behavioral: Negative.     All other systems reviewed and are negative.   Labs/Other Tests and Data Reviewed:    Recent Labs: 03/10/2018: ALT 14 07/02/2018: BUN 11; Creatinine, Ser 0.80; Potassium 3.9; Sodium 139 08/03/2018: TSH 1.020   Recent Lipid Panel Lab Results  Component Value Date/Time   CHOL 171 03/10/2018 12:14 PM   TRIG 235 (H) 03/10/2018 12:14 PM   HDL 49 03/10/2018 12:14 PM   CHOLHDL 3.5 03/10/2018 12:14 PM   LDLCALC 75 03/10/2018 12:14 PM    Wt Readings from Last 3 Encounters:  08/03/18 166 lb (75.3 kg)  08/03/18 166 lb (75.3 kg)  07/06/18 167 lb 12.8 oz (76.1 kg)     Exam:    Vital Signs:  BP (!) 174/99 (BP Location: Left Arm, Patient Position: Sitting, Cuff Size: Normal) Comment: pt provided Comment (BP Location): pt provided Comment (Patient Position): pt provided Comment (Cuff Size): pt provided  Pulse 77 Comment: pt provided  Temp 97.9 F (36.6 C) (Oral) Comment: pt provided Comment (Src): pt provided  Ht 5\' 5"  (1.651 m)   BMI 27.62 kg/m     Physical Exam  Constitutional: She is oriented to person, place, and time and well-developed, well-nourished, and in no distress.  HENT:  Head: Normocephalic and atraumatic.  Pulmonary/Chest: Effort normal.  Musculoskeletal:     Comments: Does not appear to have R hand swelling. NO overlying erythema.   Neurological: She is alert and oriented to person, place, and time.  Psychiatric: Affect normal.  Nursing note and vitals reviewed.   ASSESSMENT & PLAN:    1.  1. Right hand pain  She is advised to continue wearing the splint. She was also given rx tramadol to use prn. She is encouraged to sign up for Mychart so she  can email me an update tomorrow.  She also agrees to come to office next week for labwork. I will check an arthritis panel.   2. Parenchymal renal hypertension, stage 1 through stage 4 or unspecified chronic kidney disease  Uncontrolled. Repeat bp taken by patient is 158/100. This is likely exacerbated by pain. She will continue with current meds for now. She is encouraged to limit her salt intake.   3. Chronic renal disease, stage II  Chronic. She is encouraged to stay well hydrated.     COVID-19 Education: The signs and symptoms of COVID-19 were discussed with the patient and how to seek care for testing (follow up with PCP or arrange E-visit).  The importance of social distancing was discussed today.  Patient Risk:  After full review of this patients clinical status, I feel that they are at moderate risk due to age/dm/htn.  Time:   Today, I have spent 14 minutes 28 seconds with the patient with telehealth technology discussing above diagnoses.     Medication Adjustments/Labs and Tests Ordered: Current medicines are reviewed at length with the patient today.  Concerns regarding medicines are outlined above.  Tests Ordered: Orders Placed This Encounter  Procedures  . ANA, IFA (with reflex)  . CYCLIC CITRUL PEPTIDE ANTIBODY, IGG/IGA  . Rheumatoid factor  . Sedimentation rate  . Uric acid   Medication Changes: Meds ordered this encounter  Medications  . traMADol (ULTRAM) 50 MG tablet    Sig: Take 1 tablet (50 mg total) by mouth every 6 (six) hours as needed.    Dispense:  30 tablet    Refill:  0    Disposition:  Follow up prn  Signed, [name], MD

## 2018-08-25 NOTE — Patient Instructions (Signed)
Hand Pain  Many things can cause hand pain. Some common causes are:  · An injury.  · Repeating the same movement with your hand over and over (overuse).  · Osteoporosis.  · Arthritis.  · Lumps in the tendons or joints of the hand and wrist (ganglion cysts).  · Infection.  Follow these instructions at home:  Pay attention to any changes in your symptoms. Take these actions to help with your discomfort:  · If directed, put ice on the affected area:  ? Put ice in a plastic bag.  ? Place a towel between your skin and the bag.  ? Leave the ice on for 15-20 minutes, 3?4 times a day for 2 days.  · Take over-the-counter and prescription medicines only as told by your health care provider.  · Minimize stress on your hands and wrists as much as possible.  · Take breaks from repetitive activity often.  · Do stretches as told by your health care provider.  · Do not do activities that make your pain worse.  Contact a health care provider if:  · Your pain does not get better after a few days of self-care.  · Your pain gets worse.  · Your pain affects your ability to do your daily activities.  Get help right away if:  · Your hand becomes warm, red, or swollen.  · Your hand is numb or tingling.  · Your hand is extremely swollen or deformed.  · Your hand or fingers turn white or blue.  · You cannot move your hand, wrist, or fingers.  This information is not intended to replace advice given to you by your health care provider. Make sure you discuss any questions you have with your health care provider.  Document Released: 06/07/2015 Document Revised: 10/17/2015 Document Reviewed: 06/06/2014  Elsevier Interactive Patient Education © 2019 [employer]

## 2018-08-26 ENCOUNTER — Other Ambulatory Visit: Payer: Self-pay

## 2018-08-26 NOTE — Patient Outreach (Signed)
[facility]) Care Management  08/26/2018  Brooke Brown 10/14/1950 [id number]  TELEPHONE SCREENING Referral date:08/25/18 Referral source: nurse advise line Referral reason: hand pain Insurance: health team advantage  Telephone call to patient regarding nurse advise line referral. HIPAA verified. Explained reason for call. Patient states she has had right hand pain over the last couple of days. Patient denies having any form of arthritis and denies injury to hand.  Patient states she spoke with her primary MD on yesterday and a prescription was called in for medication. Patient states she picked up the medication and started taking it.  Patient states she has only felt minimal relief.  Describes pain as being an 8 on a scale of 1-10.   Patient states she has spoken with the doctors office today to inform them she is not getting relief from the medication prescribed.  Patient states the doctor has scheduled blood work for her on 08/29/18. Patient state she is waiting to here back from the doctor at this point.  Patient denies any further needs at this time.  RNCM advised patient to contact nurse advise line if needed.  RNCM advised patient to notify MD of any changes in condition prior to scheduled appointment. RNCM verified patient aware of 911 services for urgent/ emergent needs.  PLAN:  RNCM will close patient due to patient being assessed and having no further needs.   Quinn Plowman RN,BSN,CCM [facility] Telephonic  [phone]

## 2018-08-29 ENCOUNTER — Other Ambulatory Visit: Payer: PPO

## 2018-08-29 ENCOUNTER — Other Ambulatory Visit: Payer: Self-pay

## 2018-08-29 DIAGNOSIS — F17200 Nicotine dependence, unspecified, uncomplicated: Secondary | ICD-10-CM | POA: Diagnosis not present

## 2018-08-29 DIAGNOSIS — J449 Chronic obstructive pulmonary disease, unspecified: Secondary | ICD-10-CM | POA: Diagnosis not present

## 2018-08-29 DIAGNOSIS — G4710 Hypersomnia, unspecified: Secondary | ICD-10-CM | POA: Diagnosis not present

## 2018-08-29 DIAGNOSIS — M48061 Spinal stenosis, lumbar region without neurogenic claudication: Secondary | ICD-10-CM | POA: Diagnosis not present

## 2018-08-29 DIAGNOSIS — M2550 Pain in unspecified joint: Secondary | ICD-10-CM | POA: Diagnosis not present

## 2018-08-29 DIAGNOSIS — M4856XD Collapsed vertebra, not elsewhere classified, lumbar region, subsequent encounter for fracture with routine healing: Secondary | ICD-10-CM | POA: Diagnosis not present

## 2018-08-29 DIAGNOSIS — I739 Peripheral vascular disease, unspecified: Secondary | ICD-10-CM | POA: Diagnosis not present

## 2018-08-29 DIAGNOSIS — R05 Cough: Secondary | ICD-10-CM | POA: Diagnosis not present

## 2018-08-29 DIAGNOSIS — I10 Essential (primary) hypertension: Secondary | ICD-10-CM | POA: Diagnosis not present

## 2018-08-29 DIAGNOSIS — R0602 Shortness of breath: Secondary | ICD-10-CM | POA: Diagnosis not present

## 2018-08-29 DIAGNOSIS — M79641 Pain in right hand: Secondary | ICD-10-CM | POA: Diagnosis not present

## 2018-08-29 DIAGNOSIS — R609 Edema, unspecified: Secondary | ICD-10-CM | POA: Diagnosis not present

## 2018-08-30 LAB — CYCLIC CITRUL PEPTIDE ANTIBODY, IGG/IGA: Cyclic Citrullin Peptide Ab: 8 units (ref 0–19)

## 2018-08-30 LAB — SEDIMENTATION RATE: Sed Rate: 19 mm/hr (ref 0–40)

## 2018-08-30 LAB — RHEUMATOID FACTOR: Rheumatoid fact SerPl-aCnc: 13.3 IU/mL (ref 0.0–13.9)

## 2018-08-30 LAB — URIC ACID: Uric Acid: 5 mg/dL (ref 2.5–7.1)

## 2018-08-30 LAB — ANTINUCLEAR ANTIBODIES, IFA: ANA Titer 1: NEGATIVE

## 2018-09-02 ENCOUNTER — Encounter: Payer: Self-pay | Admitting: Internal Medicine

## 2018-09-11 ENCOUNTER — Encounter: Payer: Self-pay | Admitting: Internal Medicine

## 2018-10-01 ENCOUNTER — Other Ambulatory Visit: Payer: Self-pay | Admitting: Internal Medicine

## 2018-10-03 MED ORDER — SYNTHROID 112 MCG PO TABS: 112.0000 ug | ORAL_TABLET | Freq: Every day | ORAL | 1 refills | Status: DC

## 2018-10-13 DIAGNOSIS — K5900 Constipation, unspecified: Secondary | ICD-10-CM | POA: Diagnosis not present

## 2018-10-13 DIAGNOSIS — Z86010 Personal history of colonic polyps: Secondary | ICD-10-CM | POA: Diagnosis not present

## 2018-10-13 DIAGNOSIS — Z1211 Encounter for screening for malignant neoplasm of colon: Secondary | ICD-10-CM | POA: Diagnosis not present

## 2018-11-09 DIAGNOSIS — K5730 Diverticulosis of large intestine without perforation or abscess without bleeding: Secondary | ICD-10-CM | POA: Diagnosis not present

## 2018-11-09 DIAGNOSIS — Z1211 Encounter for screening for malignant neoplasm of colon: Secondary | ICD-10-CM | POA: Diagnosis not present

## 2018-11-09 DIAGNOSIS — Z86010 Personal history of colonic polyps: Secondary | ICD-10-CM | POA: Diagnosis not present

## 2018-11-09 LAB — HM COLONOSCOPY

## 2018-11-10 ENCOUNTER — Encounter: Payer: Self-pay | Admitting: Internal Medicine

## 2018-11-14 ENCOUNTER — Other Ambulatory Visit: Payer: Self-pay

## 2018-11-14 MED ORDER — METFORMIN HCL ER 500 MG PO TB24: 500.0000 mg | ORAL_TABLET | Freq: Two times a day (BID) | ORAL | 2 refills | Status: DC

## 2018-11-22 ENCOUNTER — Other Ambulatory Visit: Payer: Self-pay | Admitting: Internal Medicine

## 2018-11-28 ENCOUNTER — Encounter: Payer: Self-pay | Admitting: Internal Medicine

## 2018-11-28 ENCOUNTER — Other Ambulatory Visit: Payer: Self-pay

## 2018-11-28 MED ORDER — ONETOUCH ULTRA VI STRP: ORAL_STRIP | 3 refills | Status: DC

## 2018-12-06 ENCOUNTER — Encounter: Payer: PPO | Admitting: Internal Medicine

## 2018-12-13 DIAGNOSIS — M21961 Unspecified acquired deformity of right lower leg: Secondary | ICD-10-CM | POA: Diagnosis not present

## 2018-12-13 DIAGNOSIS — M21962 Unspecified acquired deformity of left lower leg: Secondary | ICD-10-CM | POA: Diagnosis not present

## 2018-12-13 DIAGNOSIS — G5762 Lesion of plantar nerve, left lower limb: Secondary | ICD-10-CM | POA: Diagnosis not present

## 2018-12-13 DIAGNOSIS — G5761 Lesion of plantar nerve, right lower limb: Secondary | ICD-10-CM | POA: Diagnosis not present

## 2018-12-19 ENCOUNTER — Ambulatory Visit (INDEPENDENT_AMBULATORY_CARE_PROVIDER_SITE_OTHER): Payer: PPO | Admitting: Internal Medicine

## 2018-12-19 ENCOUNTER — Encounter: Payer: Self-pay | Admitting: Internal Medicine

## 2018-12-19 ENCOUNTER — Other Ambulatory Visit: Payer: Self-pay

## 2018-12-19 VITALS — BP 124/72 | HR 80 | Temp 98.1°F | Ht 64.6 in | Wt 166.6 lb

## 2018-12-19 DIAGNOSIS — E890 Postprocedural hypothyroidism: Secondary | ICD-10-CM

## 2018-12-19 DIAGNOSIS — E1122 Type 2 diabetes mellitus with diabetic chronic kidney disease: Secondary | ICD-10-CM

## 2018-12-19 DIAGNOSIS — H6122 Impacted cerumen, left ear: Secondary | ICD-10-CM | POA: Diagnosis not present

## 2018-12-19 DIAGNOSIS — E663 Overweight: Secondary | ICD-10-CM | POA: Diagnosis not present

## 2018-12-19 DIAGNOSIS — N182 Chronic kidney disease, stage 2 (mild): Secondary | ICD-10-CM

## 2018-12-19 DIAGNOSIS — I129 Hypertensive chronic kidney disease with stage 1 through stage 4 chronic kidney disease, or unspecified chronic kidney disease: Secondary | ICD-10-CM | POA: Diagnosis not present

## 2018-12-19 DIAGNOSIS — Z0000 Encounter for general adult medical examination without abnormal findings: Secondary | ICD-10-CM | POA: Diagnosis not present

## 2018-12-19 LAB — POCT URINALYSIS DIPSTICK
Bilirubin, UA: NEGATIVE
Glucose, UA: NEGATIVE
Ketones, UA: NEGATIVE
Nitrite, UA: NEGATIVE
Protein, UA: NEGATIVE
Spec Grav, UA: 1.01 (ref 1.010–1.025)
Urobilinogen, UA: 0.2 E.U./dL
pH, UA: 5.5 (ref 5.0–8.0)

## 2018-12-19 LAB — POCT UA - MICROALBUMIN
Creatinine, POC: 50 mg/dL
Microalbumin Ur, POC: 10 mg/L

## 2018-12-19 NOTE — Progress Notes (Signed)
Subjective:     Patient ID: Brooke Brown , female    DOB: 06-23-1950 , 68 y.o.   MRN: [id number]   Chief Complaint  Patient presents with  . Annual Exam  . Diabetes  . Hypertension    HPI  She is here today for a full physical examination. She reports she has been doing some walking in her neighborhood, did better when gym was open.   Diabetes She presents for her follow-up diabetic visit. She has type 2 diabetes mellitus. Her disease course has been stable. There are no hypoglycemic associated symptoms. Pertinent negatives for diabetes include no blurred vision and no chest pain. There are no hypoglycemic complications. Diabetic complications include nephropathy. Risk factors for coronary artery disease include diabetes mellitus, dyslipidemia, hypertension, post-menopausal and sedentary lifestyle. Her weight is stable. She is following a diabetic diet. She participates in exercise three times a week. Her breakfast blood glucose is taken between 8-9 am. Her breakfast blood glucose range is generally 110-130 mg/dl. Eye exam is current.  Hypertension This is a chronic problem. The current episode started more than 1 year ago. The problem has been gradually improving since onset. The problem is controlled. Pertinent negatives include no blurred vision, chest pain, palpitations or shortness of breath. Past treatments include calcium channel blockers, diuretics and angiotensin blockers. The current treatment provides moderate improvement. Hypertensive end-organ damage includes kidney disease.     Past Medical History:  Diagnosis Date  . Anxiety   . Arthritis   . Diabetes mellitus   . Grave's disease   . Grave's disease   . Hyperlipemia   . Hypertension      Family History  Problem Relation Age of Onset  . Pancreatic cancer Mother   . Heart failure Father   . Diabetes Brother   . Breast cancer Cousin      Current Outpatient Medications:  .  acetaminophen (TYLENOL) 500 MG  tablet, Take 500 mg by mouth daily as needed for moderate pain or headache., Disp: , Rfl:  .  amLODipine (NORVASC) 5 MG tablet, Take 1 tablet (5 mg total) by mouth daily., Disp: 90 tablet, Rfl: 1 .  aspirin EC 81 MG tablet, Take 81 mg by mouth daily., Disp: , Rfl:  .  calcium carbonate (OSCAL) 1500 (600 Ca) MG TABS tablet, Take 600 mg of elemental calcium by mouth daily., Disp: , Rfl:  .  Carboxymethylcellul-Glycerin (LUBRICATING EYE DROPS OP), Place 1 drop into both eyes 2 (two) times daily., Disp: , Rfl:  .  Cholecalciferol (VITAMIN D3) 5000 units CAPS, Take 5,000 Units by mouth daily., Disp: , Rfl:  .  hydrochlorothiazide (HYDRODIURIL) 25 MG tablet, Take 1 tablet by mouth once daily, Disp: 90 tablet, Rfl: 1 .  metFORMIN (GLUCOPHAGE-XR) 500 MG 24 hr tablet, Take 1 tablet (500 mg total) by mouth 2 (two) times daily., Disp: 180 tablet, Rfl: 2 .  Multiple Vitamin (MULTIVITAMIN WITH MINERALS) TABS tablet, Take 1 tablet by mouth daily., Disp: , Rfl:  .  olmesartan (BENICAR) 40 MG tablet, Take 1 tablet by mouth once daily, Disp: 90 tablet, Rfl: 1 .  ONETOUCH DELICA LANCETS 26O MISC, USE AS DIRECTED TO CHECK BLOOD SUGARS 1 TIME PER DAY, Disp: 100 each, Rfl: 11 .  ONETOUCH ULTRA test strip, Use as instructed to check blood sugars 1 time per day dx: e11.22, Disp: 100 each, Rfl: 3 .  Pitavastatin Calcium (LIVALO) 2 MG TABS, Take 1 tablet (2 mg total) by mouth daily., Disp: 90 tablet,  Rfl: 1 .  SYNTHROID 112 MCG tablet, Take 1 tablet (112 mcg total) by mouth daily., Disp: 90 tablet, Rfl: 1 .  traMADol (ULTRAM) 50 MG tablet, Take 1 tablet (50 mg total) by mouth every 6 (six) hours as needed. (Patient not taking: Reported on 12/19/2018), Disp: 30 tablet, Rfl: 0   No Known Allergies    The patient states she uses post menopausal status for birth control. Last LMP was No LMP recorded. Patient has had a hysterectomy.. Negative for Dysmenorrhea  Negative for: breast discharge, breast lump(s), breast pain and  breast self exam. Associated symptoms include abnormal vaginal bleeding. Pertinent negatives include abnormal bleeding (hematology), anxiety, decreased libido, depression, difficulty falling sleep, dyspareunia, history of infertility, nocturia, sexual dysfunction, sleep disturbances, urinary incontinence, urinary urgency, vaginal discharge and vaginal itching. Diet regular.The patient states her exercise level is    . The patient's tobacco use is:  Social History   Tobacco Use  Smoking Status Never Smoker  Smokeless Tobacco Never Used  . She has been exposed to passive smoke. The patient's alcohol use is:  Social History   Substance and Sexual Activity  Alcohol Use Not Currently  . Alcohol/week: 0.0 standard drinks   Comment: rarely    Review of Systems  Constitutional: Negative.   HENT: Negative.   Eyes: Negative.  Negative for blurred vision.  Respiratory: Negative.  Negative for shortness of breath.   Cardiovascular: Negative.  Negative for chest pain and palpitations.  Endocrine: Negative.   Genitourinary: Negative.   Musculoskeletal: Negative.   Skin: Negative.   Allergic/Immunologic: Negative.   Neurological: Negative.   Hematological: Negative.   Psychiatric/Behavioral: Negative.      Today's Vitals   12/19/18 1113  BP: 124/72  Pulse: 80  Temp: 98.1 F (36.7 C)  TempSrc: Oral  Weight: 166 lb 9.6 oz (75.6 kg)  Height: 5' 4.6" (1.641 m)  PainSc: 0-No pain   Body mass index is 28.07 kg/m.   Objective:  Physical Exam Vitals signs and nursing note reviewed.  Constitutional:      Appearance: Normal appearance.  HENT:     Head: Normocephalic and atraumatic.     Right Ear: Tympanic membrane, ear canal and external ear normal.     Left Ear: Ear canal and external ear normal. There is impacted cerumen.     Ears:     Comments: Hard cerumen present. Unable to visualize TM.    Nose: Nose normal.     Mouth/Throat:     Mouth: Mucous membranes are moist.      Pharynx: Oropharynx is clear.  Eyes:     Extraocular Movements: Extraocular movements intact.     Conjunctiva/sclera: Conjunctivae normal.     Pupils: Pupils are equal, round, and reactive to light.  Neck:     Musculoskeletal: Normal range of motion and neck supple.  Cardiovascular:     Rate and Rhythm: Normal rate and regular rhythm.     Pulses: Normal pulses.          Dorsalis pedis pulses are 2+ on the right side and 2+ on the left side.     Heart sounds: Normal heart sounds.  Pulmonary:     Effort: Pulmonary effort is normal.     Breath sounds: Normal breath sounds.  Chest:     Breasts: Tanner Score is 5.        Right: Normal. No swelling, bleeding, inverted nipple, mass, nipple discharge or skin change.  Left: Normal. No swelling, bleeding, inverted nipple, mass, nipple discharge or skin change.  Abdominal:     General: Abdomen is flat. Bowel sounds are normal.     Palpations: Abdomen is soft.  Genitourinary:    Comments: deferred Musculoskeletal: Normal range of motion.  Feet:     Right foot:     Protective Sensation: 5 sites tested. 5 sites sensed.     Skin integrity: Skin integrity normal.     Toenail Condition: Right toenails are normal.     Left foot:     Protective Sensation: 5 sites tested. 5 sites sensed.     Skin integrity: Skin integrity normal.     Toenail Condition: Left toenails are normal.  Skin:    General: Skin is warm and dry.  Neurological:     General: No focal deficit present.     Mental Status: She is alert and oriented to person, place, and time.  Psychiatric:        Mood and Affect: Mood normal.        Behavior: Behavior normal.         Assessment And Plan:     1. Health care maintenance  A full exam was performed. Importance of monthly self breast exams was discussed with the patient. PATIENT HAS BEEN ADVISED TO GET 30-45 MINUTES REGULAR EXERCISE NO LESS THAN FOUR TO FIVE DAYS PER WEEK - BOTH WEIGHTBEARING EXERCISES AND AEROBIC ARE  RECOMMENDED.  SHE IS ADVISED TO FOLLOW A HEALTHY DIET WITH AT LEAST SIX FRUITS/VEGGIES PER DAY, DECREASE INTAKE OF RED MEAT, AND TO INCREASE FISH INTAKE TO TWO DAYS PER WEEK.  MEATS/FISH SHOULD NOT BE FRIED, BAKED OR BROILED IS PREFERABLE.  I SUGGEST WEARING SPF 50 SUNSCREEN ON EXPOSED PARTS AND ESPECIALLY WHEN IN THE DIRECT SUNLIGHT FOR AN EXTENDED PERIOD OF TIME.  PLEASE AVOID FAST FOOD RESTAURANTS AND INCREASE YOUR WATER INTAKE.   2. Type 2 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 disease, without long-term current use of insulin ([place])  Diabetic foot exam was performed.  I DISCUSSED WITH THE PATIENT AT LENGTH REGARDING THE GOALS OF GLYCEMIC CONTROL AND POSSIBLE LONG-TERM COMPLICATIONS.  I  ALSO STRESSED THE IMPORTANCE OF COMPLIANCE WITH HOME GLUCOSE MONITORING, DIETARY RESTRICTIONS INCLUDING AVOIDANCE OF SUGARY DRINKS/PROCESSED FOODS,  ALONG WITH REGULAR EXERCISE.  I  ALSO STRESSED THE IMPORTANCE OF ANNUAL EYE EXAMS, SELF FOOT CARE AND COMPLIANCE WITH OFFICE VISITS.  - CMP14+EGFR - CBC - Lipid panel - Hemoglobin A1c  3. Parenchymal renal hypertension, stage 1 through stage 4 or unspecified chronic kidney disease   Well controlled. She will continue with current meds. She is encouraged to avoid adding salt to her foods. EKG performed, NSR w/ first degree AV block. She will rto in six months for re-evaluation.   - EKG 12-Lead  4. Postablative hypothyroidism  I will check thyroid panel and adjust meds as needed.  - TSH    5. Left ear impacted cerumen  AFTER OBTAINING VERBAL CONSENT, THE LEFT EAR WAS FLUSHED BY IRRIGATION. SHE TOLERATED PROCEDURE WELL WITHOUT ANY COMPLICATIONS. NO TM ABNORMALITIES WERE NOTED.   6. Overweight (BMI 25.0-29.9)  Importance of achieving optimal weight to decrease risk of cardiovascular disease and cancers was discussed with the patient in full detail. She is encouraged to start slowly - start with 10 minutes twice daily at least three to four days per  week and to gradually build to 30 minutes five days weekly. She was given tips to incorporate more activity into  her daily routine - take stairs when possible, park farther away from grocery stores, etc.       [name], MD    THE PATIENT IS ENCOURAGED TO PRACTICE SOCIAL DISTANCING DUE TO THE COVID-19 PANDEMIC.

## 2018-12-19 NOTE — Patient Instructions (Signed)
Health Maintenance, Female Adopting a healthy lifestyle and getting preventive care are important in promoting health and wellness. Ask your health care provider about:  The right schedule for you to have regular tests and exams.  Things you can do on your own to prevent diseases and keep yourself healthy. What should I know about diet, weight, and exercise? Eat a healthy diet   Eat a diet that includes plenty of vegetables, fruits, low-fat dairy products, and lean protein.  Do not eat a lot of foods that are high in solid fats, added sugars, or sodium. Maintain a healthy weight Body mass index (BMI) is used to identify weight problems. It estimates body fat based on height and weight. Your health care provider can help determine your BMI and help you achieve or maintain a healthy weight. Get regular exercise Get regular exercise. This is one of the most important things you can do for your health. Most adults should:  Exercise for at least 150 minutes each week. The exercise should increase your heart rate and make you sweat (moderate-intensity exercise).  Do strengthening exercises at least twice a week. This is in addition to the moderate-intensity exercise.  Spend less time sitting. Even light physical activity can be beneficial. Watch cholesterol and blood lipids Have your blood tested for lipids and cholesterol at 70 years of age, then have this test every 5 years. Have your cholesterol levels checked more often if:  Your lipid or cholesterol levels are high.  You are older than 70 years of age.  You are at high risk for heart disease. What should I know about cancer screening? Depending on your health history and family history, you may need to have cancer screening at various ages. This may include screening for:  Breast cancer.  Cervical cancer.  Colorectal cancer.  Skin cancer.  Lung cancer. What should I know about heart disease, diabetes, and high blood  pressure? Blood pressure and heart disease  High blood pressure causes heart disease and increases the risk of stroke. This is more likely to develop in people who have high blood pressure readings, are of African descent, or are overweight.  Have your blood pressure checked: ? Every 3-5 years if you are 18-39 years of age. ? Every year if you are 40 years old or older. Diabetes Have regular diabetes screenings. This checks your fasting blood sugar level. Have the screening done:  Once every three years after age 40 if you are at a normal weight and have a low risk for diabetes.  More often and at a younger age if you are overweight or have a high risk for diabetes. What should I know about preventing infection? Hepatitis B If you have a higher risk for hepatitis B, you should be screened for this virus. Talk with your health care provider to find out if you are at risk for hepatitis B infection. Hepatitis C Testing is recommended for:  Everyone born from 1945 through 1965.  Anyone with known risk factors for hepatitis C. Sexually transmitted infections (STIs)  Get screened for STIs, including gonorrhea and chlamydia, if: ? You are sexually active and are younger than 70 years of age. ? You are older than 70 years of age and your health care provider tells you that you are at risk for this type of infection. ? Your sexual activity has changed since you were last screened, and you are at increased risk for chlamydia or gonorrhea. Ask your health care provider if   you are at risk.  Ask your health care provider about whether you are at high risk for HIV. Your health care provider may recommend a prescription medicine to help prevent HIV infection. If you choose to take medicine to prevent HIV, you should first get tested for HIV. You should then be tested every 3 months for as long as you are taking the medicine. Pregnancy  If you are about to stop having your period (premenopausal) and  you may become pregnant, seek counseling before you get pregnant.  Take 400 to 800 micrograms (mcg) of folic acid every day if you become pregnant.  Ask for birth control (contraception) if you want to prevent pregnancy. Osteoporosis and menopause Osteoporosis is a disease in which the bones lose minerals and strength with aging. This can result in bone fractures. If you are 65 years old or older, or if you are at risk for osteoporosis and fractures, ask your health care provider if you should:  Be screened for bone loss.  Take a calcium or vitamin D supplement to lower your risk of fractures.  Be given hormone replacement therapy (HRT) to treat symptoms of menopause. Follow these instructions at home: Lifestyle  Do not use any products that contain nicotine or tobacco, such as cigarettes, e-cigarettes, and chewing tobacco. If you need help quitting, ask your health care provider.  Do not use street drugs.  Do not share needles.  Ask your health care provider for help if you need support or information about quitting drugs. Alcohol use  Do not drink alcohol if: ? Your health care provider tells you not to drink. ? You are pregnant, may be pregnant, or are planning to become pregnant.  If you drink alcohol: ? Limit how much you use to 0-1 drink a day. ? Limit intake if you are breastfeeding.  Be aware of how much alcohol is in your drink. In the [place] one drink equals one 12 oz bottle of beer (355 mL), one 5 oz glass of wine (148 mL), or one 1 oz glass of hard liquor (44 mL). General instructions  Schedule regular health, dental, and eye exams.  Stay current with your vaccines.  Tell your health care provider if: ? You often feel depressed. ? You have ever been abused or do not feel safe at home. Summary  Adopting a healthy lifestyle and getting preventive care are important in promoting health and wellness.  Follow your health care provider's instructions about healthy  diet, exercising, and getting tested or screened for diseases.  Follow your health care provider's instructions on monitoring your cholesterol and blood pressure. This information is not intended to replace advice given to you by your health care provider. Make sure you discuss any questions you have with your health care provider. Document Released: 11/24/2010 Document Revised: 05/04/2018 Document Reviewed: 05/04/2018 Elsevier Patient Education  2020 [employer]  

## 2018-12-20 LAB — CMP14+EGFR
ALT: 13 IU/L (ref 0–32)
AST: 25 IU/L (ref 0–40)
Albumin/Globulin Ratio: 1.5 (ref 1.2–2.2)
Albumin: 4.3 g/dL (ref 3.8–4.8)
Alkaline Phosphatase: 49 IU/L (ref 39–117)
BUN/Creatinine Ratio: 15 (ref 12–28)
BUN: 12 mg/dL (ref 8–27)
Bilirubin Total: <0.2 mg/dL (ref 0.0–1.2)
CO2: 24 mmol/L (ref 20–29)
Calcium: 9.7 mg/dL (ref 8.7–10.3)
Chloride: 103 mmol/L (ref 96–106)
Creatinine, Ser: 0.79 mg/dL (ref 0.57–1.00)
GFR calc Af Amer: 88 mL/min/{1.73_m2} (ref >59)
GFR calc non Af Amer: 77 mL/min/{1.73_m2} (ref >59)
Globulin, Total: 2.9 g/dL (ref 1.5–4.5)
Glucose: 99 mg/dL (ref 65–99)
Potassium: 3.8 mmol/L (ref 3.5–5.2)
Sodium: 142 mmol/L (ref 134–144)
Total Protein: 7.2 g/dL (ref 6.0–8.5)

## 2018-12-20 LAB — CBC
Hematocrit: 37.6 % (ref 34.0–46.6)
Hemoglobin: 12.7 g/dL (ref 11.1–15.9)
MCH: 28.5 pg (ref 26.6–33.0)
MCHC: 33.8 g/dL (ref 31.5–35.7)
MCV: 84 fL (ref 79–97)
Platelets: 351 10*3/uL (ref 150–450)
RBC: 4.46 x10E6/uL (ref 3.77–5.28)
RDW: 13.1 % (ref 11.7–15.4)
WBC: 4.8 10*3/uL (ref 3.4–10.8)

## 2018-12-20 LAB — LIPID PANEL
Chol/HDL Ratio: 2.8 ratio (ref 0.0–4.4)
Cholesterol, Total: 149 mg/dL (ref 100–199)
HDL: 53 mg/dL (ref >39)
LDL Calculated: 70 mg/dL (ref 0–99)
Triglycerides: 131 mg/dL (ref 0–149)
VLDL Cholesterol Cal: 26 mg/dL (ref 5–40)

## 2018-12-20 LAB — HEMOGLOBIN A1C
Est. average glucose Bld gHb Est-mCnc: 140 mg/dL
Hgb A1c MFr Bld: 6.5 % — ABNORMAL HIGH (ref 4.8–5.6)

## 2018-12-20 LAB — TSH: TSH: 1.18 u[IU]/mL (ref 0.450–4.500)

## 2018-12-27 ENCOUNTER — Other Ambulatory Visit: Payer: Self-pay | Admitting: *Deleted

## 2018-12-27 DIAGNOSIS — Z20822 Contact with and (suspected) exposure to covid-19: Secondary | ICD-10-CM

## 2018-12-29 LAB — NOVEL CORONAVIRUS, NAA: SARS-CoV-2, NAA: NOT DETECTED

## 2018-12-30 DIAGNOSIS — H43813 Vitreous degeneration, bilateral: Secondary | ICD-10-CM | POA: Diagnosis not present

## 2018-12-30 DIAGNOSIS — D3122 Benign neoplasm of left retina: Secondary | ICD-10-CM | POA: Diagnosis not present

## 2018-12-30 DIAGNOSIS — E0500 Thyrotoxicosis with diffuse goiter without thyrotoxic crisis or storm: Secondary | ICD-10-CM | POA: Diagnosis not present

## 2018-12-30 DIAGNOSIS — H04123 Dry eye syndrome of bilateral lacrimal glands: Secondary | ICD-10-CM | POA: Diagnosis not present

## 2019-01-03 ENCOUNTER — Other Ambulatory Visit: Payer: Self-pay | Admitting: Internal Medicine

## 2019-01-03 DIAGNOSIS — Z1231 Encounter for screening mammogram for malignant neoplasm of breast: Secondary | ICD-10-CM

## 2019-01-30 ENCOUNTER — Other Ambulatory Visit: Payer: Self-pay | Admitting: Internal Medicine

## 2019-01-31 ENCOUNTER — Ambulatory Visit (INDEPENDENT_AMBULATORY_CARE_PROVIDER_SITE_OTHER): Payer: PPO

## 2019-01-31 ENCOUNTER — Other Ambulatory Visit: Payer: Self-pay

## 2019-01-31 VITALS — BP 110/62 | HR 80 | Temp 98.5°F | Ht 64.8 in | Wt 166.6 lb

## 2019-01-31 DIAGNOSIS — Z23 Encounter for immunization: Secondary | ICD-10-CM

## 2019-01-31 NOTE — Progress Notes (Signed)
Patient presents today for a flu shot.  

## 2019-02-07 ENCOUNTER — Telehealth: Payer: Self-pay | Admitting: Internal Medicine

## 2019-02-07 NOTE — Chronic Care Management (AMB) (Signed)
Chronic Care Management   Note  02/07/2019 Name: Brooke Brown MRN: [id number] DOB: 1950/10/05  Brooke Brown is a 68 y.o. year old female who is a primary care patient of Glendale Chard, MD. I reached out to Sharene Butters by phone today in response to a referral sent by Ms. Corrie Mckusick Leabo's health plan.    Ms. Moseman was given information about Chronic Care Management services today including:  1. CCM service includes personalized support from designated clinical staff supervised by her physician, including individualized plan of care and coordination with other care providers 2. 24/7 contact phone numbers for assistance for urgent and routine care needs. 3. Service will only be billed when office clinical staff spend 20 minutes or more in a month to coordinate care. 4. Only one practitioner may furnish and bill the service in a calendar month. 5. The patient may stop CCM services at any time (effective at the end of the month) by phone call to the office staff. 6. The patient will be responsible for cost sharing (co-pay) of up to 20% of the service fee (after annual deductible is met).  Patient agreed to services and verbal consent obtained.   Follow up plan: Telephone appointment with CCM team member scheduled for: 02/20/2019  [place]  ??[email or web address]   ??[phone]

## 2019-02-16 ENCOUNTER — Other Ambulatory Visit: Payer: Self-pay

## 2019-02-16 ENCOUNTER — Ambulatory Visit
Admission: RE | Admit: 2019-02-16 | Discharge: 2019-02-16 | Disposition: A | Discharge status: Home / Self Care | Payer: PPO | Source: Ambulatory Visit | Attending: Internal Medicine | Admitting: Internal Medicine

## 2019-02-16 DIAGNOSIS — Z1231 Encounter for screening mammogram for malignant neoplasm of breast: Secondary | ICD-10-CM | POA: Diagnosis not present

## 2019-02-20 ENCOUNTER — Telehealth: Payer: PPO

## 2019-03-03 ENCOUNTER — Other Ambulatory Visit: Payer: Self-pay | Admitting: Internal Medicine

## 2019-03-06 ENCOUNTER — Encounter: Payer: Self-pay | Admitting: Internal Medicine

## 2019-03-07 ENCOUNTER — Telehealth: Payer: Self-pay

## 2019-03-07 ENCOUNTER — Other Ambulatory Visit: Payer: Self-pay

## 2019-03-07 ENCOUNTER — Encounter: Payer: Self-pay | Admitting: Nurse Practitioner

## 2019-03-07 ENCOUNTER — Ambulatory Visit (INDEPENDENT_AMBULATORY_CARE_PROVIDER_SITE_OTHER): Payer: PPO | Admitting: Nurse Practitioner

## 2019-03-07 VITALS — BP 124/70 | HR 63 | Temp 98.2°F | Ht 64.4 in | Wt 166.4 lb

## 2019-03-07 DIAGNOSIS — N6324 Unspecified lump in the left breast, lower inner quadrant: Secondary | ICD-10-CM

## 2019-03-07 DIAGNOSIS — N644 Mastodynia: Secondary | ICD-10-CM

## 2019-03-07 DIAGNOSIS — N6323 Unspecified lump in the left breast, lower outer quadrant: Secondary | ICD-10-CM

## 2019-03-07 NOTE — Telephone Encounter (Signed)
The pt was called and scheduled an appt for evaluation of left breast pain.

## 2019-03-07 NOTE — Progress Notes (Signed)
Subjective:     Patient ID: Brooke Brown , female    DOB: Feb 08, 1951 , 68 y.o.   MRN: [id number]   Chief Complaint  Patient presents with  . Breast Pain    patient stated when she touched her left breast she noticed it was a little tender. she stated the tenderness started on saturday.she stated sometimes she has a pain under her arm    HPI  Brooke Brown is here today for left breast pain tenderness, on Saturday felt tenderness on left breast.  Denies discharge.  She was unable to feel anything in the breast, denies redness. Had mammogram 02/16/2019.      Past Medical History:  Diagnosis Date  . Anxiety   . Arthritis   . Diabetes mellitus   . Grave's disease   . Grave's disease   . Hyperlipemia   . Hypertension      Family History  Problem Relation Age of Onset  . Pancreatic cancer Mother   . Heart failure Father   . Diabetes Brother   . Breast cancer Cousin      Current Outpatient Medications:  .  acetaminophen (TYLENOL) 500 MG tablet, Take 500 mg by mouth daily as needed for moderate pain or headache., Disp: , Rfl:  .  amLODipine (NORVASC) 5 MG tablet, Take 1 tablet by mouth once daily, Disp: 30 tablet, Rfl: 0 .  aspirin EC 81 MG tablet, Take 81 mg by mouth daily., Disp: , Rfl:  .  calcium carbonate (OSCAL) 1500 (600 Ca) MG TABS tablet, Take 600 mg of elemental calcium by mouth daily., Disp: , Rfl:  .  Carboxymethylcellul-Glycerin (LUBRICATING EYE DROPS OP), Place 1 drop into both eyes 2 (two) times daily., Disp: , Rfl:  .  Cholecalciferol (VITAMIN D3) 5000 units CAPS, Take 5,000 Units by mouth daily., Disp: , Rfl:  .  hydrochlorothiazide (HYDRODIURIL) 25 MG tablet, Take 1 tablet by mouth once daily, Disp: 90 tablet, Rfl: 1 .  LIVALO 2 MG TABS, Take 1 tablet by mouth once daily, Disp: 90 tablet, Rfl: 0 .  metFORMIN (GLUCOPHAGE-XR) 500 MG 24 hr tablet, Take 1 tablet (500 mg total) by mouth 2 (two) times daily., Disp: 180 tablet, Rfl: 2 .  Multiple Vitamin  (MULTIVITAMIN WITH MINERALS) TABS tablet, Take 1 tablet by mouth daily., Disp: , Rfl:  .  olmesartan (BENICAR) 40 MG tablet, Take 1 tablet by mouth once daily, Disp: 90 tablet, Rfl: 1 .  ONETOUCH DELICA LANCETS 99991111 MISC, USE AS DIRECTED TO CHECK BLOOD SUGARS 1 TIME PER DAY, Disp: 100 each, Rfl: 11 .  ONETOUCH ULTRA test strip, Use as instructed to check blood sugars 1 time per day dx: e11.22, Disp: 100 each, Rfl: 3 .  SYNTHROID 112 MCG tablet, Take 1 tablet (112 mcg total) by mouth daily., Disp: 90 tablet, Rfl: 1 .  traMADol (ULTRAM) 50 MG tablet, Take 1 tablet (50 mg total) by mouth every 6 (six) hours as needed. (Patient not taking: Reported on 12/19/2018), Disp: 30 tablet, Rfl: 0   No Known Allergies   Review of Systems  Constitutional: Negative.   Respiratory: Negative.   Cardiovascular: Negative.  Negative for chest pain, palpitations and leg swelling.  Skin: Negative.   Neurological: Negative for dizziness and headaches.     Today's Vitals   03/07/19 1036  BP: 124/70  Pulse: 63  Temp: 98.2 F (36.8 C)  TempSrc: Oral  Weight: 166 lb 6.4 oz (75.5 kg)  Height: 5' 4.4" (1.636 m)  PainSc: 2    Body mass index is 28.21 kg/m.   Objective:  Physical Exam Constitutional:      Appearance: Normal appearance.  Chest:     Breasts:        Right: Normal.        Left: Mass and tenderness present. No swelling, inverted nipple, nipple discharge or skin change.       Comments: Lump palpated at 5 o'clock  Neurological:     General: No focal deficit present.     Mental Status: She is alert and oriented to person, place, and time.         Assessment And Plan:     1. Breast tenderness  Left inner nipple at 5 o'clock tenderness on palpation  2. Lump in lower inner quadrant of left breast  Lump palpated to left nipple 5 o'clock  Will send for diagnostic mammogram and ultrasound   Minette Brine, FNP    THE PATIENT IS ENCOURAGED TO PRACTICE SOCIAL DISTANCING DUE TO THE  COVID-19 PANDEMIC.

## 2019-03-15 ENCOUNTER — Encounter: Payer: Self-pay | Admitting: Internal Medicine

## 2019-03-20 ENCOUNTER — Ambulatory Visit
Admission: RE | Admit: 2019-03-20 | Discharge: 2019-03-20 | Disposition: A | Discharge status: Home / Self Care | Payer: PPO | Source: Ambulatory Visit | Attending: Nurse Practitioner | Admitting: Nurse Practitioner

## 2019-03-20 ENCOUNTER — Other Ambulatory Visit: Payer: Self-pay

## 2019-03-20 DIAGNOSIS — N6489 Other specified disorders of breast: Secondary | ICD-10-CM | POA: Diagnosis not present

## 2019-03-20 DIAGNOSIS — N644 Mastodynia: Secondary | ICD-10-CM

## 2019-03-20 DIAGNOSIS — N6324 Unspecified lump in the left breast, lower inner quadrant: Secondary | ICD-10-CM

## 2019-03-20 DIAGNOSIS — R928 Other abnormal and inconclusive findings on diagnostic imaging of breast: Secondary | ICD-10-CM | POA: Diagnosis not present

## 2019-03-28 ENCOUNTER — Telehealth: Payer: Self-pay

## 2019-03-29 ENCOUNTER — Ambulatory Visit (INDEPENDENT_AMBULATORY_CARE_PROVIDER_SITE_OTHER): Payer: PPO | Admitting: Internal Medicine

## 2019-03-29 ENCOUNTER — Encounter: Payer: Self-pay | Admitting: Internal Medicine

## 2019-03-29 ENCOUNTER — Other Ambulatory Visit: Payer: Self-pay

## 2019-03-29 VITALS — BP 126/74 | HR 77 | Temp 98.5°F | Ht 64.4 in | Wt 167.8 lb

## 2019-03-29 DIAGNOSIS — I129 Hypertensive chronic kidney disease with stage 1 through stage 4 chronic kidney disease, or unspecified chronic kidney disease: Secondary | ICD-10-CM | POA: Diagnosis not present

## 2019-03-29 DIAGNOSIS — E7800 Pure hypercholesterolemia, unspecified: Secondary | ICD-10-CM | POA: Insufficient documentation

## 2019-03-29 DIAGNOSIS — N182 Chronic kidney disease, stage 2 (mild): Secondary | ICD-10-CM | POA: Diagnosis not present

## 2019-03-29 DIAGNOSIS — E1122 Type 2 diabetes mellitus with diabetic chronic kidney disease: Secondary | ICD-10-CM | POA: Diagnosis not present

## 2019-03-29 DIAGNOSIS — M4802 Spinal stenosis, cervical region: Secondary | ICD-10-CM

## 2019-03-29 DIAGNOSIS — E663 Overweight: Secondary | ICD-10-CM | POA: Diagnosis not present

## 2019-03-29 DIAGNOSIS — R252 Cramp and spasm: Secondary | ICD-10-CM

## 2019-03-29 MED ORDER — OLMESARTAN MEDOXOMIL 40 MG PO TABS: 40.0000 mg | ORAL_TABLET | Freq: Every day | ORAL | 1 refills | Status: DC

## 2019-03-29 MED ORDER — LIVALO 2 MG PO TABS: 1.0000 | ORAL_TABLET | Freq: Every day | ORAL | 0 refills | Status: DC

## 2019-03-29 MED ORDER — HYDROCHLOROTHIAZIDE 25 MG PO TABS: 25.0000 mg | ORAL_TABLET | Freq: Every day | ORAL | 1 refills | Status: DC

## 2019-03-29 MED ORDER — AMLODIPINE BESYLATE 5 MG PO TABS: 5.0000 mg | ORAL_TABLET | Freq: Every day | ORAL | 2 refills | Status: DC

## 2019-03-29 NOTE — Progress Notes (Signed)
Subjective:     Patient ID: Brooke Brown , female    DOB: 06-06-1950 , 68 y.o.   MRN: [id number]   Chief Complaint  Patient presents with  . Diabetes  . Hypertension    HPI  Diabetes She presents for her follow-up diabetic visit. She has type 2 diabetes mellitus. Her disease course has been stable. There are no hypoglycemic associated symptoms. Pertinent negatives for diabetes include no blurred vision and no chest pain. There are no hypoglycemic complications. Diabetic complications include nephropathy. Risk factors for coronary artery disease include diabetes mellitus, dyslipidemia, hypertension, post-menopausal and sedentary lifestyle. Her weight is stable. She is following a diabetic diet. She participates in exercise three times a week. Her breakfast blood glucose is taken between 8-9 am. Her breakfast blood glucose range is generally 110-130 mg/dl. Eye exam is current.  Hypertension This is a chronic problem. The current episode started more than 1 year ago. The problem has been gradually improving since onset. The problem is controlled. Pertinent negatives include no blurred vision, chest pain, palpitations or shortness of breath.     Past Medical History:  Diagnosis Date  . Anxiety   . Arthritis   . Diabetes mellitus   . Grave's disease   . Grave's disease   . Hyperlipemia   . Hypertension      Family History  Problem Relation Age of Onset  . Pancreatic cancer Mother   . Heart failure Father   . Diabetes Brother   . Breast cancer Cousin      Current Outpatient Medications:  .  acetaminophen (TYLENOL) 500 MG tablet, Take 500 mg by mouth daily as needed for moderate pain or headache., Disp: , Rfl:  .  amLODipine (NORVASC) 5 MG tablet, Take 1 tablet by mouth once daily, Disp: 30 tablet, Rfl: 0 .  aspirin EC 81 MG tablet, Take 81 mg by mouth daily., Disp: , Rfl:  .  calcium carbonate (OSCAL) 1500 (600 Ca) MG TABS tablet, Take 600 mg of elemental calcium by  mouth daily., Disp: , Rfl:  .  Carboxymethylcellul-Glycerin (LUBRICATING EYE DROPS OP), Place 1 drop into both eyes 2 (two) times daily., Disp: , Rfl:  .  Cholecalciferol (VITAMIN D3) 5000 units CAPS, Take 5,000 Units by mouth daily., Disp: , Rfl:  .  hydrochlorothiazide (HYDRODIURIL) 25 MG tablet, Take 1 tablet by mouth once daily, Disp: 90 tablet, Rfl: 1 .  LIVALO 2 MG TABS, Take 1 tablet by mouth once daily, Disp: 90 tablet, Rfl: 0 .  metFORMIN (GLUCOPHAGE-XR) 500 MG 24 hr tablet, Take 1 tablet (500 mg total) by mouth 2 (two) times daily., Disp: 180 tablet, Rfl: 2 .  Multiple Vitamin (MULTIVITAMIN WITH MINERALS) TABS tablet, Take 1 tablet by mouth daily., Disp: , Rfl:  .  olmesartan (BENICAR) 40 MG tablet, Take 1 tablet by mouth once daily, Disp: 90 tablet, Rfl: 1 .  ONETOUCH DELICA LANCETS 35D MISC, USE AS DIRECTED TO CHECK BLOOD SUGARS 1 TIME PER DAY, Disp: 100 each, Rfl: 11 .  ONETOUCH ULTRA test strip, Use as instructed to check blood sugars 1 time per day dx: e11.22, Disp: 100 each, Rfl: 3 .  SYNTHROID 112 MCG tablet, Take 1 tablet (112 mcg total) by mouth daily., Disp: 90 tablet, Rfl: 1 .  traMADol (ULTRAM) 50 MG tablet, Take 1 tablet (50 mg total) by mouth every 6 (six) hours as needed., Disp: 30 tablet, Rfl: 0   No Known Allergies   Review of Systems  Constitutional:  Negative.   Eyes: Negative for blurred vision.  Respiratory: Negative.  Negative for shortness of breath.   Cardiovascular: Negative.  Negative for chest pain and palpitations.  Gastrointestinal: Negative.   Musculoskeletal:       She c/o muscle cramps. Usually occur at night. Unable to determine any other triggers. Denies UE/LE weakness.   Also with neck pain. Has occ. Tingling in upper extremities.   Neurological: Negative.   Psychiatric/Behavioral: Negative.      Today's Vitals   03/29/19 1411  BP: 126/74  Pulse: 77  Temp: 98.5 F (36.9 C)  TempSrc: Oral  Weight: 167 lb 12.8 oz (76.1 kg)  Height: 5'  4.4" (1.636 m)  PainSc: 0-No pain   Body mass index is 28.45 kg/m.   Objective:  Physical Exam Vitals signs and nursing note reviewed.  Constitutional:      Appearance: Normal appearance.  HENT:     Head: Normocephalic and atraumatic.  Cardiovascular:     Rate and Rhythm: Normal rate and regular rhythm.     Heart sounds: Normal heart sounds.  Pulmonary:     Effort: Pulmonary effort is normal.     Breath sounds: Normal breath sounds.  Skin:    General: Skin is warm.  Neurological:     General: No focal deficit present.     Mental Status: She is alert.  Psychiatric:        Mood and Affect: Mood normal.        Behavior: Behavior normal.         Assessment And Plan:     1. Type 2 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 disease, without long-term current use of insulin ([place])  I will check labs as listed below.  Importance of regular exercise was discussed with the patient. I will adjust meds as needed.   - Hemoglobin A1c - BMP8+EGFR  2. Parenchymal renal hypertension, stage 1 through stage 4 or unspecified chronic kidney disease  Chronic, well controlled. She will continue with current meds. She is encouraged to avoid adding salt to her foods.   3. Pure hypercholesterolemia  Previous labs from July 2020 were reviewed in full detail. LDL 70 - she will continue with current meds.    4. Muscle cramp  She is advised to continue with magnesium supplementation. She is not sure what dose she is taking. She agrees to call me later today with the dose. I will likely increase this. She is aware that diarrhea will develop if she is taking too much magnesium.   - Magnesium  5. Cervical spinal stenosis  MRI results reviewed in full detail.  She is followed by Ortho. She may benefit from ART chiropractic therapy.   6. Overweight (BMI 25.0-29.9)  BMI 28. She is encouraged to strive for BMI less than 25 to decrease cardiac risk. She was commended on her regular exercise  regimen.     [name], MD    THE PATIENT IS ENCOURAGED TO PRACTICE SOCIAL DISTANCING DUE TO THE COVID-19 PANDEMIC.

## 2019-03-29 NOTE — Patient Instructions (Signed)
Spinal Stenosis ° °Spinal stenosis happens when the open space (spinal canal) between the bones of your spine (vertebrae) gets smaller. It is caused by bone pushing into the open spaces of your backbone (spine). This puts pressure on your backbone and the nerves in your backbone. Treatment often focuses on managing any pain and symptoms. In some cases, surgery may be needed. °Follow these instructions at home: °Managing pain, stiffness, and swelling ° °· Do all exercises and stretches as told by your doctor. °· Stand and sit up straight (use good posture). If you were given a brace or a corset, wear it as told by your doctor. °· Do not do any activities that cause pain. Ask your doctor what activities are safe for you. °· Do not lift anything that is heavier than 10 lb (4.5 kg) or heavier than your doctor tells you. °· Try to stay at a healthy weight. Talk with your doctor if you need help losing weight. °· If directed, put heat on the affected area as often as told by your doctor. Use the heat source that your doctor recommends, such as a moist heat pack or a heating pad. °? Put a towel between your skin and the heat source. °? Leave the heat on for 20-30 minutes. °? Remove the heat if your skin turns bright red. This is especially important if you are not able to feel pain, heat, or cold. You may have a greater risk of getting burned. °General instructions °· Take over-the-counter and prescription medicines only as told by your doctor. °· Do not use any products that contain nicotine or tobacco, such as cigarettes and e-cigarettes. If you need help quitting, ask your doctor. °· Eat a healthy diet. This includes plenty of fruits and vegetables, whole grains, and low-fat (lean) protein. °· Keep all follow-up visits as told by your doctor. This is important. °Contact a doctor if: °· Your symptoms do not get better. °· Your symptoms get worse. °· You have a fever. °Get help right away if: °· You have new or worse pain  in your neck or upper back. °· You have very bad pain that medicine does not control. °· You are dizzy. °· You have vision problems, blurred vision, or double vision. °· You have a very bad headache that is worse when you stand. °· You feel sick to your stomach (nauseous). °· You throw up (vomit). °· You have new or worse numbness or tingling in your back or legs. °· You have pain, redness, swelling, or warmth in your arm or leg. °Summary °· Spinal stenosis happens when the open space (spinal canal) between the bones of your spine gets smaller (narrow). °· Contact a doctor if your symptoms get worse. °· In some cases, surgery may be needed. °This information is not intended to replace advice given to you by your health care provider. Make sure you discuss any questions you have with your health care provider. °Document Released: 09/04/2010 Document Revised: 04/23/2017 Document Reviewed: 04/15/2016 °Elsevier Patient Education © 2020 Elsevier Inc. ° °

## 2019-03-30 ENCOUNTER — Other Ambulatory Visit: Payer: Self-pay

## 2019-03-30 LAB — BMP8+EGFR
BUN/Creatinine Ratio: 16 (ref 12–28)
BUN: 14 mg/dL (ref 8–27)
CO2: 24 mmol/L (ref 20–29)
Calcium: 10.9 mg/dL — ABNORMAL HIGH (ref 8.7–10.3)
Chloride: 102 mmol/L (ref 96–106)
Creatinine, Ser: 0.85 mg/dL (ref 0.57–1.00)
GFR calc Af Amer: 80 mL/min/{1.73_m2} (ref >59)
GFR calc non Af Amer: 70 mL/min/{1.73_m2} (ref >59)
Glucose: 93 mg/dL (ref 65–99)
Potassium: 4.2 mmol/L (ref 3.5–5.2)
Sodium: 141 mmol/L (ref 134–144)

## 2019-03-30 LAB — HEMOGLOBIN A1C
Est. average glucose Bld gHb Est-mCnc: 143 mg/dL
Hgb A1c MFr Bld: 6.6 % — ABNORMAL HIGH (ref 4.8–5.6)

## 2019-03-30 LAB — MAGNESIUM: Magnesium: 2.2 mg/dL (ref 1.6–2.3)

## 2019-03-30 MED ORDER — MAGNESIUM 250 MG PO TABS: ORAL_TABLET | ORAL | 0 refills | Status: AC

## 2019-04-01 ENCOUNTER — Other Ambulatory Visit: Payer: Self-pay | Admitting: Internal Medicine

## 2019-04-14 ENCOUNTER — Other Ambulatory Visit: Payer: Self-pay

## 2019-04-14 DIAGNOSIS — Z20822 Contact with and (suspected) exposure to covid-19: Secondary | ICD-10-CM

## 2019-04-16 ENCOUNTER — Other Ambulatory Visit: Payer: Self-pay | Admitting: Internal Medicine

## 2019-04-17 LAB — NOVEL CORONAVIRUS, NAA: SARS-CoV-2, NAA: NOT DETECTED

## 2019-04-19 ENCOUNTER — Encounter: Payer: Self-pay | Admitting: Internal Medicine

## 2019-05-04 ENCOUNTER — Other Ambulatory Visit: Payer: Self-pay | Admitting: Internal Medicine

## 2019-05-11 ENCOUNTER — Other Ambulatory Visit: Payer: Self-pay

## 2019-05-11 DIAGNOSIS — Z20822 Contact with and (suspected) exposure to covid-19: Secondary | ICD-10-CM

## 2019-05-11 DIAGNOSIS — Z20828 Contact with and (suspected) exposure to other viral communicable diseases: Secondary | ICD-10-CM | POA: Diagnosis not present

## 2019-05-12 LAB — NOVEL CORONAVIRUS, NAA: SARS-CoV-2, NAA: NOT DETECTED

## 2019-05-29 ENCOUNTER — Telehealth: Payer: Self-pay

## 2019-06-26 DIAGNOSIS — H26491 Other secondary cataract, right eye: Secondary | ICD-10-CM | POA: Diagnosis not present

## 2019-06-26 DIAGNOSIS — H43813 Vitreous degeneration, bilateral: Secondary | ICD-10-CM | POA: Diagnosis not present

## 2019-06-26 DIAGNOSIS — E119 Type 2 diabetes mellitus without complications: Secondary | ICD-10-CM | POA: Diagnosis not present

## 2019-06-26 DIAGNOSIS — H33321 Round hole, right eye: Secondary | ICD-10-CM | POA: Diagnosis not present

## 2019-06-26 DIAGNOSIS — Z961 Presence of intraocular lens: Secondary | ICD-10-CM | POA: Diagnosis not present

## 2019-06-26 LAB — HM DIABETES EYE EXAM

## 2019-06-27 ENCOUNTER — Encounter (INDEPENDENT_AMBULATORY_CARE_PROVIDER_SITE_OTHER): Payer: PPO | Admitting: Ophthalmology

## 2019-06-27 DIAGNOSIS — H35033 Hypertensive retinopathy, bilateral: Secondary | ICD-10-CM | POA: Diagnosis not present

## 2019-06-27 DIAGNOSIS — D3132 Benign neoplasm of left choroid: Secondary | ICD-10-CM | POA: Diagnosis not present

## 2019-06-27 DIAGNOSIS — I10 Essential (primary) hypertension: Secondary | ICD-10-CM | POA: Diagnosis not present

## 2019-06-27 DIAGNOSIS — H33301 Unspecified retinal break, right eye: Secondary | ICD-10-CM | POA: Diagnosis not present

## 2019-07-10 ENCOUNTER — Encounter (INDEPENDENT_AMBULATORY_CARE_PROVIDER_SITE_OTHER): Payer: PPO | Admitting: Ophthalmology

## 2019-07-10 DIAGNOSIS — H33301 Unspecified retinal break, right eye: Secondary | ICD-10-CM

## 2019-07-21 ENCOUNTER — Other Ambulatory Visit: Payer: Self-pay | Admitting: Internal Medicine

## 2019-07-25 ENCOUNTER — Ambulatory Visit: Payer: Self-pay

## 2019-07-25 ENCOUNTER — Other Ambulatory Visit: Payer: Self-pay

## 2019-07-25 ENCOUNTER — Telehealth: Payer: Self-pay

## 2019-07-25 DIAGNOSIS — E1122 Type 2 diabetes mellitus with diabetic chronic kidney disease: Secondary | ICD-10-CM

## 2019-07-25 DIAGNOSIS — I129 Hypertensive chronic kidney disease with stage 1 through stage 4 chronic kidney disease, or unspecified chronic kidney disease: Secondary | ICD-10-CM

## 2019-07-25 DIAGNOSIS — N182 Chronic kidney disease, stage 2 (mild): Secondary | ICD-10-CM

## 2019-07-25 DIAGNOSIS — E7800 Pure hypercholesterolemia, unspecified: Secondary | ICD-10-CM

## 2019-07-27 NOTE — Chronic Care Management (AMB) (Signed)
  Chronic Care Management   Outreach Note  07/27/2019 Name: Brooke Brown MRN: [id number] DOB: January 16, 1951  Referred by: Glendale Chard, MD Reason for referral : Chronic Care Management ([facility] Initial Call - DMII, HTN, Hypercholesterolemia)   An unsuccessful telephone outreach was attempted today. The patient was referred to the case management team for assistance with care management and care coordination.   Follow Up Plan: A HIPPA compliant phone message was left for the patient providing contact information and requesting a return call.  Telephone follow up appointment with care management team member scheduled for: 08/21/19  Barb Merino, RN, BSN, CCM Care Management Coordinator [facility]  Direct Phone: [phone]

## 2019-08-04 ENCOUNTER — Other Ambulatory Visit: Payer: Self-pay | Admitting: Internal Medicine

## 2019-08-05 MED ORDER — LIVALO 2 MG PO TABS: 1.0000 | ORAL_TABLET | Freq: Every day | ORAL | 0 refills | Status: DC

## 2019-08-10 ENCOUNTER — Other Ambulatory Visit: Payer: Self-pay

## 2019-08-10 ENCOUNTER — Encounter: Payer: Self-pay | Admitting: Internal Medicine

## 2019-08-10 ENCOUNTER — Ambulatory Visit (INDEPENDENT_AMBULATORY_CARE_PROVIDER_SITE_OTHER): Payer: PPO

## 2019-08-10 ENCOUNTER — Ambulatory Visit (INDEPENDENT_AMBULATORY_CARE_PROVIDER_SITE_OTHER): Payer: PPO | Admitting: Internal Medicine

## 2019-08-10 VITALS — BP 124/78 | HR 75 | Temp 98.3°F | Ht 64.2 in | Wt 165.0 lb

## 2019-08-10 DIAGNOSIS — E1122 Type 2 diabetes mellitus with diabetic chronic kidney disease: Secondary | ICD-10-CM | POA: Diagnosis not present

## 2019-08-10 DIAGNOSIS — N182 Chronic kidney disease, stage 2 (mild): Secondary | ICD-10-CM

## 2019-08-10 DIAGNOSIS — E890 Postprocedural hypothyroidism: Secondary | ICD-10-CM | POA: Diagnosis not present

## 2019-08-10 DIAGNOSIS — R8290 Unspecified abnormal findings in urine: Secondary | ICD-10-CM | POA: Diagnosis not present

## 2019-08-10 DIAGNOSIS — Z6828 Body mass index (BMI) 28.0-28.9, adult: Secondary | ICD-10-CM | POA: Diagnosis not present

## 2019-08-10 DIAGNOSIS — Z0000 Encounter for general adult medical examination without abnormal findings: Secondary | ICD-10-CM

## 2019-08-10 DIAGNOSIS — E663 Overweight: Secondary | ICD-10-CM

## 2019-08-10 DIAGNOSIS — I129 Hypertensive chronic kidney disease with stage 1 through stage 4 chronic kidney disease, or unspecified chronic kidney disease: Secondary | ICD-10-CM

## 2019-08-10 LAB — POCT UA - MICROALBUMIN
Creatinine, POC: 300 mg/dL
Microalbumin Ur, POC: 80 mg/L

## 2019-08-10 LAB — POCT URINALYSIS DIPSTICK
Glucose, UA: NEGATIVE
Ketones, UA: NEGATIVE
Nitrite, UA: NEGATIVE
Protein, UA: POSITIVE — AB
Spec Grav, UA: >=1.03 — AB (ref 1.010–1.025)
Urobilinogen, UA: 0.2 E.U./dL
pH, UA: 5 (ref 5.0–8.0)

## 2019-08-10 NOTE — Patient Instructions (Signed)
Diabetes Mellitus and Foot Care Foot care is an important part of your health, especially when you have diabetes. Diabetes may cause you to have problems because of poor blood flow (circulation) to your feet and legs, which can cause your skin to:  Become thinner and drier.  Break more easily.  Heal more slowly.  Peel and crack. You may also have nerve damage (neuropathy) in your legs and feet, causing decreased feeling in them. This means that you may not notice minor injuries to your feet that could lead to more serious problems. Noticing and addressing any potential problems early is the best way to prevent future foot problems. How to care for your feet Foot hygiene  Wash your feet daily with warm water and mild soap. Do not use hot water. Then, pat your feet and the areas between your toes until they are completely dry. Do not soak your feet as this can dry your skin.  Trim your toenails straight across. Do not dig under them or around the cuticle. File the edges of your nails with an emery board or nail file.  Apply a moisturizing lotion or petroleum jelly to the skin on your feet and to dry, brittle toenails. Use lotion that does not contain alcohol and is unscented. Do not apply lotion between your toes. Shoes and socks  Wear clean socks or stockings every day. Make sure they are not too tight. Do not wear knee-high stockings since they may decrease blood flow to your legs.  Wear shoes that fit properly and have enough cushioning. Always look in your shoes before you put them on to be sure there are no objects inside.  To break in new shoes, wear them for just a few hours a day. This prevents injuries on your feet. Wounds, scrapes, corns, and calluses  Check your feet daily for blisters, cuts, bruises, sores, and redness. If you cannot see the bottom of your feet, use a mirror or ask someone for help.  Do not cut corns or calluses or try to remove them with medicine.  If you  find a minor scrape, cut, or break in the skin on your feet, keep it and the skin around it clean and dry. You may clean these areas with mild soap and water. Do not clean the area with peroxide, alcohol, or iodine.  If you have a wound, scrape, corn, or callus on your foot, look at it several times a day to make sure it is healing and not infected. Check for: ? Redness, swelling, or pain. ? Fluid or blood. ? Warmth. ? Pus or a bad smell. General instructions  Do not cross your legs. This may decrease blood flow to your feet.  Do not use heating pads or hot water bottles on your feet. They may burn your skin. If you have lost feeling in your feet or legs, you may not know this is happening until it is too late.  Protect your feet from hot and cold by wearing shoes, such as at the beach or on hot pavement.  Schedule a complete foot exam at least once a year (annually) or more often if you have foot problems. If you have foot problems, report any cuts, sores, or bruises to your health care provider immediately. Contact a health care provider if:  You have a medical condition that increases your risk of infection and you have any cuts, sores, or bruises on your feet.  You have an injury that is not   healing.  You have redness on your legs or feet.  You feel burning or tingling in your legs or feet.  You have pain or cramps in your legs and feet.  Your legs or feet are numb.  Your feet always feel cold.  You have pain around a toenail. Get help right away if:  You have a wound, scrape, corn, or callus on your foot and: ? You have pain, swelling, or redness that gets worse. ? You have fluid or blood coming from the wound, scrape, corn, or callus. ? Your wound, scrape, corn, or callus feels warm to the touch. ? You have pus or a bad smell coming from the wound, scrape, corn, or callus. ? You have a fever. ? You have a red line going up your leg. Summary  Check your feet every day  for cuts, sores, red spots, swelling, and blisters.  Moisturize feet and legs daily.  Wear shoes that fit properly and have enough cushioning.  If you have foot problems, report any cuts, sores, or bruises to your health care provider immediately.  Schedule a complete foot exam at least once a year (annually) or more often if you have foot problems. This information is not intended to replace advice given to you by your health care provider. Make sure you discuss any questions you have with your health care provider. Document Revised: 02/01/2019 Document Reviewed: 06/12/2016 Elsevier Patient Education  2020 Elsevier Inc.  

## 2019-08-10 NOTE — Patient Instructions (Signed)
Brooke Brown , Thank you for taking time to come for your Medicare Wellness Visit. I appreciate your ongoing commitment to your health goals. Please review the following plan we discussed and let me know if I can assist you in the future.   Screening recommendations/referrals: Colonoscopy: 10/2018 Mammogram: 02/2019 Bone Density: 01/2018 Recommended yearly ophthalmology/optometry visit for glaucoma screening and checkup Recommended yearly dental visit for hygiene and checkup  Vaccinations: Influenza vaccine: 01/2019 Pneumococcal vaccine: 07/2018 Tdap vaccine: 12/2011 Shingles vaccine: discussed    Advanced directives: Advance directive discussed with you today. Even though you declined this today please call our office should you change your mind and we can give you the proper paperwork for you to fill out.   Conditions/risks identified: overweight  Next appointment:    Preventive Care 17 Years and Older, Female Preventive care refers to lifestyle choices and visits with your health care provider that can promote health and wellness. What does preventive care include?  A yearly physical exam. This is also called an annual well check.  Dental exams once or twice a year.  Routine eye exams. Ask your health care provider how often you should have your eyes checked.  Personal lifestyle choices, including:  Daily care of your teeth and gums.  Regular physical activity.  Eating a healthy diet.  Avoiding tobacco and drug use.  Limiting alcohol use.  Practicing safe sex.  Taking low-dose aspirin every day.  Taking vitamin and mineral supplements as recommended by your health care provider. What happens during an annual well check? The services and screenings done by your health care provider during your annual well check will depend on your age, overall health, lifestyle risk factors, and family history of disease. Counseling  Your health care provider may ask you questions  about your:  Alcohol use.  Tobacco use.  Drug use.  Emotional well-being.  Home and relationship well-being.  Sexual activity.  Eating habits.  History of falls.  Memory and ability to understand (cognition).  Work and work Statistician.  Reproductive health. Screening  You may have the following tests or measurements:  Height, weight, and BMI.  Blood pressure.  Lipid and cholesterol levels. These may be checked every 5 years, or more frequently if you are over 54 years old.  Skin check.  Lung cancer screening. You may have this screening every year starting at age 76 if you have a 30-pack-year history of smoking and currently smoke or have quit within the past 15 years.  Fecal occult blood test (FOBT) of the stool. You may have this test every year starting at age 11.  Flexible sigmoidoscopy or colonoscopy. You may have a sigmoidoscopy every 5 years or a colonoscopy every 10 years starting at age 91.  Hepatitis C blood test.  Hepatitis B blood test.  Sexually transmitted disease (STD) testing.  Diabetes screening. This is done by checking your blood sugar (glucose) after you have not eaten for a while (fasting). You may have this done every 1-3 years.  Bone density scan. This is done to screen for osteoporosis. You may have this done starting at age 62.  Mammogram. This may be done every 1-2 years. Talk to your health care provider about how often you should have regular mammograms. Talk with your health care provider about your test results, treatment options, and if necessary, the need for more tests. Vaccines  Your health care provider may recommend certain vaccines, such as:  Influenza vaccine. This is recommended every year.  Tetanus, diphtheria, and acellular pertussis (Tdap, Td) vaccine. You may need a Td booster every 10 years.  Zoster vaccine. You may need this after age 31.  Pneumococcal 13-valent conjugate (PCV13) vaccine. One dose is  recommended after age 15.  Pneumococcal polysaccharide (PPSV23) vaccine. One dose is recommended after age 79. Talk to your health care provider about which screenings and vaccines you need and how often you need them. This information is not intended to replace advice given to you by your health care provider. Make sure you discuss any questions you have with your health care provider. Document Released: 06/07/2015 Document Revised: 01/29/2016 Document Reviewed: 03/12/2015 Elsevier Interactive Patient Education  2017 [place] Prevention in the Home Falls can cause injuries. They can happen to people of all ages. There are many things you can do to make your home safe and to help prevent falls. What can I do on the outside of my home?  Regularly fix the edges of walkways and driveways and fix any cracks.  Remove anything that might make you trip as you walk through a door, such as a raised step or threshold.  Trim any bushes or trees on the path to your home.  Use bright outdoor lighting.  Clear any walking paths of anything that might make someone trip, such as rocks or tools.  Regularly check to see if handrails are loose or broken. Make sure that both sides of any steps have handrails.  Any raised decks and porches should have guardrails on the edges.  Have any leaves, snow, or ice cleared regularly.  Use sand or salt on walking paths during winter.  Clean up any spills in your garage right away. This includes oil or grease spills. What can I do in the bathroom?  Use night lights.  Install grab bars by the toilet and in the tub and shower. Do not use towel bars as grab bars.  Use non-skid mats or decals in the tub or shower.  If you need to sit down in the shower, use a plastic, non-slip stool.  Keep the floor dry. Clean up any water that spills on the floor as soon as it happens.  Remove soap buildup in the tub or shower regularly.  Attach bath mats  securely with double-sided non-slip rug tape.  Do not have throw rugs and other things on the floor that can make you trip. What can I do in the bedroom?  Use night lights.  Make sure that you have a light by your bed that is easy to reach.  Do not use any sheets or blankets that are too big for your bed. They should not hang down onto the floor.  Have a firm chair that has side arms. You can use this for support while you get dressed.  Do not have throw rugs and other things on the floor that can make you trip. What can I do in the kitchen?  Clean up any spills right away.  Avoid walking on wet floors.  Keep items that you use a lot in easy-to-reach places.  If you need to reach something above you, use a strong step stool that has a grab bar.  Keep electrical cords out of the way.  Do not use floor polish or wax that makes floors slippery. If you must use wax, use non-skid floor wax.  Do not have throw rugs and other things on the floor that can make you trip. What can I do  with my stairs?  Do not leave any items on the stairs.  Make sure that there are handrails on both sides of the stairs and use them. Fix handrails that are broken or loose. Make sure that handrails are as long as the stairways.  Check any carpeting to make sure that it is firmly attached to the stairs. Fix any carpet that is loose or worn.  Avoid having throw rugs at the top or bottom of the stairs. If you do have throw rugs, attach them to the floor with carpet tape.  Make sure that you have a light switch at the top of the stairs and the bottom of the stairs. If you do not have them, ask someone to add them for you. What else can I do to help prevent falls?  Wear shoes that:  Do not have high heels.  Have rubber bottoms.  Are comfortable and fit you well.  Are closed at the toe. Do not wear sandals.  If you use a stepladder:  Make sure that it is fully opened. Do not climb a closed  stepladder.  Make sure that both sides of the stepladder are locked into place.  Ask someone to hold it for you, if possible.  Clearly mark and make sure that you can see:  Any grab bars or handrails.  First and last steps.  Where the edge of each step is.  Use tools that help you move around (mobility aids) if they are needed. These include:  Canes.  Walkers.  Scooters.  Crutches.  Turn on the lights when you go into a dark area. Replace any light bulbs as soon as they burn out.  Set up your furniture so you have a clear path. Avoid moving your furniture around.  If any of your floors are uneven, fix them.  If there are any pets around you, be aware of where they are.  Review your medicines with your doctor. Some medicines can make you feel dizzy. This can increase your chance of falling. Ask your doctor what other things that you can do to help prevent falls. This information is not intended to replace advice given to you by your health care provider. Make sure you discuss any questions you have with your health care provider. Document Released: 03/07/2009 Document Revised: 10/17/2015 Document Reviewed: 06/15/2014 Elsevier Interactive Patient Education  2017 [employer].

## 2019-08-10 NOTE — Progress Notes (Signed)
This visit occurred during the SARS-CoV-2 public health emergency.  Safety protocols were in place, including screening questions prior to the visit, additional usage of staff PPE, and extensive cleaning of exam room while observing appropriate contact time as indicated for disinfecting solutions.  Subjective:   Brooke Brown is a 69 y.o. female who presents for Medicare Annual (Subsequent) preventive examination.  Review of Systems:  n/a Cardiac Risk Factors include: advanced age (>34men, >44 women);diabetes mellitus;hypertension     Objective:     Vitals: BP 124/78 (BP Location: Left Arm, Patient Position: Sitting, Cuff Size: Normal)   Pulse 75   Temp 98.3 F (36.8 C) (Oral)   Ht 5' 4.2" (1.631 m)   Wt 165 lb (74.8 kg)   SpO2 98%   BMI 28.15 kg/m   Body mass index is 28.15 kg/m.  Advanced Directives 08/10/2019 08/03/2018 08/09/2017  Does Patient Have a Medical Advance Directive? No Yes Yes  Type of Advance Directive - Living will Living will  Does patient want to make changes to medical advance directive? - - No - Patient declined  Would patient like information on creating a medical advance directive? No - Patient declined - -    Tobacco Social History   Tobacco Use  Smoking Status Never Smoker  Smokeless Tobacco Never Used     Counseling given: Not Answered   Clinical Intake:  Pre-visit preparation completed: Yes  Pain : No/denies pain     Nutritional Status: BMI 25 -29 Overweight Nutritional Risks: None Diabetes: Yes  How often do you need to have someone help you when you read instructions, pamphlets, or other written materials from your doctor or pharmacy?: 1 - Never What is the last grade level you completed in school?: 2 years college  Interpreter Needed?: No  Information entered by :: NAllen LPN  Past Medical History:  Diagnosis Date  . Anxiety   . Arthritis   . Diabetes mellitus   . Grave's disease   . Grave's disease   .  Hyperlipemia   . Hypertension    Past Surgical History:  Procedure Laterality Date  . ABDOMINAL HYSTERECTOMY    . CATARACT EXTRACTION W/ INTRAOCULAR LENS  IMPLANT, BILATERAL Bilateral   . CHOLECYSTECTOMY N/A 08/16/2017   Procedure: LAPAROSCOPIC CHOLECYSTECTOMY;  Surgeon: Judeth Horn, MD;  Location: [place];  Service: General;  Laterality: N/A;  . TUBAL LIGATION     Family History  Problem Relation Age of Onset  . Pancreatic cancer Mother   . Heart failure Father   . Diabetes Brother   . Breast cancer Cousin    Social History   Socioeconomic History  . Marital status: Married    Spouse name: Not on file  . Number of children: Not on file  . Years of education: Not on file  . Highest education level: Not on file  Occupational History  . Occupation: retired  Tobacco Use  . Smoking status: Never Smoker  . Smokeless tobacco: Never Used  Substance and Sexual Activity  . Alcohol use: Not Currently    Alcohol/week: 0.0 standard drinks    Comment: rarely  . Drug use: No  . Sexual activity: Not Currently  Other Topics Concern  . Not on file  Social History Narrative  . Not on file   Social Determinants of Health   Financial Resource Strain: Low Risk   . Difficulty of Paying Living Expenses: Not hard at all  Food Insecurity: No Food Insecurity  . Worried About Estate manager/land agent  of Food in the Last Year: Never true  . Ran Out of Food in the Last Year: Never true  Transportation Needs: No Transportation Needs  . Lack of Transportation (Medical): No  . Lack of Transportation (Non-Medical): No  Physical Activity: Sufficiently Active  . Days of Exercise per Week: 3 days  . Minutes of Exercise per Session: 60 min  Stress: No Stress Concern Present  . Feeling of Stress : Not at all  Social Connections:   . Frequency of Communication with Friends and Family:   . Frequency of Social Gatherings with Friends and Family:   . Attends Religious Services:   . Active Member of Clubs or  Organizations:   . Attends Archivist Meetings:   Marland Kitchen Marital Status:     Outpatient Encounter Medications as of 08/10/2019  Medication Sig  . acetaminophen (TYLENOL) 500 MG tablet Take 500 mg by mouth daily as needed for moderate pain or headache.  Marland Kitchen amLODipine (NORVASC) 5 MG tablet Take 1 tablet (5 mg total) by mouth daily.  Marland Kitchen aspirin EC 81 MG tablet Take 81 mg by mouth daily.  . calcium carbonate (OSCAL) 1500 (600 Ca) MG TABS tablet Take 600 mg of elemental calcium by mouth daily.  . Carboxymethylcellul-Glycerin (LUBRICATING EYE DROPS OP) Place 1 drop into both eyes 2 (two) times daily.  . Cholecalciferol (VITAMIN D3) 5000 units CAPS Take 5,000 Units by mouth daily.  . hydrochlorothiazide (HYDRODIURIL) 25 MG tablet Take 1 tablet by mouth once daily  . Magnesium 250 MG TABS otc  . metFORMIN (GLUCOPHAGE-XR) 500 MG 24 hr tablet TAKE (1) TABLET BY MOUTH TWICE DAILY.  . Multiple Vitamin (MULTIVITAMIN WITH MINERALS) TABS tablet Take 1 tablet by mouth daily.  Marland Kitchen olmesartan (BENICAR) 40 MG tablet Take 1 tablet by mouth once daily  . ONETOUCH DELICA LANCETS 99991111 MISC USE AS DIRECTED TO CHECK BLOOD SUGARS 1 TIME PER DAY  . ONETOUCH ULTRA test strip Use as instructed to check blood sugars 1 time per day dx: e11.22  . Pitavastatin Calcium (LIVALO) 2 MG TABS Take 1 tablet (2 mg total) by mouth daily.  Marland Kitchen SYNTHROID 112 MCG tablet Take 1 tablet by mouth once daily  . traMADol (ULTRAM) 50 MG tablet Take 1 tablet (50 mg total) by mouth every 6 (six) hours as needed.   No facility-administered encounter medications on file as of 08/10/2019.    Activities of Daily Living In your present state of health, do you have any difficulty performing the following activities: 08/10/2019  Hearing? N  Vision? N  Difficulty concentrating or making decisions? N  Walking or climbing stairs? N  Dressing or bathing? N  Doing errands, shopping? N  Preparing Food and eating ? N  Using the Toilet? N  In the past  six months, have you accidently leaked urine? Y  Comment waited too long  Do you have problems with loss of bowel control? N  Managing your Medications? N  Managing your Finances? N  Housekeeping or managing your Housekeeping? N  Some recent data might be hidden    Patient Care Team: Glendale Chard, MD as PCP - General (Internal Medicine) Rex Kras, Claudette Stapler, RN as [facility]    Assessment:   This is a routine wellness examination for [place].  Exercise Activities and Dietary recommendations Current Exercise Habits: Home exercise routine, Type of exercise: walking, Time (Minutes): 60, Frequency (Times/Week): 3, Weekly Exercise (Minutes/Week): 180  Goals    . Patient Stated (pt-stated)  Wants to be able to get off diabetes medicine    . Patient Stated     08/10/2019, wants to continue walking and lose 5 pounds       Fall Risk Fall Risk  08/10/2019 03/07/2019 12/19/2018 08/03/2018 08/03/2018  Falls in the past year? 0 0 0 0 0  Number falls in past yr: - - - - -  Injury with Fall? - - - - -  Risk for fall due to : Medication side effect - - Medication side effect -  Follow up Falls evaluation completed;Education provided;Falls prevention discussed - - - -   Is the patient's home free of loose throw rugs in walkways, pet beds, electrical cords, etc?   yes      Grab bars in the bathroom? yes      Handrails on the stairs?   yes      Adequate lighting?   yes  Timed Get Up and Go performed: n/a  Depression Screen PHQ 2/9 Scores 08/10/2019 03/07/2019 12/19/2018 08/03/2018  PHQ - 2 Score 0 0 0 0  PHQ- 9 Score 2 - - -     Cognitive Function     6CIT Screen 08/10/2019 08/03/2018  What Year? 0 points 0 points  What month? 0 points 0 points  What time? 0 points 0 points  Count back from 20 0 points 0 points  Months in reverse 0 points 0 points  Repeat phrase 2 points 0 points  Total Score 2 0    Immunization History  Administered Date(s)  Administered  . Influenza, High Dose Seasonal PF 03/10/2018, 01/31/2019  . Pneumococcal Conjugate-13 08/04/2018    Qualifies for Shingles Vaccine? yes  Screening Tests Health Maintenance  Topic Date Due  . OPHTHALMOLOGY EXAM  04/15/2019  . HEMOGLOBIN A1C  09/26/2019  . FOOT EXAM  12/19/2019  . MAMMOGRAM  02/15/2021  . TETANUS/TDAP  01/20/2022  . COLONOSCOPY  11/08/2028  . INFLUENZA VACCINE  Completed  . DEXA SCAN  Completed  . Hepatitis C Screening  Completed  . PNA vac Low Risk Adult  Completed    Cancer Screenings: Lung: Low Dose CT Chest recommended if Age 33-80 years, 30 pack-year currently smoking OR have quit w/in 15years. Patient does not qualify. Breast:  Up to date on Mammogram? Yes   Up to date of Bone Density/Dexa? Yes Colorectal: up to date  Additional Screenings: : Hepatitis C Screening: 10/15/2011     Plan:    Patient wants to continue walking and lose 5 pounds.   I have personally reviewed and noted the following in the patient's chart:   . Medical and social history . Use of alcohol, tobacco or illicit drugs  . Current medications and supplements . Functional ability and status . Nutritional status . Physical activity . Advanced directives . List of other physicians . Hospitalizations, surgeries, and ER visits in previous 12 months . Vitals . Screenings to include cognitive, depression, and falls . Referrals and appointments  In addition, I have reviewed and discussed with patient certain preventive protocols, quality metrics, and best practice recommendations. A written personalized care plan for preventive services as well as general preventive health recommendations were provided to patient.     Kellie Simmering, LPN  [phone]

## 2019-08-11 LAB — CMP14+EGFR
ALT: 14 IU/L (ref 0–32)
AST: 24 IU/L (ref 0–40)
Albumin/Globulin Ratio: 1.4 (ref 1.2–2.2)
Albumin: 4.3 g/dL (ref 3.8–4.8)
Alkaline Phosphatase: 60 IU/L (ref 39–117)
BUN/Creatinine Ratio: 16 (ref 12–28)
BUN: 14 mg/dL (ref 8–27)
Bilirubin Total: 0.2 mg/dL (ref 0.0–1.2)
CO2: 25 mmol/L (ref 20–29)
Calcium: 9.9 mg/dL (ref 8.7–10.3)
Chloride: 103 mmol/L (ref 96–106)
Creatinine, Ser: 0.85 mg/dL (ref 0.57–1.00)
GFR calc Af Amer: 80 mL/min/{1.73_m2} (ref >59)
GFR calc non Af Amer: 70 mL/min/{1.73_m2} (ref >59)
Globulin, Total: 3 g/dL (ref 1.5–4.5)
Glucose: 100 mg/dL — ABNORMAL HIGH (ref 65–99)
Potassium: 4 mmol/L (ref 3.5–5.2)
Sodium: 141 mmol/L (ref 134–144)
Total Protein: 7.3 g/dL (ref 6.0–8.5)

## 2019-08-11 LAB — LIPID PANEL
Chol/HDL Ratio: 2.8 ratio (ref 0.0–4.4)
Cholesterol, Total: 141 mg/dL (ref 100–199)
HDL: 50 mg/dL (ref >39)
LDL Chol Calc (NIH): 66 mg/dL (ref 0–99)
Triglycerides: 145 mg/dL (ref 0–149)
VLDL Cholesterol Cal: 25 mg/dL (ref 5–40)

## 2019-08-11 LAB — HEMOGLOBIN A1C
Est. average glucose Bld gHb Est-mCnc: 140 mg/dL
Hgb A1c MFr Bld: 6.5 % — ABNORMAL HIGH (ref 4.8–5.6)

## 2019-08-11 LAB — TSH: TSH: 1.31 u[IU]/mL (ref 0.450–4.500)

## 2019-08-11 LAB — URINE CULTURE

## 2019-08-13 NOTE — Progress Notes (Signed)
This visit occurred during the SARS-CoV-2 public health emergency.  Safety protocols were in place, including screening questions prior to the visit, additional usage of staff PPE, and extensive cleaning of exam room while observing appropriate contact time as indicated for disinfecting solutions.  Subjective:     Patient ID: Brooke Brown , female    DOB: [birth year] , 69 y.o.   MRN: [id number]   Chief Complaint  Patient presents with  . Diabetes  . Hypertension    HPI  She presents today for DM check. She reports compliance with meds.   Diabetes She presents for her follow-up diabetic visit. She has type 2 diabetes mellitus. Her disease course has been stable. There are no hypoglycemic associated symptoms. Pertinent negatives for diabetes include no blurred vision and no chest pain. There are no hypoglycemic complications. Diabetic complications include nephropathy. Risk factors for coronary artery disease include diabetes mellitus, dyslipidemia, hypertension, post-menopausal and sedentary lifestyle. Her weight is stable. She is following a diabetic diet. She participates in exercise three times a week. Her breakfast blood glucose is taken between 8-9 am. Her breakfast blood glucose range is generally 110-130 mg/dl. Eye exam is current.  Hypertension This is a chronic problem. The current episode started more than 1 year ago. The problem has been gradually improving since onset. The problem is controlled. Pertinent negatives include no blurred vision, chest pain, palpitations or shortness of breath.     Past Medical History:  Diagnosis Date  . Anxiety   . Arthritis   . Diabetes mellitus   . Grave's disease   . Grave's disease   . Hyperlipemia   . Hypertension      Family History  Problem Relation Age of Onset  . Pancreatic cancer Mother   . Heart failure Father   . Diabetes Brother   . Breast cancer Cousin      Current Outpatient Medications:  .  acetaminophen  (TYLENOL) 500 MG tablet, Take 500 mg by mouth daily as needed for moderate pain or headache., Disp: , Rfl:  .  amLODipine (NORVASC) 5 MG tablet, Take 1 tablet (5 mg total) by mouth daily., Disp: 90 tablet, Rfl: 2 .  aspirin EC 81 MG tablet, Take 81 mg by mouth daily., Disp: , Rfl:  .  calcium carbonate (OSCAL) 1500 (600 Ca) MG TABS tablet, Take 600 mg of elemental calcium by mouth daily., Disp: , Rfl:  .  Carboxymethylcellul-Glycerin (LUBRICATING EYE DROPS OP), Place 1 drop into both eyes 2 (two) times daily., Disp: , Rfl:  .  Cholecalciferol (VITAMIN D3) 5000 units CAPS, Take 5,000 Units by mouth daily., Disp: , Rfl:  .  hydrochlorothiazide (HYDRODIURIL) 25 MG tablet, Take 1 tablet by mouth once daily, Disp: 90 tablet, Rfl: 0 .  Magnesium 250 MG TABS, otc, Disp:  , Rfl: 0 .  metFORMIN (GLUCOPHAGE-XR) 500 MG 24 hr tablet, TAKE (1) TABLET BY MOUTH TWICE DAILY., Disp: 180 tablet, Rfl: 0 .  Multiple Vitamin (MULTIVITAMIN WITH MINERALS) TABS tablet, Take 1 tablet by mouth daily., Disp: , Rfl:  .  olmesartan (BENICAR) 40 MG tablet, Take 1 tablet by mouth once daily, Disp: 90 tablet, Rfl: 0 .  ONETOUCH DELICA LANCETS 61W MISC, USE AS DIRECTED TO CHECK BLOOD SUGARS 1 TIME PER DAY, Disp: 100 each, Rfl: 11 .  ONETOUCH ULTRA test strip, Use as instructed to check blood sugars 1 time per day dx: e11.22, Disp: 100 each, Rfl: 3 .  Pitavastatin Calcium (LIVALO) 2 MG TABS, Take 1  tablet (2 mg total) by mouth daily., Disp: 90 tablet, Rfl: 0 .  SYNTHROID 112 MCG tablet, Take 1 tablet by mouth once daily, Disp: 90 tablet, Rfl: 0 .  traMADol (ULTRAM) 50 MG tablet, Take 1 tablet (50 mg total) by mouth every 6 (six) hours as needed., Disp: 30 tablet, Rfl: 0   No Known Allergies   Review of Systems  Constitutional: Negative.   Eyes: Negative for blurred vision.  Respiratory: Negative.  Negative for shortness of breath.   Cardiovascular: Negative.  Negative for chest pain and palpitations.  Gastrointestinal:  Negative.   Neurological: Negative.   Psychiatric/Behavioral: Negative.      Today's Vitals   08/10/19 1021  BP: 124/78  Pulse: 75  Temp: 98.3 F (36.8 C)  TempSrc: Oral  Weight: 165 lb (74.8 kg)  Height: 5' 4.2" (1.631 m)  PainSc: 0-No pain   Body mass index is 28.15 kg/m.   Objective:  Physical Exam Vitals and nursing note reviewed.  Constitutional:      Appearance: Normal appearance.  HENT:     Head: Normocephalic and atraumatic.  Cardiovascular:     Rate and Rhythm: Normal rate and regular rhythm.     Heart sounds: Normal heart sounds.  Pulmonary:     Effort: Pulmonary effort is normal.     Breath sounds: Normal breath sounds.  Skin:    General: Skin is warm.  Neurological:     General: No focal deficit present.     Mental Status: She is alert.  Psychiatric:        Mood and Affect: Mood normal.        Behavior: Behavior normal.         Assessment And Plan:     1. Type 2 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 disease, without long-term current use of insulin (HCC)  Chronic, yet stable. I will check labs as listed below. Importance of regular exercise was discussed with the patient.   - Hemoglobin A1c - CMP14+EGFR  2. Parenchymal renal hypertension, stage 1 through stage 4 or unspecified chronic kidney disease  Chronic, well controlled. She will continue with current meds. She is encouraged to avoid adding salt to her foods.   - Lipid panel  3. Postablative hypothyroidism  I will check thyroid panel and adjust meds as needed.  - TSH  4. Abnormal urine  I will send urine off for culture.   - Culture, Urine  5. Overweight with body mass index (BMI) of 28 to 28.9 in adult  Her BMI is stable for her demographic. She is encouraged to exercise at least 30 minutes five days per week.   [name], MD    THE PATIENT IS ENCOURAGED TO PRACTICE SOCIAL DISTANCING DUE TO THE COVID-19 PANDEMIC.

## 2019-08-21 ENCOUNTER — Other Ambulatory Visit: Payer: Self-pay

## 2019-08-21 ENCOUNTER — Telehealth: Payer: Self-pay

## 2019-08-21 ENCOUNTER — Ambulatory Visit: Payer: Self-pay

## 2019-08-21 DIAGNOSIS — E7800 Pure hypercholesterolemia, unspecified: Secondary | ICD-10-CM

## 2019-08-21 DIAGNOSIS — E1122 Type 2 diabetes mellitus with diabetic chronic kidney disease: Secondary | ICD-10-CM

## 2019-08-21 DIAGNOSIS — I129 Hypertensive chronic kidney disease with stage 1 through stage 4 chronic kidney disease, or unspecified chronic kidney disease: Secondary | ICD-10-CM

## 2019-08-21 DIAGNOSIS — N182 Chronic kidney disease, stage 2 (mild): Secondary | ICD-10-CM

## 2019-08-22 NOTE — Chronic Care Management (AMB) (Signed)
  Chronic Care Management   Outreach Note  08/22/2019 Name: Brooke Brown MRN: [id number] DOB: 08/16/1950  Referred by: Glendale Chard, MD Reason for referral : Chronic Care Management (RQ #2 RN Initial Call-DM/HTN)   A second unsuccessful telephone outreach was attempted today. The patient was referred to the case management team for assistance with care management and care coordination.   Follow Up Plan: A HIPPA compliant phone message was left for the patient providing contact information and requesting a return call.  Telephone follow up appointment with care management team member scheduled for: 09/06/19  Barb Merino, RN, BSN, CCM Care Management Coordinator [facility]  Direct Phone: [phone]

## 2019-09-06 ENCOUNTER — Telehealth: Payer: Self-pay

## 2019-09-23 ENCOUNTER — Other Ambulatory Visit: Payer: Self-pay | Admitting: Internal Medicine

## 2019-09-26 ENCOUNTER — Telehealth: Payer: Self-pay

## 2019-09-26 ENCOUNTER — Other Ambulatory Visit: Payer: Self-pay

## 2019-09-26 ENCOUNTER — Ambulatory Visit: Payer: Self-pay

## 2019-09-26 DIAGNOSIS — I129 Hypertensive chronic kidney disease with stage 1 through stage 4 chronic kidney disease, or unspecified chronic kidney disease: Secondary | ICD-10-CM

## 2019-09-26 DIAGNOSIS — E1122 Type 2 diabetes mellitus with diabetic chronic kidney disease: Secondary | ICD-10-CM

## 2019-09-26 DIAGNOSIS — N182 Chronic kidney disease, stage 2 (mild): Secondary | ICD-10-CM

## 2019-09-27 NOTE — Chronic Care Management (AMB) (Signed)
  Chronic Care Management   Outreach Note  09/27/2019 Name: Brooke Brown MRN: [id number] DOB: 1951/03/28  Referred by: Glendale Chard, MD Reason for referral : Chronic Care Management (RQ #3 Initial RN Call-HTN/DM)   Third unsuccessful telephone outreach was attempted today. The patient was referred to the case management team for assistance with care management and care coordination. The patient's primary care provider has been notified of our unsuccessful attempts to make or maintain contact with the patient. The care management team is pleased to engage with this patient at any time in the future should he/she be interested in assistance from the care management team.   Follow Up Plan: A HIPPA compliant phone message was left for the patient providing contact information and requesting a return call.  The care management team is available to follow up with the patient after provider conversation with the patient regarding recommendation for care management engagement and subsequent re-referral to the care management team.   Barb Merino, RN, BSN, CCM Care Management Coordinator [place] [facility]  Direct Phone: [phone]

## 2019-10-04 ENCOUNTER — Ambulatory Visit: Payer: Self-pay

## 2019-10-04 ENCOUNTER — Other Ambulatory Visit: Payer: Self-pay

## 2019-10-04 ENCOUNTER — Telehealth: Payer: Self-pay

## 2019-10-04 DIAGNOSIS — E7800 Pure hypercholesterolemia, unspecified: Secondary | ICD-10-CM

## 2019-10-04 DIAGNOSIS — N182 Chronic kidney disease, stage 2 (mild): Secondary | ICD-10-CM

## 2019-10-04 DIAGNOSIS — I129 Hypertensive chronic kidney disease with stage 1 through stage 4 chronic kidney disease, or unspecified chronic kidney disease: Secondary | ICD-10-CM

## 2019-10-04 DIAGNOSIS — E1122 Type 2 diabetes mellitus with diabetic chronic kidney disease: Secondary | ICD-10-CM

## 2019-10-05 NOTE — Chronic Care Management (AMB) (Signed)
  Chronic Care Management   Outreach Note  10/05/2019 Name: Brooke Brown MRN: [id number] DOB: [birth year]  Referred by: Glendale Chard, MD Reason for referral : Chronic Care Management (RQ #4 Initial RN Call-HTN/DM)   Fourth unsuccessful telephone outreach was attempted today. The patient was referred to the case management team for assistance with care management and care coordination. The patient's primary care provider has been notified of our unsuccessful attempts to make or maintain contact with the patient. The care management team is pleased to engage with this patient at any time in the future should he/she be interested in assistance from the care management team.   Follow Up Plan: The care management team is available to follow up with the patient after provider conversation with the patient regarding recommendation for care management engagement and subsequent re-referral to the care management team.   Barb Merino, RN, BSN, CCM Care Management Coordinator [place] [facility]  Direct Phone: [phone]

## 2019-10-21 ENCOUNTER — Other Ambulatory Visit: Payer: Self-pay | Admitting: Internal Medicine

## 2019-10-22 MED ORDER — SYNTHROID 112 MCG PO TABS: 112.0000 ug | ORAL_TABLET | Freq: Every day | ORAL | 0 refills | Status: DC

## 2019-10-27 ENCOUNTER — Other Ambulatory Visit: Payer: Self-pay | Admitting: Internal Medicine

## 2019-10-28 MED ORDER — METFORMIN HCL ER 500 MG PO TB24: 500.0000 mg | ORAL_TABLET | Freq: Every day | ORAL | 0 refills | Status: DC

## 2019-11-01 ENCOUNTER — Encounter: Payer: Self-pay | Admitting: Internal Medicine

## 2019-11-02 ENCOUNTER — Other Ambulatory Visit: Payer: Self-pay

## 2019-11-02 ENCOUNTER — Encounter: Payer: Self-pay | Admitting: Internal Medicine

## 2019-11-02 ENCOUNTER — Telehealth: Payer: Self-pay

## 2019-11-02 MED ORDER — METFORMIN HCL ER 500 MG PO TB24: 500.0000 mg | ORAL_TABLET | Freq: Two times a day (BID) | ORAL | 0 refills | Status: DC

## 2019-11-02 NOTE — Telephone Encounter (Signed)
Looked at dispense hx of metformin

## 2019-11-08 ENCOUNTER — Other Ambulatory Visit: Payer: Self-pay

## 2019-11-08 ENCOUNTER — Encounter (INDEPENDENT_AMBULATORY_CARE_PROVIDER_SITE_OTHER): Payer: PPO | Admitting: Ophthalmology

## 2019-11-08 DIAGNOSIS — D3132 Benign neoplasm of left choroid: Secondary | ICD-10-CM | POA: Diagnosis not present

## 2019-11-08 DIAGNOSIS — H33301 Unspecified retinal break, right eye: Secondary | ICD-10-CM

## 2019-11-08 DIAGNOSIS — H43813 Vitreous degeneration, bilateral: Secondary | ICD-10-CM | POA: Diagnosis not present

## 2019-11-08 DIAGNOSIS — I10 Essential (primary) hypertension: Secondary | ICD-10-CM | POA: Diagnosis not present

## 2019-11-08 DIAGNOSIS — H35033 Hypertensive retinopathy, bilateral: Secondary | ICD-10-CM

## 2019-11-17 ENCOUNTER — Other Ambulatory Visit: Payer: Self-pay | Admitting: Internal Medicine

## 2019-11-17 MED ORDER — LIVALO 2 MG PO TABS: 1.0000 | ORAL_TABLET | Freq: Every day | ORAL | 1 refills | Status: DC

## 2019-12-01 ENCOUNTER — Other Ambulatory Visit: Payer: Self-pay | Admitting: Internal Medicine

## 2019-12-24 ENCOUNTER — Other Ambulatory Visit: Payer: Self-pay | Admitting: Internal Medicine

## 2019-12-26 ENCOUNTER — Encounter: Payer: Self-pay | Admitting: Internal Medicine

## 2019-12-26 ENCOUNTER — Other Ambulatory Visit: Payer: Self-pay

## 2019-12-26 ENCOUNTER — Ambulatory Visit (INDEPENDENT_AMBULATORY_CARE_PROVIDER_SITE_OTHER): Payer: PPO | Admitting: Internal Medicine

## 2019-12-26 VITALS — BP 118/76 | HR 88 | Temp 97.7°F | Ht 64.8 in | Wt 163.2 lb

## 2019-12-26 DIAGNOSIS — H6122 Impacted cerumen, left ear: Secondary | ICD-10-CM | POA: Diagnosis not present

## 2019-12-26 DIAGNOSIS — E7800 Pure hypercholesterolemia, unspecified: Secondary | ICD-10-CM

## 2019-12-26 DIAGNOSIS — Z0000 Encounter for general adult medical examination without abnormal findings: Secondary | ICD-10-CM

## 2019-12-26 DIAGNOSIS — I129 Hypertensive chronic kidney disease with stage 1 through stage 4 chronic kidney disease, or unspecified chronic kidney disease: Secondary | ICD-10-CM

## 2019-12-26 DIAGNOSIS — Z6827 Body mass index (BMI) 27.0-27.9, adult: Secondary | ICD-10-CM | POA: Diagnosis not present

## 2019-12-26 DIAGNOSIS — E663 Overweight: Secondary | ICD-10-CM

## 2019-12-26 DIAGNOSIS — E890 Postprocedural hypothyroidism: Secondary | ICD-10-CM | POA: Diagnosis not present

## 2019-12-26 DIAGNOSIS — Z6825 Body mass index (BMI) 25.0-25.9, adult: Secondary | ICD-10-CM | POA: Insufficient documentation

## 2019-12-26 DIAGNOSIS — E559 Vitamin D deficiency, unspecified: Secondary | ICD-10-CM | POA: Diagnosis not present

## 2019-12-26 DIAGNOSIS — E1122 Type 2 diabetes mellitus with diabetic chronic kidney disease: Secondary | ICD-10-CM | POA: Diagnosis not present

## 2019-12-26 DIAGNOSIS — N182 Chronic kidney disease, stage 2 (mild): Secondary | ICD-10-CM

## 2019-12-26 LAB — POCT URINALYSIS DIPSTICK
Bilirubin, UA: NEGATIVE
Blood, UA: NEGATIVE
Glucose, UA: NEGATIVE
Ketones, UA: NEGATIVE
Nitrite, UA: NEGATIVE
Protein, UA: NEGATIVE
Spec Grav, UA: 1.025 (ref 1.010–1.025)
Urobilinogen, UA: 0.2 E.U./dL
pH, UA: 5.5 (ref 5.0–8.0)

## 2019-12-26 LAB — POCT UA - MICROALBUMIN
Albumin/Creatinine Ratio, Urine, POC: <30
Creatinine, POC: 200 mg/dL
Microalbumin Ur, POC: 30 mg/L

## 2019-12-26 MED ORDER — OLMESARTAN MEDOXOMIL 40 MG PO TABS: 40.0000 mg | ORAL_TABLET | Freq: Every day | ORAL | 2 refills | Status: DC

## 2019-12-26 MED ORDER — METFORMIN HCL ER 500 MG PO TB24: 500.0000 mg | ORAL_TABLET | Freq: Two times a day (BID) | ORAL | 2 refills | Status: DC

## 2019-12-26 NOTE — Patient Instructions (Addendum)
Collagen powder - Vital Proteins  Health Maintenance After Age 71 After age 20, you are at a higher risk for certain long-term diseases and infections as well as injuries from falls. Falls are a major cause of broken bones and head injuries in people who are older than age 35. Getting regular preventive care can help to keep you healthy and well. Preventive care includes getting regular testing and making lifestyle changes as recommended by your health care provider. Talk with your health care provider about:  Which screenings and tests you should have. A screening is a test that checks for a disease when you have no symptoms.  A diet and exercise plan that is right for you. What should I know about screenings and tests to prevent falls? Screening and testing are the best ways to find a health problem early. Early diagnosis and treatment give you the best chance of managing medical conditions that are common after age 34. Certain conditions and lifestyle choices may make you more likely to have a fall. Your health care provider may recommend:  Regular vision checks. Poor vision and conditions such as cataracts can make you more likely to have a fall. If you wear glasses, make sure to get your prescription updated if your vision changes.  Medicine review. Work with your health care provider to regularly review all of the medicines you are taking, including over-the-counter medicines. Ask your health care provider about any side effects that may make you more likely to have a fall. Tell your health care provider if any medicines that you take make you feel dizzy or sleepy.  Osteoporosis screening. Osteoporosis is a condition that causes the bones to get weaker. This can make the bones weak and cause them to break more easily.  Blood pressure screening. Blood pressure changes and medicines to control blood pressure can make you feel dizzy.  Strength and balance checks. Your health care provider may  recommend certain tests to check your strength and balance while standing, walking, or changing positions.  Foot health exam. Foot pain and numbness, as well as not wearing proper footwear, can make you more likely to have a fall.  Depression screening. You may be more likely to have a fall if you have a fear of falling, feel emotionally low, or feel unable to do activities that you used to do.  Alcohol use screening. Using too much alcohol can affect your balance and may make you more likely to have a fall. What actions can I take to lower my risk of falls? General instructions  Talk with your health care provider about your risks for falling. Tell your health care provider if: ? You fall. Be sure to tell your health care provider about all falls, even ones that seem minor. ? You feel dizzy, sleepy, or off-balance.  Take over-the-counter and prescription medicines only as told by your health care provider. These include any supplements.  Eat a healthy diet and maintain a healthy weight. A healthy diet includes low-fat dairy products, low-fat (lean) meats, and fiber from whole grains, beans, and lots of fruits and vegetables. Home safety  Remove any tripping hazards, such as rugs, cords, and clutter.  Install safety equipment such as grab bars in bathrooms and safety rails on stairs.  Keep rooms and walkways well-lit. Activity   Follow a regular exercise program to stay fit. This will help you maintain your balance. Ask your health care provider what types of exercise are appropriate for you.  If you need a cane or walker, use it as recommended by your health care provider.  Wear supportive shoes that have nonskid soles. Lifestyle  Do not drink alcohol if your health care provider tells you not to drink.  If you drink alcohol, limit how much you have: ? 0-1 drink a day for women. ? 0-2 drinks a day for men.  Be aware of how much alcohol is in your drink. In the [place] one drink  equals one typical bottle of beer (12 oz), one-half glass of wine (5 oz), or one shot of hard liquor (1 oz).  Do not use any products that contain nicotine or tobacco, such as cigarettes and e-cigarettes. If you need help quitting, ask your health care provider. Summary  Having a healthy lifestyle and getting preventive care can help to protect your health and wellness after age 37.  Screening and testing are the best way to find a health problem early and help you avoid having a fall. Early diagnosis and treatment give you the best chance for managing medical conditions that are more common for people who are older than age 87.  Falls are a major cause of broken bones and head injuries in people who are older than age 71. Take precautions to prevent a fall at home.  Work with your health care provider to learn what changes you can make to improve your health and wellness and to prevent falls. This information is not intended to replace advice given to you by your health care provider. Make sure you discuss any questions you have with your health care provider. Document Revised: 09/01/2018 Document Reviewed: 03/24/2017 Elsevier Patient Education  2020 [employer].

## 2019-12-26 NOTE — Progress Notes (Signed)
I,[name],acting as a Education administrator for [name], MD.,have documented all relevant documentation on the behalf of [name], MD,as directed by  [name], MD while in the presence of [name], MD.  This visit occurred during the SARS-CoV-2 public health emergency.  Safety protocols were in place, including screening questions prior to the visit, additional usage of staff PPE, and extensive cleaning of exam room while observing appropriate contact time as indicated for disinfecting solutions.  Subjective:     Patient ID: Brooke Brown , female    DOB: May 20, 1951 , 69 y.o.   MRN: [id number]   Chief Complaint  Patient presents with  . Annual Exam  . Diabetes  . Hypertension    HPI  The patient is here today for a physical examination.  She was formerly followed by Dr. [name] for her GYN exams, but he recently retired. She is s/p hysterectomy.  Diabetes She presents for her follow-up diabetic visit. She has type 2 diabetes mellitus. Her disease course has been stable. There are no hypoglycemic associated symptoms. Pertinent negatives for diabetes include no blurred vision and no chest pain. There are no hypoglycemic complications. Diabetic complications include nephropathy. Risk factors for coronary artery disease include diabetes mellitus, dyslipidemia, hypertension, post-menopausal and sedentary lifestyle. Her weight is stable. She is following a diabetic diet. She participates in exercise three times a week. Her breakfast blood glucose is taken between 8-9 am. Her breakfast blood glucose range is generally 110-130 mg/dl. Eye exam is current.  Hypertension This is a chronic problem. The current episode started more than 1 year ago. The problem has been gradually improving since onset. The problem is controlled. Pertinent negatives include no blurred vision, chest pain, palpitations or shortness of breath. Past treatments include calcium channel blockers,  diuretics and angiotensin blockers. The current treatment provides moderate improvement. Hypertensive end-organ damage includes kidney disease.     Past Medical History:  Diagnosis Date  . Anxiety   . Arthritis   . Diabetes mellitus   . Grave's disease   . Grave's disease   . Hyperlipemia   . Hypertension      Family History  Problem Relation Age of Onset  . Pancreatic cancer Mother   . Heart failure Father   . Diabetes Brother   . Breast cancer Cousin      Current Outpatient Medications:  .  acetaminophen (TYLENOL) 500 MG tablet, Take 500 mg by mouth daily as needed for moderate pain or headache., Disp: , Rfl:  .  amLODipine (NORVASC) 5 MG tablet, Take 1 tablet by mouth once daily, Disp: 90 tablet, Rfl: 1 .  aspirin EC 81 MG tablet, Take 81 mg by mouth daily., Disp: , Rfl:  .  calcium carbonate (OSCAL) 1500 (600 Ca) MG TABS tablet, Take 600 mg of elemental calcium by mouth daily., Disp: , Rfl:  .  Carboxymethylcellul-Glycerin (LUBRICATING EYE DROPS OP), Place 1 drop into both eyes 2 (two) times daily., Disp: , Rfl:  .  hydrochlorothiazide (HYDRODIURIL) 25 MG tablet, Take 1 tablet by mouth once daily, Disp: 90 tablet, Rfl: 1 .  Magnesium 250 MG TABS, otc, Disp:  , Rfl: 0 .  metFORMIN (GLUCOPHAGE-XR) 500 MG 24 hr tablet, Take 1 tablet (500 mg total) by mouth 2 (two) times daily., Disp: 180 tablet, Rfl: 2 .  Multiple Vitamin (MULTIVITAMIN WITH MINERALS) TABS tablet, Take 1 tablet by mouth daily., Disp: , Rfl:  .  olmesartan (BENICAR) 40 MG tablet, Take 1  tablet (40 mg total) by mouth daily., Disp: 90 tablet, Rfl: 2 .  OneTouch Delica Lancets 17O MISC, USE AS DIRECTED TO CHECK BLOOD SUGARS ONCE DAILY, Disp: 100 each, Rfl: 2 .  ONETOUCH ULTRA test strip, USE AS INSTRUCTED TO CHECK BLOOD SUGARS ONE TIME PER DAY, Disp: 100 each, Rfl: 2 .  Pitavastatin Calcium (LIVALO) 2 MG TABS, Take 1 tablet (2 mg total) by mouth daily., Disp: 90 tablet, Rfl: 1 .  SYNTHROID 112 MCG tablet, Take 1  tablet (112 mcg total) by mouth daily., Disp: 90 tablet, Rfl: 0 .  Cholecalciferol (VITAMIN D3) 125 MCG (5000 UT) CAPS, Take 1 capsule (5,000 Units total) by mouth daily. Do not take on saturday and Sunday., Disp: 30 capsule, Rfl: 0   No Known Allergies    The patient states she uses post menopausal status for birth control. Last LMP was No LMP recorded. Patient has had a hysterectomy.. Negative for Dysmenorrhea. Negative for: breast discharge, breast lump(s), breast pain and breast self exam. Associated symptoms include abnormal vaginal bleeding. Pertinent negatives include abnormal bleeding (hematology), anxiety, decreased libido, depression, difficulty falling sleep, dyspareunia, history of infertility, nocturia, sexual dysfunction, sleep disturbances, urinary incontinence, urinary urgency, vaginal discharge and vaginal itching. Diet regular.The patient states her exercise level is    . The patient's tobacco use is:  Social History   Tobacco Use  Smoking Status Never Smoker  Smokeless Tobacco Never Used  . She has been exposed to passive smoke. The patient's alcohol use is:  Social History   Substance and Sexual Activity  Alcohol Use Not Currently  . Alcohol/week: 0.0 standard drinks   Comment: rarely    Review of Systems  Constitutional: Negative.   HENT: Negative.   Eyes: Negative.  Negative for blurred vision.  Respiratory: Negative.  Negative for shortness of breath.   Cardiovascular: Negative.  Negative for chest pain and palpitations.  Gastrointestinal: Negative.   Endocrine: Negative.   Genitourinary: Negative.   Musculoskeletal: Negative.   Skin: Negative.   Allergic/Immunologic: Negative.   Neurological: Negative.   Hematological: Negative.   Psychiatric/Behavioral: Negative.      Today's Vitals   12/26/19 1029  BP: 118/76  Pulse: 88  Temp: 97.7 F (36.5 C)  TempSrc: Oral  Weight: 163 lb 3.2 oz (74 kg)  Height: 5' 4.8" (1.646 m)  PainSc: 4   PainLoc: Arm    Body mass index is 27.33 kg/m.  Wt Readings from Last 3 Encounters:  12/26/19 163 lb 3.2 oz (74 kg)  08/10/19 165 lb (74.8 kg)  08/10/19 165 lb (74.8 kg)   Objective:  Physical Exam Constitutional:      General: She is not in acute distress.    Appearance: Normal appearance. She is well-developed. She is obese.  HENT:     Head: Normocephalic and atraumatic.     Right Ear: Hearing, tympanic membrane, ear canal and external ear normal. There is no impacted cerumen.     Left Ear: Hearing, ear canal and external ear normal. There is impacted cerumen.     Nose:     Comments: Deferred, masked    Mouth/Throat:     Comments: Deferred, masked Eyes:     General: Lids are normal.     Extraocular Movements: Extraocular movements intact.     Conjunctiva/sclera: Conjunctivae normal.     Pupils: Pupils are equal, round, and reactive to light.     Funduscopic exam:    Right eye: No papilledema.  Left eye: No papilledema.  Neck:     Thyroid: No thyroid mass.     Vascular: No carotid bruit.  Cardiovascular:     Rate and Rhythm: Normal rate and regular rhythm.     Pulses: Normal pulses.          Dorsalis pedis pulses are 2+ on the right side and 2+ on the left side.     Heart sounds: Normal heart sounds. No murmur heard.   Pulmonary:     Effort: Pulmonary effort is normal.     Breath sounds: Normal breath sounds.  Chest:     Breasts: Tanner Score is 5.        Right: Normal.        Left: Normal.  Abdominal:     General: Abdomen is flat. Bowel sounds are normal. There is no distension.     Palpations: Abdomen is soft.     Tenderness: There is no abdominal tenderness.  Musculoskeletal:        General: No swelling. Normal range of motion.     Cervical back: Full passive range of motion without pain, normal range of motion and neck supple.     Right lower leg: No edema.     Left lower leg: No edema.  Feet:     Right foot:     Protective Sensation: 5 sites tested. 5 sites  sensed.     Skin integrity: Skin integrity normal.     Toenail Condition: Right toenails are normal.     Left foot:     Protective Sensation: 5 sites tested. 5 sites sensed.     Skin integrity: Skin integrity normal.     Toenail Condition: Left toenails are normal.  Skin:    General: Skin is warm and dry.     Capillary Refill: Capillary refill takes less than 2 seconds.  Neurological:     General: No focal deficit present.     Mental Status: She is alert and oriented to person, place, and time.     Cranial Nerves: No cranial nerve deficit.     Sensory: No sensory deficit.  Psychiatric:        Mood and Affect: Mood normal.        Behavior: Behavior normal.        Thought Content: Thought content normal.        Judgment: Judgment normal.         Assessment And Plan:     1. Routine general medical examination at a health care facility Comments: A full exam was performed. Importance of monthly self breast exams was discussed with the patient. PATIENT IS ADVISED TO GET 30-45 MINUTES REGULAR EXERCISE NO LESS THAN FOUR TO FIVE DAYS PER WEEK - BOTH WEIGHTBEARING EXERCISES AND AEROBIC ARE RECOMMENDED.  PATIENT IS ADVISED TO FOLLOW A HEALTHY DIET WITH AT LEAST SIX FRUITS/VEGGIES PER DAY, DECREASE INTAKE OF RED MEAT, AND TO INCREASE FISH INTAKE TO TWO DAYS PER WEEK.  MEATS/FISH SHOULD NOT BE FRIED, BAKED OR BROILED IS PREFERABLE.  I SUGGEST WEARING SPF 50 SUNSCREEN ON EXPOSED PARTS AND ESPECIALLY WHEN IN THE DIRECT SUNLIGHT FOR AN EXTENDED PERIOD OF TIME.  PLEASE AVOID FAST FOOD RESTAURANTS AND INCREASE YOUR WATER INTAKE.  2. Type 2 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 disease, without long-term current use of insulin (HCC) Comments: Diabetic foot exam was performed. I DISCUSSED WITH THE PATIENT AT LENGTH REGARDING THE GOALS OF GLYCEMIC CONTROL AND POSSIBLE LONG-TERM COMPLICATIONS.  I  ALSO STRESSED THE IMPORTANCE OF COMPLIANCE WITH HOME GLUCOSE MONITORING, DIETARY RESTRICTIONS INCLUDING  AVOIDANCE OF SUGARY DRINKS/PROCESSED FOODS,  ALONG WITH REGULAR EXERCISE.  I  ALSO STRESSED THE IMPORTANCE OF ANNUAL EYE EXAMS, SELF FOOT CARE AND COMPLIANCE WITH OFFICE VISITS.  - Hemoglobin A1c - CBC - CMP14+EGFR - POCT Urinalysis Dipstick (81002) - POCT UA - Microalbumin  3. Benign hypertensive renal disease Comments: Chronic, well controlled. She will continue with current meds. She is encouraged to avoid adding salt to her foods. EKG performed, NSR w/o acute changes. She will rto in six months for re-evaluation.  - EKG 12-Lead  4. Postablative hypothyroidism Comments: I will check thyroid panel and adjust meds as needed.  - TSH - T4, Free  5. Overweight with body mass index (BMI) of 27 to 27.9 in adult  Her BMi is acceptable for her demographic. She is encouraged to exercise 30 minutes five days per week.   6. Vitamin D deficiency disease  I WILL CHECK A VIT D LEVEL AND SUPPLEMENT AS NEEDED.  ALSO ENCOURAGED TO SPEND 15 MINUTES IN THE SUN DAILY.  - VITAMIN D 25 Hydroxy (Vit-D Deficiency, Fractures)  7. Left ear impacted cerumen  AFTER OBTAINING VERBAL CONSENT, LEFT EAR WAS FLUSHED BY IRRIGATION. SHE TOLERATED PROCEDURE WELL WITHOUT ANY COMPLICATIONS. NO TM ABNORMALITIES WERE NOTED.  - Ear Lavage    Patient was given opportunity to ask questions. Patient verbalized understanding of the plan and was able to repeat key elements of the plan. All questions were answered to their satisfaction.   [name], MD   I, [name], MD, have reviewed all documentation for this visit. The documentation on 01/05/20 for the exam, diagnosis, procedures, and orders are all accurate and complete.  THE PATIENT IS ENCOURAGED TO PRACTICE SOCIAL DISTANCING DUE TO THE COVID-19 PANDEMIC.

## 2019-12-27 LAB — CMP14+EGFR
ALT: 17 IU/L (ref 0–32)
AST: 26 IU/L (ref 0–40)
Albumin/Globulin Ratio: 1.5 (ref 1.2–2.2)
Albumin: 4.5 g/dL (ref 3.8–4.8)
Alkaline Phosphatase: 55 IU/L (ref 48–121)
BUN/Creatinine Ratio: 18 (ref 12–28)
BUN: 16 mg/dL (ref 8–27)
Bilirubin Total: 0.2 mg/dL (ref 0.0–1.2)
CO2: 26 mmol/L (ref 20–29)
Calcium: 10.3 mg/dL (ref 8.7–10.3)
Chloride: 100 mmol/L (ref 96–106)
Creatinine, Ser: 0.87 mg/dL (ref 0.57–1.00)
GFR calc Af Amer: 78 mL/min/{1.73_m2} (ref >59)
GFR calc non Af Amer: 68 mL/min/{1.73_m2} (ref >59)
Globulin, Total: 3.1 g/dL (ref 1.5–4.5)
Glucose: 101 mg/dL — ABNORMAL HIGH (ref 65–99)
Potassium: 3.7 mmol/L (ref 3.5–5.2)
Sodium: 140 mmol/L (ref 134–144)
Total Protein: 7.6 g/dL (ref 6.0–8.5)

## 2019-12-27 LAB — CBC
Hematocrit: 36.9 % (ref 34.0–46.6)
Hemoglobin: 12.6 g/dL (ref 11.1–15.9)
MCH: 28.1 pg (ref 26.6–33.0)
MCHC: 34.1 g/dL (ref 31.5–35.7)
MCV: 82 fL (ref 79–97)
Platelets: 360 10*3/uL (ref 150–450)
RBC: 4.48 x10E6/uL (ref 3.77–5.28)
RDW: 12.9 % (ref 11.7–15.4)
WBC: 5.4 10*3/uL (ref 3.4–10.8)

## 2019-12-27 LAB — TSH: TSH: 0.66 u[IU]/mL (ref 0.450–4.500)

## 2019-12-27 LAB — T4, FREE: Free T4: 1.74 ng/dL (ref 0.82–1.77)

## 2019-12-27 LAB — VITAMIN D 25 HYDROXY (VIT D DEFICIENCY, FRACTURES): Vit D, 25-Hydroxy: 112 ng/mL — ABNORMAL HIGH (ref 30.0–100.0)

## 2019-12-27 LAB — HEMOGLOBIN A1C
Est. average glucose Bld gHb Est-mCnc: 146 mg/dL
Hgb A1c MFr Bld: 6.7 % — ABNORMAL HIGH (ref 4.8–5.6)

## 2019-12-30 ENCOUNTER — Encounter: Payer: Self-pay | Admitting: Internal Medicine

## 2020-01-01 ENCOUNTER — Other Ambulatory Visit: Payer: Self-pay

## 2020-01-01 MED ORDER — VITAMIN D3 125 MCG (5000 UT) PO CAPS: 5000.0000 [IU] | ORAL_CAPSULE | Freq: Every day | ORAL | 0 refills | Status: DC

## 2020-01-05 NOTE — Progress Notes (Signed)
I,[name],acting as a Education administrator for [name], MD.,have documented all relevant documentation on the behalf of [name], MD,as directed by  [name], MD while in the presence of [name], MD.  This visit occurred during the SARS-CoV-2 public health emergency.  Safety protocols were in place, including screening questions prior to the visit, additional usage of staff PPE, and extensive cleaning of exam room while observing appropriate contact time as indicated for disinfecting solutions.  Subjective:     Patient ID: Brooke Brown , female    DOB: December 27, 1950 , 69 y.o.   MRN: [id number]   Chief Complaint  Patient presents with  . Annual Exam  . Diabetes  . Hypertension    HPI  The patient is here today for a physical examination.  She was formerly followed by Dr. [name] for her GYN exams, but he recently retired. She is s/p hysterectomy.   Diabetes She presents for her follow-up diabetic visit. She has type 2 diabetes mellitus. Her disease course has been stable. There are no hypoglycemic associated symptoms. Pertinent negatives for diabetes include no blurred vision and no chest pain. There are no hypoglycemic complications. Diabetic complications include nephropathy. Risk factors for coronary artery disease include diabetes mellitus, dyslipidemia, hypertension, post-menopausal and sedentary lifestyle. Her weight is stable. She is following a diabetic diet. She participates in exercise three times a week. Her breakfast blood glucose is taken between 8-9 am. Her breakfast blood glucose range is generally 110-130 mg/dl. Eye exam is current.  Hypertension This is a chronic problem. The current episode started more than 1 year ago. The problem has been gradually improving since onset. The problem is controlled. Pertinent negatives include no blurred vision, chest pain, palpitations or shortness of breath. Past treatments include calcium channel blockers,  diuretics and angiotensin blockers. The current treatment provides moderate improvement. Hypertensive end-organ damage includes kidney disease.     Past Medical History:  Diagnosis Date  . Anxiety   . Arthritis   . Diabetes mellitus   . Grave's disease   . Grave's disease   . Hyperlipemia   . Hypertension      Family History  Problem Relation Age of Onset  . Pancreatic cancer Mother   . Heart failure Father   . Diabetes Brother   . Breast cancer Cousin      Current Outpatient Medications:  .  acetaminophen (TYLENOL) 500 MG tablet, Take 500 mg by mouth daily as needed for moderate pain or headache., Disp: , Rfl:  .  amLODipine (NORVASC) 5 MG tablet, Take 1 tablet by mouth once daily, Disp: 90 tablet, Rfl: 1 .  aspirin EC 81 MG tablet, Take 81 mg by mouth daily., Disp: , Rfl:  .  calcium carbonate (OSCAL) 1500 (600 Ca) MG TABS tablet, Take 600 mg of elemental calcium by mouth daily., Disp: , Rfl:  .  Carboxymethylcellul-Glycerin (LUBRICATING EYE DROPS OP), Place 1 drop into both eyes 2 (two) times daily., Disp: , Rfl:  .  hydrochlorothiazide (HYDRODIURIL) 25 MG tablet, Take 1 tablet by mouth once daily, Disp: 90 tablet, Rfl: 1 .  Magnesium 250 MG TABS, otc, Disp:  , Rfl: 0 .  metFORMIN (GLUCOPHAGE-XR) 500 MG 24 hr tablet, Take 1 tablet (500 mg total) by mouth 2 (two) times daily., Disp: 180 tablet, Rfl: 2 .  Multiple Vitamin (MULTIVITAMIN WITH MINERALS) TABS tablet, Take 1 tablet by mouth daily., Disp: , Rfl:  .  olmesartan (BENICAR) 40 MG tablet, Take  1 tablet (40 mg total) by mouth daily., Disp: 90 tablet, Rfl: 2 .  OneTouch Delica Lancets 11A MISC, USE AS DIRECTED TO CHECK BLOOD SUGARS ONCE DAILY, Disp: 100 each, Rfl: 2 .  ONETOUCH ULTRA test strip, USE AS INSTRUCTED TO CHECK BLOOD SUGARS ONE TIME PER DAY, Disp: 100 each, Rfl: 2 .  Pitavastatin Calcium (LIVALO) 2 MG TABS, Take 1 tablet (2 mg total) by mouth daily., Disp: 90 tablet, Rfl: 1 .  SYNTHROID 112 MCG tablet, Take 1  tablet (112 mcg total) by mouth daily., Disp: 90 tablet, Rfl: 0 .  Cholecalciferol (VITAMIN D3) 125 MCG (5000 UT) CAPS, Take 1 capsule (5,000 Units total) by mouth daily. Do not take on saturday and Sunday., Disp: 30 capsule, Rfl: 0   No Known Allergies    The patient states she uses post menopausal status for birth control. Last LMP was No LMP recorded. Patient has had a hysterectomy.. Negative for Dysmenorrhea. Negative for: breast discharge, breast lump(s), breast pain and breast self exam. Associated symptoms include abnormal vaginal bleeding. Pertinent negatives include abnormal bleeding (hematology), anxiety, decreased libido, depression, difficulty falling sleep, dyspareunia, history of infertility, nocturia, sexual dysfunction, sleep disturbances, urinary incontinence, urinary urgency, vaginal discharge and vaginal itching. Diet regular.The patient states her exercise level is    . The patient's tobacco use is:  Social History   Tobacco Use  Smoking Status Never Smoker  Smokeless Tobacco Never Used  . She has been exposed to passive smoke. The patient's alcohol use is:  Social History   Substance and Sexual Activity  Alcohol Use Not Currently  . Alcohol/week: 0.0 standard drinks   Comment: rarely    Review of Systems  Constitutional: Negative.   HENT: Negative.   Eyes: Negative.  Negative for blurred vision.  Respiratory: Negative.  Negative for shortness of breath.   Cardiovascular: Negative.  Negative for chest pain and palpitations.  Gastrointestinal: Negative.   Endocrine: Negative.   Genitourinary: Negative.   Musculoskeletal: Negative.   Skin: Negative.   Allergic/Immunologic: Negative.   Neurological: Negative.   Hematological: Negative.   Psychiatric/Behavioral: Negative.      Today's Vitals   12/26/19 1029  BP: 118/76  Pulse: 88  Temp: 97.7 F (36.5 C)  TempSrc: Oral  Weight: 163 lb 3.2 oz (74 kg)  Height: 5' 4.8" (1.646 m)  PainSc: 4   PainLoc: Arm    Body mass index is 27.33 kg/m.  Wt Readings from Last 3 Encounters:  12/26/19 163 lb 3.2 oz (74 kg)  08/10/19 165 lb (74.8 kg)  08/10/19 165 lb (74.8 kg)   Objective:  Physical Exam Constitutional:      General: She is not in acute distress.    Appearance: Normal appearance. She is well-developed. She is obese.  HENT:     Head: Normocephalic and atraumatic.     Right Ear: Hearing, tympanic membrane, ear canal and external ear normal. There is no impacted cerumen.     Left Ear: Hearing, ear canal and external ear normal. There is impacted cerumen.     Nose:     Comments: Deferred, masked    Mouth/Throat:     Comments: Deferred, masked Eyes:     General: Lids are normal.     Extraocular Movements: Extraocular movements intact.     Conjunctiva/sclera: Conjunctivae normal.     Pupils: Pupils are equal, round, and reactive to light.     Funduscopic exam:    Right eye: No papilledema.  Left eye: No papilledema.  Neck:     Thyroid: No thyroid mass.     Vascular: No carotid bruit.  Cardiovascular:     Rate and Rhythm: Normal rate and regular rhythm.     Pulses: Normal pulses.          Dorsalis pedis pulses are 2+ on the right side and 2+ on the left side.     Heart sounds: Normal heart sounds. No murmur heard.   Pulmonary:     Effort: Pulmonary effort is normal.     Breath sounds: Normal breath sounds.  Chest:     Breasts: Tanner Score is 5.        Right: Normal.        Left: Normal.  Abdominal:     General: Abdomen is flat. Bowel sounds are normal. There is no distension.     Palpations: Abdomen is soft.     Tenderness: There is no abdominal tenderness.  Musculoskeletal:        General: No swelling. Normal range of motion.     Cervical back: Full passive range of motion without pain, normal range of motion and neck supple.     Right lower leg: No edema.     Left lower leg: No edema.  Feet:     Right foot:     Protective Sensation: 5 sites tested. 5 sites  sensed.     Skin integrity: Skin integrity normal.     Toenail Condition: Right toenails are normal.     Left foot:     Protective Sensation: 5 sites tested. 5 sites sensed.     Skin integrity: Skin integrity normal.     Toenail Condition: Left toenails are normal.  Skin:    General: Skin is warm and dry.     Capillary Refill: Capillary refill takes less than 2 seconds.  Neurological:     General: No focal deficit present.     Mental Status: She is alert and oriented to person, place, and time.     Cranial Nerves: No cranial nerve deficit.     Sensory: No sensory deficit.  Psychiatric:        Mood and Affect: Mood normal.        Behavior: Behavior normal.        Thought Content: Thought content normal.        Judgment: Judgment normal.         Assessment And Plan:     1. Routine general medical examination at a health care facility Comments: A full exam was performed. Importance of monthly self breast exams was discussed with the patient. PATIENT IS ADVISED TO GET 30-45 MINUTES REGULAR EXERCISE NO LESS THAN FOUR TO FIVE DAYS PER WEEK - BOTH WEIGHTBEARING EXERCISES AND AEROBIC ARE RECOMMENDED.  PATIENT IS ADVISED TO FOLLOW A HEALTHY DIET WITH AT LEAST SIX FRUITS/VEGGIES PER DAY, DECREASE INTAKE OF RED MEAT, AND TO INCREASE FISH INTAKE TO TWO DAYS PER WEEK.  MEATS/FISH SHOULD NOT BE FRIED, BAKED OR BROILED IS PREFERABLE.  I SUGGEST WEARING SPF 50 SUNSCREEN ON EXPOSED PARTS AND ESPECIALLY WHEN IN THE DIRECT SUNLIGHT FOR AN EXTENDED PERIOD OF TIME.  PLEASE AVOID FAST FOOD RESTAURANTS AND INCREASE YOUR WATER INTAKE.  2. Type 2 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 disease, without long-term current use of insulin (HCC) Comments: Diabetic foot exam was performed. I DISCUSSED WITH THE PATIENT AT LENGTH REGARDING THE GOALS OF GLYCEMIC CONTROL AND POSSIBLE LONG-TERM COMPLICATIONS.  I  ALSO STRESSED THE IMPORTANCE OF COMPLIANCE WITH HOME GLUCOSE MONITORING, DIETARY RESTRICTIONS INCLUDING  AVOIDANCE OF SUGARY DRINKS/PROCESSED FOODS,  ALONG WITH REGULAR EXERCISE.  I  ALSO STRESSED THE IMPORTANCE OF ANNUAL EYE EXAMS, SELF FOOT CARE AND COMPLIANCE WITH OFFICE VISITS.  - Hemoglobin A1c - CBC - CMP14+EGFR - POCT Urinalysis Dipstick (81002) - POCT UA - Microalbumin  3. Benign hypertensive renal disease Comments: Chronic, well controlled. She will continue with current meds. She is encouraged to avoid adding salt to her foods. EKG performed, NSR w/o acute changes.  - EKG 12-Lead  4. Postablative hypothyroidism Comments: I will check thyroid panel and adjust meds as needed.  - TSH - T4, Free  5. Overweight with body mass index (BMI) of 27 to 27.9 in adult  Her BMi is acceptable for her demographic. She is encouraged to exercise 30 minutes five days per week.   6. Vitamin D deficiency disease  I WILL CHECK A VIT D LEVEL AND SUPPLEMENT AS NEEDED.  ALSO ENCOURAGED TO SPEND 15 MINUTES IN THE SUN DAILY.  - VITAMIN D 25 Hydroxy (Vit-D Deficiency, Fractures)  7. Left ear impacted cerumen  AFTER OBTAINING VERBAL CONSENT, LEFT EAR WAS FLUSHED BY IRRIGATION. SHE TOLERATED PROCEDURE WELL WITHOUT ANY COMPLICATIONS. NO TM ABNORMALITIES WERE NOTED.  - Ear Lavage    Patient was given opportunity to ask questions. Patient verbalized understanding of the plan and was able to repeat key elements of the plan. All questions were answered to their satisfaction.   [name], MD   I, [name], MD, have reviewed all documentation for this visit. The documentation on 01/05/20 for the exam, diagnosis, procedures, and orders are all accurate and complete.  THE PATIENT IS ENCOURAGED TO PRACTICE SOCIAL DISTANCING DUE TO THE COVID-19 PANDEMIC.

## 2020-01-16 ENCOUNTER — Other Ambulatory Visit: Payer: Self-pay | Admitting: Internal Medicine

## 2020-01-16 ENCOUNTER — Encounter: Payer: Self-pay | Admitting: Internal Medicine

## 2020-01-30 DIAGNOSIS — M1812 Unilateral primary osteoarthritis of first carpometacarpal joint, left hand: Secondary | ICD-10-CM | POA: Diagnosis not present

## 2020-01-30 DIAGNOSIS — M25512 Pain in left shoulder: Secondary | ICD-10-CM | POA: Diagnosis not present

## 2020-01-30 DIAGNOSIS — M47816 Spondylosis without myelopathy or radiculopathy, lumbar region: Secondary | ICD-10-CM | POA: Diagnosis not present

## 2020-02-07 ENCOUNTER — Other Ambulatory Visit: Payer: Self-pay | Admitting: Internal Medicine

## 2020-02-07 DIAGNOSIS — Z1231 Encounter for screening mammogram for malignant neoplasm of breast: Secondary | ICD-10-CM

## 2020-02-13 ENCOUNTER — Ambulatory Visit (INDEPENDENT_AMBULATORY_CARE_PROVIDER_SITE_OTHER): Payer: PPO

## 2020-02-13 ENCOUNTER — Other Ambulatory Visit: Payer: Self-pay

## 2020-02-13 VITALS — BP 122/74 | HR 87 | Temp 98.0°F | Ht 64.8 in | Wt 163.0 lb

## 2020-02-13 DIAGNOSIS — Z23 Encounter for immunization: Secondary | ICD-10-CM | POA: Diagnosis not present

## 2020-02-13 NOTE — Progress Notes (Signed)
Pt presents today for a flu shot

## 2020-02-20 ENCOUNTER — Ambulatory Visit
Admission: RE | Admit: 2020-02-20 | Discharge: 2020-02-20 | Disposition: A | Discharge status: Home / Self Care | Payer: PPO | Source: Ambulatory Visit | Attending: Internal Medicine | Admitting: Internal Medicine

## 2020-02-20 ENCOUNTER — Other Ambulatory Visit: Payer: Self-pay

## 2020-02-20 DIAGNOSIS — Z1231 Encounter for screening mammogram for malignant neoplasm of breast: Secondary | ICD-10-CM | POA: Diagnosis not present

## 2020-02-26 ENCOUNTER — Encounter: Payer: Self-pay | Admitting: Internal Medicine

## 2020-02-27 ENCOUNTER — Ambulatory Visit: Payer: PPO | Admitting: Podiatry

## 2020-02-27 ENCOUNTER — Other Ambulatory Visit: Payer: Self-pay

## 2020-02-27 ENCOUNTER — Encounter: Payer: Self-pay | Admitting: Podiatry

## 2020-02-27 DIAGNOSIS — E039 Hypothyroidism, unspecified: Secondary | ICD-10-CM | POA: Insufficient documentation

## 2020-02-27 DIAGNOSIS — N08 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: Secondary | ICD-10-CM | POA: Insufficient documentation

## 2020-02-27 DIAGNOSIS — B351 Tinea unguium: Secondary | ICD-10-CM | POA: Diagnosis not present

## 2020-02-27 DIAGNOSIS — Z79899 Other long term (current) drug therapy: Secondary | ICD-10-CM | POA: Insufficient documentation

## 2020-02-27 DIAGNOSIS — E1149 Type 2 diabetes mellitus with other diabetic neurological complication: Secondary | ICD-10-CM | POA: Diagnosis not present

## 2020-02-27 DIAGNOSIS — M7661 Achilles tendinitis, right leg: Secondary | ICD-10-CM | POA: Diagnosis not present

## 2020-02-27 DIAGNOSIS — R430 Anosmia: Secondary | ICD-10-CM | POA: Insufficient documentation

## 2020-02-27 MED ORDER — ONETOUCH ULTRA 2 W/DEVICE KIT: PACK | 2 refills | Status: AC

## 2020-02-27 NOTE — Patient Instructions (Signed)
Achilles Tendinitis  with Rehab Achilles tendinitis is a disorder of the Achilles tendon. The Achilles tendon connects the large calf muscles (Gastrocnemius and Soleus) to the heel bone (calcaneus). This tendon is sometimes called the heel cord. It is important for pushing-off and standing on your toes and is important for walking, running, or jumping. Tendinitis is often caused by overuse and repetitive microtrauma. SYMPTOMS  Pain, tenderness, swelling, warmth, and redness may occur over the Achilles tendon even at rest.  Pain with pushing off, or flexing or extending the ankle.  Pain that is worsened after or during activity. CAUSES   Overuse sometimes seen with rapid increase in exercise programs or in sports requiring running and jumping.  Poor physical conditioning (strength and flexibility or endurance).  Running sports, especially training running down hills.  Inadequate warm-up before practice or play or failure to stretch before participation.  Injury to the tendon. PREVENTION   Warm up and stretch before practice or competition.  Allow time for adequate rest and recovery between practices and competition.  Keep up conditioning.  Keep up ankle and leg flexibility.  Improve or keep muscle strength and endurance.  Improve cardiovascular fitness.  Use proper technique.  Use proper equipment (shoes, skates).  To help prevent recurrence, taping, protective strapping, or an adhesive bandage may be recommended for several weeks after healing is complete. PROGNOSIS   Recovery may take weeks to several months to heal.  Longer recovery is expected if symptoms have been prolonged.  Recovery is usually quicker if the inflammation is due to a direct blow as compared with overuse or sudden strain. RELATED COMPLICATIONS   Healing time will be prolonged if the condition is not correctly treated. The injury must be given plenty of time to heal.  Symptoms can reoccur if  activity is resumed too soon.  Untreated, tendinitis may increase the risk of tendon rupture requiring additional time for recovery and possibly surgery. TREATMENT   The first treatment consists of rest anti-inflammatory medication, and ice to relieve the pain.  Stretching and strengthening exercises after resolution of pain will likely help reduce the risk of recurrence. Referral to a physical therapist or athletic trainer for further evaluation and treatment may be helpful.  A walking boot or cast may be recommended to rest the Achilles tendon. This can help break the cycle of inflammation and microtrauma.  Arch supports (orthotics) may be prescribed or recommended by your caregiver as an adjunct to therapy and rest.  Surgery to remove the inflamed tendon lining or degenerated tendon tissue is rarely necessary and has shown less than predictable results. MEDICATION   Nonsteroidal anti-inflammatory medications, such as aspirin and ibuprofen, may be used for pain and inflammation relief. Do not take within 7 days before surgery. Take these as directed by your caregiver. Contact your caregiver immediately if any bleeding, stomach upset, or signs of allergic reaction occur. Other minor pain relievers, such as acetaminophen, may also be used.  Pain relievers may be prescribed as necessary by your caregiver. Do not take prescription pain medication for longer than 4 to 7 days. Use only as directed and only as much as you need.  Cortisone injections are rarely indicated. Cortisone injections may weaken tendons and predispose to rupture. It is better to give the condition more time to heal than to use them. HEAT AND COLD  Cold is used to relieve pain and reduce inflammation for acute and chronic Achilles tendinitis. Cold should be applied for 10 to 15 minutes   every 2 to 3 hours for inflammation and pain and immediately after any activity that aggravates your symptoms. Use ice packs or an ice  massage.  Heat may be used before performing stretching and strengthening activities prescribed by your caregiver. Use a heat pack or a warm soak. SEEK MEDICAL CARE IF:  Symptoms get worse or do not improve in 2 weeks despite treatment.  New, unexplained symptoms develop. Drugs used in treatment may produce side effects.  EXERCISES:  RANGE OF MOTION (ROM) AND STRETCHING EXERCISES - Achilles Tendinitis  These exercises may help you when beginning to rehabilitate your injury. Your symptoms may resolve with or without further involvement from your physician, physical therapist or athletic trainer. While completing these exercises, remember:   Restoring tissue flexibility helps normal motion to return to the joints. This allows healthier, less painful movement and activity.  An effective stretch should be held for at least 30 seconds.  A stretch should never be painful. You should only feel a gentle lengthening or release in the stretched tissue.  STRETCH  Gastroc, Standing   Place hands on wall.  Extend right / left leg, keeping the front knee somewhat bent.  Slightly point your toes inward on your back foot.  Keeping your right / left heel on the floor and your knee straight, shift your weight toward the wall, not allowing your back to arch.  You should feel a gentle stretch in the right / left calf. Hold this position for 10 seconds. Repeat 3 times. Complete this stretch 2 times per day.  STRETCH  Soleus, Standing   Place hands on wall.  Extend right / left leg, keeping the other knee somewhat bent.  Slightly point your toes inward on your back foot.  Keep your right / left heel on the floor, bend your back knee, and slightly shift your weight over the back leg so that you feel a gentle stretch deep in your back calf.  Hold this position for 10 seconds. Repeat 3 times. Complete this stretch 2 times per day.  STRETCH  Gastrocsoleus, Standing  Note: This exercise can place  a lot of stress on your foot and ankle. Please complete this exercise only if specifically instructed by your caregiver.   Place the ball of your right / left foot on a step, keeping your other foot firmly on the same step.  Hold on to the wall or a rail for balance.  Slowly lift your other foot, allowing your body weight to press your heel down over the edge of the step.  You should feel a stretch in your right / left calf.  Hold this position for 10 seconds.  Repeat this exercise with a slight bend in your knee. Repeat 3 times. Complete this stretch 2 times per day.   STRENGTHENING EXERCISES - Achilles Tendinitis These exercises may help you when beginning to rehabilitate your injury. They may resolve your symptoms with or without further involvement from your physician, physical therapist or athletic trainer. While completing these exercises, remember:   Muscles can gain both the endurance and the strength needed for everyday activities through controlled exercises.  Complete these exercises as instructed by your physician, physical therapist or athletic trainer. Progress the resistance and repetitions only as guided.  You may experience muscle soreness or fatigue, but the pain or discomfort you are trying to eliminate should never worsen during these exercises. If this pain does worsen, stop and make certain you are following the directions exactly. If   the pain is still present after adjustments, discontinue the exercise until you can discuss the trouble with your clinician.  STRENGTH - Plantar-flexors   Sit with your right / left leg extended. Holding onto both ends of a rubber exercise band/tubing, loop it around the ball of your foot. Keep a slight tension in the band.  Slowly push your toes away from you, pointing them downward.  Hold this position for 10 seconds. Return slowly, controlling the tension in the band/tubing. Repeat 3 times. Complete this exercise 2 times per day.     STRENGTH - Plantar-flexors   Stand with your feet shoulder width apart. Steady yourself with a wall or table using as little support as needed.  Keeping your weight evenly spread over the width of your feet, rise up on your toes.*  Hold this position for 10 seconds. Repeat 3 times. Complete this exercise 2 times per day.  *If this is too easy, shift your weight toward your right / left leg until you feel challenged. Ultimately, you may be asked to do this exercise with your right / left foot only.  STRENGTH  Plantar-flexors, Eccentric  Note: This exercise can place a lot of stress on your foot and ankle. Please complete this exercise only if specifically instructed by your caregiver.   Place the balls of your feet on a step. With your hands, use only enough support from a wall or rail to keep your balance.  Keep your knees straight and rise up on your toes.  Slowly shift your weight entirely to your right / left toes and pick up your opposite foot. Gently and with controlled movement, lower your weight through your right / left foot so that your heel drops below the level of the step. You will feel a slight stretch in the back of your calf at the end position.  Use the healthy leg to help rise up onto the balls of both feet, then lower weight only on the right / left leg again. Build up to 15 repetitions. Then progress to 3 consecutive sets of 15 repetitions.*  After completing the above exercise, complete the same exercise with a slight knee bend (about 30 degrees). Again, build up to 15 repetitions. Then progress to 3 consecutive sets of 15 repetitions.* Perform this exercise 2 times per day.  *When you easily complete 3 sets of 15, your physician, physical therapist or athletic trainer may advise you to add resistance by wearing a backpack filled with additional weight.  STRENGTH - Plantar Flexors, Seated   Sit on a chair that allows your feet to rest flat on the ground. If  necessary, sit at the edge of the chair.  Keeping your toes firmly on the ground, lift your right / left heel as far as you can without increasing any discomfort in your ankle. Repeat 3 times. Complete this exercise 2 times a day.   Diabetes Mellitus and Foot Care Foot care is an important part of your health, especially when you have diabetes. Diabetes may cause you to have problems because of poor blood flow (circulation) to your feet and legs, which can cause your skin to:  Become thinner and drier.  Break more easily.  Heal more slowly.  Peel and crack. You may also have nerve damage (neuropathy) in your legs and feet, causing decreased feeling in them. This means that you may not notice minor injuries to your feet that could lead to more serious problems. Noticing and addressing any potential  problems early is the best way to prevent future foot problems. How to care for your feet Foot hygiene  Wash your feet daily with warm water and mild soap. Do not use hot water. Then, pat your feet and the areas between your toes until they are completely dry. Do not soak your feet as this can dry your skin.  Trim your toenails straight across. Do not dig under them or around the cuticle. File the edges of your nails with an emery board or nail file.  Apply a moisturizing lotion or petroleum jelly to the skin on your feet and to dry, brittle toenails. Use lotion that does not contain alcohol and is unscented. Do not apply lotion between your toes. Shoes and socks  Wear clean socks or stockings every day. Make sure they are not too tight. Do not wear knee-high stockings since they may decrease blood flow to your legs.  Wear shoes that fit properly and have enough cushioning. Always look in your shoes before you put them on to be sure there are no objects inside.  To break in new shoes, wear them for just a few hours a day. This prevents injuries on your feet. Wounds, scrapes, corns, and  calluses  Check your feet daily for blisters, cuts, bruises, sores, and redness. If you cannot see the bottom of your feet, use a mirror or ask someone for help.  Do not cut corns or calluses or try to remove them with medicine.  If you find a minor scrape, cut, or break in the skin on your feet, keep it and the skin around it clean and dry. You may clean these areas with mild soap and water. Do not clean the area with peroxide, alcohol, or iodine.  If you have a wound, scrape, corn, or callus on your foot, look at it several times a day to make sure it is healing and not infected. Check for: ? Redness, swelling, or pain. ? Fluid or blood. ? Warmth. ? Pus or a bad smell. General instructions  Do not cross your legs. This may decrease blood flow to your feet.  Do not use heating pads or hot water bottles on your feet. They may burn your skin. If you have lost feeling in your feet or legs, you may not know this is happening until it is too late.  Protect your feet from hot and cold by wearing shoes, such as at the beach or on hot pavement.  Schedule a complete foot exam at least once a year (annually) or more often if you have foot problems. If you have foot problems, report any cuts, sores, or bruises to your health care provider immediately. Contact a health care provider if:  You have a medical condition that increases your risk of infection and you have any cuts, sores, or bruises on your feet.  You have an injury that is not healing.  You have redness on your legs or feet.  You feel burning or tingling in your legs or feet.  You have pain or cramps in your legs and feet.  Your legs or feet are numb.  Your feet always feel cold.  You have pain around a toenail. Get help right away if:  You have a wound, scrape, corn, or callus on your foot and: ? You have pain, swelling, or redness that gets worse. ? You have fluid or blood coming from the wound, scrape, corn, or  callus. ? Your wound, scrape, corn, or  callus feels warm to the touch. ? You have pus or a bad smell coming from the wound, scrape, corn, or callus. ? You have a fever. ? You have a red line going up your leg. Summary  Check your feet every day for cuts, sores, red spots, swelling, and blisters.  Moisturize feet and legs daily.  Wear shoes that fit properly and have enough cushioning.  If you have foot problems, report any cuts, sores, or bruises to your health care provider immediately.  Schedule a complete foot exam at least once a year (annually) or more often if you have foot problems. This information is not intended to replace advice given to you by your health care provider. Make sure you discuss any questions you have with your health care provider. Document Revised: 02/01/2019 Document Reviewed: 06/12/2016 Elsevier Patient Education  [employer].

## 2020-02-29 NOTE — Progress Notes (Signed)
Subjective:   Patient ID: Brooke Brown, female   DOB: 69 y.o.   MRN: [id number]   HPI 69 year old female presents the office today for diabetic foot exam.  She denies any history of ulcerations and she has no claudication symptoms.  She does not occasional tingling at nighttime but not on a consistent basis.  Also she reports pain in the toenails but no significant pain.  She also states that last couple weeks she started developed some tenderness to the right Achilles but no recent injury no recent treatment.  No swelling but she reports.  She has no other concerns today.   Review of Systems  All other systems reviewed and are negative.  Past Medical History:  Diagnosis Date  . Anxiety   . Arthritis   . Diabetes mellitus   . Grave's disease   . Grave's disease   . Hyperlipemia   . Hypertension     Past Surgical History:  Procedure Laterality Date  . ABDOMINAL HYSTERECTOMY    . CATARACT EXTRACTION W/ INTRAOCULAR LENS  IMPLANT, BILATERAL Bilateral   . CHOLECYSTECTOMY N/A 08/16/2017   Procedure: LAPAROSCOPIC CHOLECYSTECTOMY;  Surgeon: Judeth Horn, MD;  Location: [facility];  Service: General;  Laterality: N/A;  . TUBAL LIGATION       Current Outpatient Medications:  .  acetaminophen (TYLENOL) 500 MG tablet, Take 500 mg by mouth daily as needed for moderate pain or headache., Disp: , Rfl:  .  amLODipine (NORVASC) 5 MG tablet, Take 1 tablet by mouth once daily, Disp: 90 tablet, Rfl: 1 .  aspirin EC 81 MG tablet, Take 81 mg by mouth daily., Disp: , Rfl:  .  Blood Glucose Monitoring Suppl (ONE TOUCH ULTRA 2) w/Device KIT, Use to check blood sugar 3 times a day. Dx code e11.65, Disp: 1 kit, Rfl: 2 .  calcium carbonate (OSCAL) 1500 (600 Ca) MG TABS tablet, Take 600 mg of elemental calcium by mouth daily., Disp: , Rfl:  .  Carboxymethylcellul-Glycerin (LUBRICATING EYE DROPS OP), Place 1 drop into both eyes 2 (two) times daily., Disp: , Rfl:  .  Cholecalciferol (VITAMIN D3) 125  MCG (5000 UT) CAPS, Take 1 capsule (5,000 Units total) by mouth daily. Do not take on saturday and Sunday., Disp: 30 capsule, Rfl: 0 .  hydrochlorothiazide (HYDRODIURIL) 25 MG tablet, Take 1 tablet by mouth once daily, Disp: 90 tablet, Rfl: 1 .  Magnesium 250 MG TABS, otc, Disp:  , Rfl: 0 .  metFORMIN (GLUCOPHAGE-XR) 500 MG 24 hr tablet, Take 1 tablet (500 mg total) by mouth 2 (two) times daily., Disp: 180 tablet, Rfl: 2 .  Multiple Vitamin (MULTIVITAMIN WITH MINERALS) TABS tablet, Take 1 tablet by mouth daily., Disp: , Rfl:  .  olmesartan (BENICAR) 40 MG tablet, Take 1 tablet (40 mg total) by mouth daily., Disp: 90 tablet, Rfl: 2 .  OneTouch Delica Lancets 45G MISC, USE AS DIRECTED TO CHECK BLOOD SUGARS ONCE DAILY, Disp: 100 each, Rfl: 2 .  ONETOUCH ULTRA test strip, USE AS INSTRUCTED TO CHECK BLOOD SUGARS ONE TIME PER DAY, Disp: 100 each, Rfl: 2 .  Pitavastatin Calcium (LIVALO) 2 MG TABS, Take 1 tablet (2 mg total) by mouth daily., Disp: 90 tablet, Rfl: 1 .  SYNTHROID 112 MCG tablet, Take 1 tablet by mouth once daily, Disp: 90 tablet, Rfl: 0  No Known Allergies       Objective:  Physical Exam  General: AAO x3, NAD  Dermatological: Nails are mildly hypertrophic, dystrophic with brown discoloration.  There is no pain in the nails there is no redness or drainage or any signs of infection.  There is no open lesions.  Vascular: Dorsalis Pedis artery and Posterior Tibial artery pedal pulses are 2/4 bilateral with immedate capillary fill time.There is no pain with calf compression, swelling, warmth, erythema.   Neruologic: Grossly intact via light touch bilateral.  Sensation intact with Semmes Weinstein monofilament.  Vibratory sensation intact as well.  Musculoskeletal: Mild discomfort on the mid substance the Achilles tendon on the right side.  No evidence of rupture or tear to the tendon there is no edema.  No pain to the calcaneus.  No other areas of discomfort.  Muscular strength 5/5 in all  groups tested bilateral.  Gait: Unassisted, Nonantalgic.       Assessment:   69 year old female with type 2 diabetes with concerns for mild neuropathy; Achilles tendonitis; onychomycosis     Plan:  -Treatment options discussed including all alternatives, risks, and complications -Etiology of symptoms were discussed -From a diabetic standpoint she is doing well she has some tingling at nighttime.  Does not appear to be causing significant issues and not consistent.  We will monitor this.  Discussed a B complex vitamin.  We discussed daily foot inspection and wearing shoes/covering on the feet. -Debrided the nails and complications of bleeding.  She can use an over-the-counter Fungi-Nail -Regards to the Achilles discussed stretching, icing daily. Heel lifts dispensed  Trula Slade DPM

## 2020-03-01 DIAGNOSIS — Z01419 Encounter for gynecological examination (general) (routine) without abnormal findings: Secondary | ICD-10-CM | POA: Diagnosis not present

## 2020-04-23 ENCOUNTER — Other Ambulatory Visit: Payer: Self-pay | Admitting: Internal Medicine

## 2020-04-30 ENCOUNTER — Ambulatory Visit (INDEPENDENT_AMBULATORY_CARE_PROVIDER_SITE_OTHER): Payer: PPO | Admitting: Internal Medicine

## 2020-04-30 ENCOUNTER — Other Ambulatory Visit: Payer: Self-pay

## 2020-04-30 ENCOUNTER — Encounter: Payer: Self-pay | Admitting: Internal Medicine

## 2020-04-30 VITALS — BP 126/78 | HR 74 | Temp 98.3°F | Ht 64.8 in | Wt 161.0 lb

## 2020-04-30 DIAGNOSIS — E1122 Type 2 diabetes mellitus with diabetic chronic kidney disease: Secondary | ICD-10-CM

## 2020-04-30 DIAGNOSIS — M4802 Spinal stenosis, cervical region: Secondary | ICD-10-CM | POA: Diagnosis not present

## 2020-04-30 DIAGNOSIS — I700 Atherosclerosis of aorta: Secondary | ICD-10-CM

## 2020-04-30 DIAGNOSIS — Z6826 Body mass index (BMI) 26.0-26.9, adult: Secondary | ICD-10-CM | POA: Diagnosis not present

## 2020-04-30 DIAGNOSIS — I129 Hypertensive chronic kidney disease with stage 1 through stage 4 chronic kidney disease, or unspecified chronic kidney disease: Secondary | ICD-10-CM | POA: Diagnosis not present

## 2020-04-30 DIAGNOSIS — E890 Postprocedural hypothyroidism: Secondary | ICD-10-CM | POA: Diagnosis not present

## 2020-04-30 DIAGNOSIS — N182 Chronic kidney disease, stage 2 (mild): Secondary | ICD-10-CM

## 2020-04-30 NOTE — Progress Notes (Signed)
I,Brooke Brown,acting as a Education administrator for Brooke Greenland, MD.,have documented all relevant documentation on the behalf of Brooke Greenland, MD,as directed by  Brooke Greenland, MD while in the presence of Brooke Greenland, MD.  This visit occurred during the SARS-CoV-2 public health emergency.  Safety protocols were in place, including screening questions prior to the visit, additional usage of staff PPE, and extensive cleaning of exam room while observing appropriate contact time as indicated for disinfecting solutions.  Subjective:     Patient ID: Brooke Brown , female    DOB: December 18, 1950 , 69 y.o.   MRN: [id number]   Chief Complaint  Patient presents with  . Diabetes  . Hypertension    HPI  She presents today for diabetes, blood pressure check. She reports compliance with meds.   Diabetes She presents for her follow-up diabetic visit. She has type 2 diabetes mellitus. Her disease course has been stable. There are no hypoglycemic associated symptoms. Pertinent negatives for diabetes include no blurred vision and no chest pain. There are no hypoglycemic complications. Diabetic complications include nephropathy. Risk factors for coronary artery disease include diabetes mellitus, dyslipidemia, hypertension, post-menopausal and sedentary lifestyle. Her weight is stable. She is following a diabetic diet. She participates in exercise three times a week. Her breakfast blood glucose is taken between 8-9 am. Her breakfast blood glucose range is generally 110-130 mg/dl. Eye exam is current.  Hyperlipidemia Pertinent negatives include no chest pain or shortness of breath.  Hypertension This is a chronic problem. The current episode started more than 1 year ago. The problem has been gradually improving since onset. The problem is controlled. Associated symptoms include neck pain. Pertinent negatives include no blurred vision, chest pain, palpitations or shortness of breath.     Past Medical  History:  Diagnosis Date  . Anxiety   . Arthritis   . Diabetes mellitus   . Grave's disease   . Grave's disease   . Hyperlipemia   . Hypertension      Family History  Problem Relation Age of Onset  . Pancreatic cancer Mother   . Heart failure Father   . Diabetes Brother   . Breast cancer Cousin      Current Outpatient Medications:  .  acetaminophen (TYLENOL) 500 MG tablet, Take 500 mg by mouth daily as needed for moderate pain or headache., Disp: , Rfl:  .  amLODipine (NORVASC) 5 MG tablet, Take 1 tablet by mouth once daily, Disp: 90 tablet, Rfl: 1 .  aspirin EC 81 MG tablet, Take 81 mg by mouth daily., Disp: , Rfl:  .  Blood Glucose Monitoring Suppl (ONE TOUCH ULTRA 2) w/Device KIT, Use to check blood sugar 3 times a day. Dx code e11.65, Disp: 1 kit, Rfl: 2 .  calcium carbonate (OSCAL) 1500 (600 Ca) MG TABS tablet, Take 600 mg of elemental calcium by mouth daily., Disp: , Rfl:  .  Carboxymethylcellul-Glycerin (LUBRICATING EYE DROPS OP), Place 1 drop into both eyes 2 (two) times daily., Disp: , Rfl:  .  Cholecalciferol (VITAMIN D3) 125 MCG (5000 UT) CAPS, Take 1 capsule (5,000 Units total) by mouth daily. Do not take on saturday and Sunday., Disp: 30 capsule, Rfl: 0 .  hydrochlorothiazide (HYDRODIURIL) 25 MG tablet, Take 1 tablet by mouth once daily, Disp: 90 tablet, Rfl: 1 .  Magnesium 250 MG TABS, otc, Disp:  , Rfl: 0 .  metFORMIN (GLUCOPHAGE-XR) 500 MG 24 hr tablet, Take 1 tablet (500 mg total) by mouth 2 (  two) times daily., Disp: 180 tablet, Rfl: 2 .  Multiple Vitamin (MULTIVITAMIN WITH MINERALS) TABS tablet, Take 1 tablet by mouth daily., Disp: , Rfl:  .  olmesartan (BENICAR) 40 MG tablet, Take 1 tablet (40 mg total) by mouth daily., Disp: 90 tablet, Rfl: 2 .  OneTouch Delica Lancets 63O MISC, USE AS DIRECTED TO CHECK BLOOD SUGARS ONCE DAILY, Disp: 100 each, Rfl: 2 .  ONETOUCH ULTRA test strip, USE AS INSTRUCTED TO CHECK BLOOD SUGARS ONE TIME PER DAY, Disp: 100 each, Rfl: 2 .   Pitavastatin Calcium (LIVALO) 2 MG TABS, Take 1 tablet (2 mg total) by mouth daily., Disp: 90 tablet, Rfl: 1 .  SYNTHROID 112 MCG tablet, Take 1 tablet by mouth once daily (Patient taking differently: 1 tablet Mon- Sat, 1/2 pill Sun), Disp: 90 tablet, Rfl: 0   No Known Allergies   Review of Systems  Constitutional: Negative.   Eyes: Negative for blurred vision.  Respiratory: Negative.  Negative for shortness of breath.   Cardiovascular: Negative.  Negative for chest pain and palpitations.  Gastrointestinal: Negative.   Musculoskeletal: Positive for neck pain.       She c/o worsening neck pain and nerve pain. She is followed by Ortho for CTS. However, she is now having pain that radiates from the left side of her neck past her elbow. Described as a sharp, shooting pain. Denies LUE weakness. There is also pain with movement of LUE. Denies fall/trauma. Has h/o neck issues. Her pain has worsened over the past several weeks   Psychiatric/Behavioral: Negative.   All other systems reviewed and are negative.    Today's Vitals   04/30/20 1033  BP: 126/78  Pulse: 74  Temp: 98.3 F (36.8 C)  TempSrc: Oral  Weight: 161 lb (73 kg)  Height: 5' 4.8" (1.646 m)  PainSc: 1   PainLoc: Wrist   Body mass index is 26.96 kg/m.  Wt Readings from Last 3 Encounters:  04/30/20 161 lb (73 kg)  02/13/20 163 lb (73.9 kg)  12/26/19 163 lb 3.2 oz (74 kg)   Objective:  Physical Exam Vitals and nursing note reviewed.  Constitutional:      Appearance: Normal appearance. She is obese.  HENT:     Head: Normocephalic and atraumatic.  Cardiovascular:     Rate and Rhythm: Normal rate and regular rhythm.     Heart sounds: Normal heart sounds.  Pulmonary:     Breath sounds: Normal breath sounds.  Musculoskeletal:     Cervical back: Normal range of motion. Tenderness present.  Lymphadenopathy:     Cervical: No cervical adenopathy.  Skin:    General: Skin is warm.  Neurological:     General: No focal  deficit present.     Mental Status: She is alert and oriented to person, place, and time.         Assessment And Plan:     1. Type 2 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 disease, without long-term current use of insulin (HCC) Comments: Chronic, I will check labs as listed below. Encouraged to continue with her regular exercise regimen. WE also discussed use of GLP-1 agonists and SGLT2 inhibitors for improved glycemic control. She prefers to discuss further once her lab results are available for review.  - BMP8+EGFR - Hemoglobin A1c - C-peptide  2. Benign hypertensive renal disease Comments: Chronic, well controlled. She will continue with current meds. Encouraged to follow low sodium diet.   3. Atherosclerosis of aorta (HCC) Comments: Chronic. Seen  on Ct scan Jan 2019.  She is encouraged to follow a heart healthy lifestyle. Also advised to comply with statin therapy.   4. Postablative hypothyroidism Comments: I will check thyroid function today and adjust meds as needed.  - TSH - T4, free  5. Cervical spinal stenosis Comments: Chronic. Her sx appear to be worsening. I will schedule her for MRI cervical spine. She agrees to specialist eval as needed.  - MR Cervical Spine Wo Contrast; Future  6. BMI 26.0-26.9,adult Comments: BMI is stable for her demographic. She is encouraged to aim for at least 150 minutes of exercise per week.     Patient was given opportunity to ask questions. Patient verbalized understanding of the plan and was able to repeat key elements of the plan. All questions were answered to their satisfaction.  Brooke Greenland, MD   I, Brooke Greenland, MD, have reviewed all documentation for this visit. The documentation on 05/01/20 for the exam, diagnosis, procedures, and orders are all accurate and complete.  THE PATIENT IS ENCOURAGED TO PRACTICE SOCIAL DISTANCING DUE TO THE COVID-19 PANDEMIC.

## 2020-04-30 NOTE — Patient Instructions (Signed)

## 2020-05-01 LAB — C-PEPTIDE: C-Peptide: 2.3 ng/mL (ref 1.1–4.4)

## 2020-05-01 LAB — T4, FREE: Free T4: 1.66 ng/dL (ref 0.82–1.77)

## 2020-05-01 LAB — BMP8+EGFR
BUN/Creatinine Ratio: 14 (ref 12–28)
BUN: 12 mg/dL (ref 8–27)
CO2: 26 mmol/L (ref 20–29)
Calcium: 10.6 mg/dL — ABNORMAL HIGH (ref 8.7–10.3)
Chloride: 103 mmol/L (ref 96–106)
Creatinine, Ser: 0.84 mg/dL (ref 0.57–1.00)
GFR calc Af Amer: 81 mL/min/{1.73_m2} (ref >59)
GFR calc non Af Amer: 70 mL/min/{1.73_m2} (ref >59)
Glucose: 97 mg/dL (ref 65–99)
Potassium: 4.2 mmol/L (ref 3.5–5.2)
Sodium: 142 mmol/L (ref 134–144)

## 2020-05-01 LAB — HEMOGLOBIN A1C
Est. average glucose Bld gHb Est-mCnc: 131 mg/dL
Hgb A1c MFr Bld: 6.2 % — ABNORMAL HIGH (ref 4.8–5.6)

## 2020-05-01 LAB — TSH: TSH: 1.29 u[IU]/mL (ref 0.450–4.500)

## 2020-05-06 ENCOUNTER — Encounter: Payer: Self-pay | Admitting: Internal Medicine

## 2020-05-21 ENCOUNTER — Ambulatory Visit
Admission: RE | Admit: 2020-05-21 | Discharge: 2020-05-21 | Disposition: A | Discharge status: Home / Self Care | Payer: PPO | Source: Ambulatory Visit | Attending: Internal Medicine | Admitting: Internal Medicine

## 2020-05-21 ENCOUNTER — Other Ambulatory Visit: Payer: Self-pay

## 2020-05-21 DIAGNOSIS — M4802 Spinal stenosis, cervical region: Secondary | ICD-10-CM

## 2020-06-18 ENCOUNTER — Other Ambulatory Visit: Payer: Self-pay | Admitting: Internal Medicine

## 2020-06-26 DIAGNOSIS — H33321 Round hole, right eye: Secondary | ICD-10-CM | POA: Diagnosis not present

## 2020-06-26 DIAGNOSIS — H43813 Vitreous degeneration, bilateral: Secondary | ICD-10-CM | POA: Diagnosis not present

## 2020-06-26 DIAGNOSIS — H26491 Other secondary cataract, right eye: Secondary | ICD-10-CM | POA: Diagnosis not present

## 2020-06-26 DIAGNOSIS — Z961 Presence of intraocular lens: Secondary | ICD-10-CM | POA: Diagnosis not present

## 2020-06-26 DIAGNOSIS — E119 Type 2 diabetes mellitus without complications: Secondary | ICD-10-CM | POA: Diagnosis not present

## 2020-06-26 LAB — HM DIABETES EYE EXAM

## 2020-07-29 ENCOUNTER — Other Ambulatory Visit: Payer: Self-pay | Admitting: Internal Medicine

## 2020-08-12 ENCOUNTER — Other Ambulatory Visit: Payer: Self-pay | Admitting: Internal Medicine

## 2020-08-15 ENCOUNTER — Encounter: Payer: Self-pay | Admitting: Internal Medicine

## 2020-08-15 ENCOUNTER — Other Ambulatory Visit: Payer: Self-pay

## 2020-08-15 ENCOUNTER — Ambulatory Visit (INDEPENDENT_AMBULATORY_CARE_PROVIDER_SITE_OTHER): Payer: PPO | Admitting: Internal Medicine

## 2020-08-15 VITALS — BP 126/72 | HR 73 | Temp 98.1°F | Ht 64.0 in | Wt 158.4 lb

## 2020-08-15 DIAGNOSIS — I700 Atherosclerosis of aorta: Secondary | ICD-10-CM | POA: Diagnosis not present

## 2020-08-15 DIAGNOSIS — E1122 Type 2 diabetes mellitus with diabetic chronic kidney disease: Secondary | ICD-10-CM | POA: Diagnosis not present

## 2020-08-15 DIAGNOSIS — E7800 Pure hypercholesterolemia, unspecified: Secondary | ICD-10-CM

## 2020-08-15 DIAGNOSIS — I129 Hypertensive chronic kidney disease with stage 1 through stage 4 chronic kidney disease, or unspecified chronic kidney disease: Secondary | ICD-10-CM

## 2020-08-15 DIAGNOSIS — N182 Chronic kidney disease, stage 2 (mild): Secondary | ICD-10-CM | POA: Diagnosis not present

## 2020-08-15 DIAGNOSIS — M4802 Spinal stenosis, cervical region: Secondary | ICD-10-CM

## 2020-08-15 DIAGNOSIS — Z6827 Body mass index (BMI) 27.0-27.9, adult: Secondary | ICD-10-CM | POA: Diagnosis not present

## 2020-08-15 NOTE — Progress Notes (Signed)
I,[name],acting as a Education administrator for [name], MD.,have documented all relevant documentation on the behalf of [name], MD,as directed by  [name], MD while in the presence of [name], MD. This visit occurred during the SARS-CoV-2 public health emergency.  Safety protocols were in place, including screening questions prior to the visit, additional usage of staff PPE, and extensive cleaning of exam room while observing appropriate contact time as indicated for disinfecting solutions.  Subjective:     Patient ID: Brooke Brown , female    DOB: 06/02/1950 , 70 y.o.   MRN: [id number]   Chief Complaint  Patient presents with  . Hypertension  . Diabetes    HPI  She presents today for diabetes, blood pressure check. She reports compliance with meds. She admits she has been exercising regularly. Feels good.   Hypertension This is a chronic problem. The current episode started more than 1 year ago. The problem has been gradually improving since onset. The problem is controlled. Associated symptoms include neck pain. Pertinent negatives include no blurred vision, chest pain, palpitations or shortness of breath.  Diabetes She presents for her follow-up diabetic visit. She has type 2 diabetes mellitus. Her disease course has been stable. There are no hypoglycemic associated symptoms. Pertinent negatives for diabetes include no blurred vision and no chest pain. There are no hypoglycemic complications. Diabetic complications include nephropathy. Risk factors for coronary artery disease include diabetes mellitus, dyslipidemia, hypertension, post-menopausal and sedentary lifestyle. Her weight is stable. She is following a diabetic diet. She participates in exercise three times a week. Her breakfast blood glucose is taken between 8-9 am. Her breakfast blood glucose range is generally 110-130 mg/dl. Eye exam is current.  Hyperlipidemia Pertinent negatives include  no chest pain or shortness of breath.     Past Medical History:  Diagnosis Date  . Anxiety   . Arthritis   . Diabetes mellitus   . Grave's disease   . Grave's disease   . Hyperlipemia   . Hypertension      Family History  Problem Relation Age of Onset  . Pancreatic cancer Mother   . Heart failure Father   . Diabetes Brother   . Breast cancer Cousin      Current Outpatient Medications:  .  acetaminophen (TYLENOL) 500 MG tablet, Take 500 mg by mouth daily as needed for moderate pain or headache., Disp: , Rfl:  .  amLODipine (NORVASC) 5 MG tablet, Take 1 tablet by mouth once daily, Disp: 90 tablet, Rfl: 0 .  aspirin EC 81 MG tablet, Take 81 mg by mouth daily., Disp: , Rfl:  .  Blood Glucose Monitoring Suppl (ONE TOUCH ULTRA 2) w/Device KIT, Use to check blood sugar 3 times a day. Dx code e11.65, Disp: 1 kit, Rfl: 2 .  calcium carbonate (OSCAL) 1500 (600 Ca) MG TABS tablet, Take 600 mg of elemental calcium by mouth daily., Disp: , Rfl:  .  Carboxymethylcellul-Glycerin (LUBRICATING EYE DROPS OP), Place 1 drop into both eyes 2 (two) times daily., Disp: , Rfl:  .  Cholecalciferol (VITAMIN D3) 125 MCG (5000 UT) CAPS, Take 1 capsule (5,000 Units total) by mouth daily. Do not take on saturday and Sunday., Disp: 30 capsule, Rfl: 0 .  hydrochlorothiazide (HYDRODIURIL) 25 MG tablet, Take 1 tablet by mouth once daily, Disp: 90 tablet, Rfl: 0 .  LIVALO 2 MG TABS, Take 1 tablet by mouth once daily, Disp: 90 tablet, Rfl: 0 .  Magnesium 250  MG TABS, otc, Disp:  , Rfl: 0 .  metFORMIN (GLUCOPHAGE-XR) 500 MG 24 hr tablet, Take 1 tablet (500 mg total) by mouth 2 (two) times daily., Disp: 180 tablet, Rfl: 2 .  Multiple Vitamin (MULTIVITAMIN WITH MINERALS) TABS tablet, Take 1 tablet by mouth daily., Disp: , Rfl:  .  olmesartan (BENICAR) 40 MG tablet, Take 1 tablet (40 mg total) by mouth daily., Disp: 90 tablet, Rfl: 2 .  OneTouch Delica Lancets 93Z MISC, USE AS DIRECTED TO CHECK BLOOD SUGARS ONCE DAILY,  Disp: 100 each, Rfl: 2 .  ONETOUCH ULTRA test strip, USE AS INSTRUCTED TO CHECK BLOOD SUGARS ONE TIME PER DAY, Disp: 100 each, Rfl: 2 .  SYNTHROID 112 MCG tablet, 1 tablet Mon- Sat, 1/2 pill Sun, Disp: 90 tablet, Rfl: 0   No Known Allergies   Review of Systems  Constitutional: Negative.   Eyes: Negative for blurred vision.  Respiratory: Negative.  Negative for shortness of breath.   Cardiovascular: Negative.  Negative for chest pain and palpitations.  Gastrointestinal: Negative.   Musculoskeletal: Positive for neck pain.  Psychiatric/Behavioral: Negative.   All other systems reviewed and are negative.    Today's Vitals   08/15/20 0958  BP: 126/72  Pulse: 73  Temp: 98.1 F (36.7 C)  SpO2: 99%  Weight: 158 lb 6.4 oz (71.8 kg)  Height: 5' 4"  (1.626 m)  PainSc: 4    Body mass index is 27.19 kg/m.  Wt Readings from Last 3 Encounters:  08/15/20 158 lb 6.4 oz (71.8 kg)  04/30/20 161 lb (73 kg)  02/13/20 163 lb (73.9 kg)   Objective:  Physical Exam Vitals and nursing note reviewed.  Constitutional:      Appearance: Normal appearance.  HENT:     Head: Normocephalic and atraumatic.     Left Ear: Impacted cerumen: tsh.     Nose:     Comments: Masked     Mouth/Throat:     Comments: Masked  Cardiovascular:     Rate and Rhythm: Normal rate and regular rhythm.     Heart sounds: Normal heart sounds.  Pulmonary:     Effort: Pulmonary effort is normal.     Breath sounds: Normal breath sounds.  Musculoskeletal:     Cervical back: Normal range of motion.  Skin:    General: Skin is warm.  Neurological:     General: No focal deficit present.     Mental Status: She is alert.     Motor: No weakness.  Psychiatric:        Mood and Affect: Mood normal.        Behavior: Behavior normal.         Assessment And Plan:     1. Type 2 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 disease, without long-term current use of insulin (HCC) Comments: Chronic, I will check labs as  listed below. Encouraged to limit her intake of sugary drinks. F/u in 4 months.  - Hemoglobin A1c - CMP14+EGFR - Lipid panel  2. Benign hypertensive renal disease Comments: Well controlled. Advised to follow a low sodium diet.  - CMP14+EGFR - TSH  3. Pure hypercholesterolemia Comments: Chronic, currently on statin therapy. She will c/w Livalo 65m daily for now.  - Lipid panel  4. Cervical spinal stenosis Comments: Dec 2021 MRI results reviewed. Her sx appear to be worsening. Agrees to N/S eval - will spinal injection be helpful? Or is PT better route to take? - Ambulatory referral to Neurosurgery  5.  Atherosclerosis of aorta (HCC) Chronic, encouraged to follow heart healthy lifestyle. Clean eating, regular exercise and stress management are all a part of this lifestyle. She is also encouraged to continue with statin therapy.   6. BMI 27.0-27.9,adult Comments: She has lost 5 pounds since Sept 2021. Admits to eating less. I will check TSH to be sure she is not over-treated.   Patient was given opportunity to ask questions. Patient verbalized understanding of the plan and was able to repeat key elements of the plan. All questions were answered to their satisfaction.   I, [name], MD, have reviewed all documentation for this visit. The documentation on 08/15/20 for the exam, diagnosis, procedures, and orders are all accurate and complete.   IF YOU HAVE BEEN REFERRED TO A SPECIALIST, IT MAY TAKE 1-2 WEEKS TO SCHEDULE/PROCESS THE REFERRAL. IF YOU HAVE NOT HEARD FROM US/SPECIALIST IN TWO WEEKS, PLEASE GIVE [place] A CALL AT [phone] X [phone].   THE PATIENT IS ENCOURAGED TO PRACTICE SOCIAL DISTANCING DUE TO THE COVID-19 PANDEMIC.

## 2020-08-15 NOTE — Patient Instructions (Signed)
Diabetes Mellitus and Exercise Exercising regularly is important for overall health, especially for people who have diabetes mellitus. Exercising is not only about losing weight. It has many other health benefits, such as increasing muscle strength and bone density and reducing body fat and stress. This leads to improved fitness, flexibility, and endurance, all of which result in better overall health. What are the benefits of exercise if I have diabetes? Exercise has many benefits for people with diabetes. They include:  Helping to lower and control blood sugar (glucose).  Helping the body to respond better to the hormone insulin by improving insulin sensitivity.  Reducing how much insulin the body needs.  Lowering the risk for heart disease by: ? Lowering "bad" cholesterol and triglyceride levels. ? Increasing "good" cholesterol levels. ? Lowering blood pressure. ? Lowering blood glucose levels. What is my activity plan? Your health care provider or certified diabetes educator can help you make a plan for the type and frequency of exercise that works for you. This is called your activity plan. Be sure to:  Get at least 150 minutes of medium-intensity or high-intensity exercise each week. Exercises may include brisk walking, biking, or water aerobics.  Do stretching and strengthening exercises, such as yoga or weight lifting, at least 2 times a week.  Spread out your activity over at least 3 days of the week.  Get some form of physical activity each day. ? Do not go more than 2 days in a row without some kind of physical activity. ? Avoid being inactive for more than 90 minutes at a time. Take frequent breaks to walk or stretch.  Choose exercises or activities that you enjoy. Set realistic goals.  Start slowly and gradually increase your exercise intensity over time.   How do I manage my diabetes during exercise? Monitor your blood glucose  Check your blood glucose before and  after exercising. If your blood glucose is: ? 240 mg/dL (13.3 mmol/L) or higher before you exercise, check your urine for ketones. These are chemicals created by the liver. If you have ketones in your urine, do not exercise until your blood glucose returns to normal. ? 100 mg/dL (5.6 mmol/L) or lower, eat a snack containing 15-20 grams of carbohydrate. Check your blood glucose 15 minutes after the snack to make sure that your glucose level is above 100 mg/dL (5.6 mmol/L) before you start your exercise.  Know the symptoms of low blood glucose (hypoglycemia) and how to treat it. Your risk for hypoglycemia increases during and after exercise. Follow these tips and your health care provider's instructions  Keep a carbohydrate snack that is fast-acting for use before, during, and after exercise to help prevent or treat hypoglycemia.  Avoid injecting insulin into areas of the body that are going to be exercised. For example, avoid injecting insulin into: ? Your arms, when you are about to play tennis. ? Your legs, when you are about to go jogging.  Keep records of your exercise habits. Doing this can help you and your health care provider adjust your diabetes management plan as needed. Write down: ? Food that you eat before and after you exercise. ? Blood glucose levels before and after you exercise. ? The type and amount of exercise you have done.  Work with your health care provider when you start a new exercise or activity. He or she may need to: ? Make sure that the activity is safe for you. ? Adjust your insulin, other medicines, and food that   you eat.  Drink plenty of water while you exercise. This prevents loss of water (dehydration) and problems caused by a lot of heat in the body (heat stroke).   Where to find more information  American Diabetes Association: www.diabetes.org Summary  Exercising regularly is important for overall health, especially for people who have diabetes  mellitus.  Exercising has many health benefits. It increases muscle strength and bone density and reduces body fat and stress. It also lowers and controls blood glucose.  Your health care provider or certified diabetes educator can help you make an activity plan for the type and frequency of exercise that works for you.  Work with your health care provider to make sure any new activity is safe for you. Also work with your health care provider to adjust your insulin, other medicines, and the food you eat. This information is not intended to replace advice given to you by your health care provider. Make sure you discuss any questions you have with your health care provider. Document Revised: 02/06/2019 Document Reviewed: 02/06/2019 Elsevier Patient Education  2021 [employer]  

## 2020-08-16 LAB — LIPID PANEL
Chol/HDL Ratio: 2.9 ratio (ref 0.0–4.4)
Cholesterol, Total: 159 mg/dL (ref 100–199)
HDL: 54 mg/dL (ref >39)
LDL Chol Calc (NIH): 80 mg/dL (ref 0–99)
Triglycerides: 144 mg/dL (ref 0–149)
VLDL Cholesterol Cal: 25 mg/dL (ref 5–40)

## 2020-08-16 LAB — CMP14+EGFR
ALT: 13 IU/L (ref 0–32)
AST: 25 IU/L (ref 0–40)
Albumin/Globulin Ratio: 1.4 (ref 1.2–2.2)
Albumin: 4.3 g/dL (ref 3.7–4.7)
Alkaline Phosphatase: 55 IU/L (ref 44–121)
BUN/Creatinine Ratio: 15 (ref 12–28)
BUN: 12 mg/dL (ref 8–27)
Bilirubin Total: 0.3 mg/dL (ref 0.0–1.2)
CO2: 24 mmol/L (ref 20–29)
Calcium: 9.8 mg/dL (ref 8.7–10.3)
Chloride: 102 mmol/L (ref 96–106)
Creatinine, Ser: 0.78 mg/dL (ref 0.57–1.00)
Globulin, Total: 3.1 g/dL (ref 1.5–4.5)
Glucose: 98 mg/dL (ref 65–99)
Potassium: 4 mmol/L (ref 3.5–5.2)
Sodium: 141 mmol/L (ref 134–144)
Total Protein: 7.4 g/dL (ref 6.0–8.5)
eGFR: 81 mL/min/{1.73_m2} (ref >59)

## 2020-08-16 LAB — HEMOGLOBIN A1C
Est. average glucose Bld gHb Est-mCnc: 140 mg/dL
Hgb A1c MFr Bld: 6.5 % — ABNORMAL HIGH (ref 4.8–5.6)

## 2020-08-16 LAB — TSH: TSH: 1.18 u[IU]/mL (ref 0.450–4.500)

## 2020-08-18 ENCOUNTER — Encounter: Payer: Self-pay | Admitting: Internal Medicine

## 2020-08-29 ENCOUNTER — Ambulatory Visit (INDEPENDENT_AMBULATORY_CARE_PROVIDER_SITE_OTHER): Payer: PPO

## 2020-08-29 VITALS — Ht 65.0 in | Wt 156.0 lb

## 2020-08-29 DIAGNOSIS — Z0000 Encounter for general adult medical examination without abnormal findings: Secondary | ICD-10-CM

## 2020-08-29 NOTE — Progress Notes (Signed)
I connected with Brooke Brown today by telephone and verified that I am speaking with the correct person using two identifiers. Location patient: home Location provider: work Persons participating in the virtual visit: Mella Inclan, Glenna Durand LPN.   I discussed the limitations, risks, security and privacy concerns of performing an evaluation and management service by telephone and the availability of in person appointments. I also discussed with the patient that there may be a patient responsible charge related to this service. The patient expressed understanding and verbally consented to this telephonic visit.    Interactive audio and video telecommunications were attempted between this provider and patient, however failed, due to patient having technical difficulties OR patient did not have access to video capability.  We continued and completed visit with audio only.     Vital signs may be patient reported or missing.  Subjective:   Brooke Brown is a 70 y.o. female who presents for Medicare Annual (Subsequent) preventive examination.  Review of Systems     Cardiac Risk Factors include: advanced age (>26mn, >>49women);diabetes mellitus;hypertension     Objective:    Today's Vitals   08/29/20 1526  Weight: 156 lb (70.8 kg)  Height: 5' 5"  (1.651 m)   Body mass index is 25.96 kg/m.  Advanced Directives 08/29/2020 08/10/2019 08/03/2018 08/09/2017  Does Patient Have a Medical Advance Directive? Yes No Yes Yes  Type of Advance Directive Living will - Living will Living will  Does patient want to make changes to medical advance directive? - - - No - Patient declined  Would patient like information on creating a medical advance directive? - No - Patient declined - -    Current Medications (verified) Outpatient Encounter Medications as of 08/29/2020  Medication Sig  . acetaminophen (TYLENOL) 500 MG tablet Take 500 mg by mouth daily as needed for moderate pain or  headache.  .Marland KitchenamLODipine (NORVASC) 5 MG tablet Take 1 tablet by mouth once daily  . aspirin EC 81 MG tablet Take 81 mg by mouth daily.  . Blood Glucose Monitoring Suppl (ONE TOUCH ULTRA 2) w/Device KIT Use to check blood sugar 3 times a day. Dx code e11.65  . calcium carbonate (OSCAL) 1500 (600 Ca) MG TABS tablet Take 600 mg of elemental calcium by mouth daily.  . Carboxymethylcellul-Glycerin (LUBRICATING EYE DROPS OP) Place 1 drop into both eyes 2 (two) times daily.  . Cholecalciferol (VITAMIN D3) 125 MCG (5000 UT) CAPS Take 1 capsule (5,000 Units total) by mouth daily. Do not take on saturday and Sunday.  . hydrochlorothiazide (HYDRODIURIL) 25 MG tablet Take 1 tablet by mouth once daily  . LIVALO 2 MG TABS Take 1 tablet by mouth once daily  . Magnesium 250 MG TABS otc  . metFORMIN (GLUCOPHAGE-XR) 500 MG 24 hr tablet Take 1 tablet (500 mg total) by mouth 2 (two) times daily.  . Multiple Vitamin (MULTIVITAMIN WITH MINERALS) TABS tablet Take 1 tablet by mouth daily.  .Marland Kitchenolmesartan (BENICAR) 40 MG tablet Take 1 tablet (40 mg total) by mouth daily.  .Glory RosebushDelica Lancets 371QMISC USE AS DIRECTED TO CHECK BLOOD SUGARS ONCE DAILY  . ONETOUCH ULTRA test strip USE AS INSTRUCTED TO CHECK BLOOD SUGARS ONE TIME PER DAY  . SYNTHROID 112 MCG tablet 1 tablet Mon- Sat, 1/2 pill Sun   No facility-administered encounter medications on file as of 08/29/2020.    Allergies (verified) Patient has no known allergies.   History: Past Medical History:  Diagnosis Date  .  Anxiety   . Arthritis   . Diabetes mellitus   . Grave's disease   . Grave's disease   . Hyperlipemia   . Hypertension    Past Surgical History:  Procedure Laterality Date  . ABDOMINAL HYSTERECTOMY    . CATARACT EXTRACTION W/ INTRAOCULAR LENS  IMPLANT, BILATERAL Bilateral   . CHOLECYSTECTOMY N/A 08/16/2017   Procedure: LAPAROSCOPIC CHOLECYSTECTOMY;  Surgeon: Judeth Horn, MD;  Location: [facility];  Service: General;  Laterality: N/A;  .  TUBAL LIGATION     Family History  Problem Relation Age of Onset  . Pancreatic cancer Mother   . Heart failure Father   . Diabetes Brother   . Breast cancer Cousin    Social History   Socioeconomic History  . Marital status: Married    Spouse name: Not on file  . Number of children: Not on file  . Years of education: Not on file  . Highest education level: Not on file  Occupational History  . Occupation: retired  Tobacco Use  . Smoking status: Never Smoker  . Smokeless tobacco: Never Used  Vaping Use  . Vaping Use: Never used  Substance and Sexual Activity  . Alcohol use: Not Currently    Alcohol/week: 0.0 standard drinks    Comment: rarely  . Drug use: No  . Sexual activity: Not Currently  Other Topics Concern  . Not on file  Social History Narrative  . Not on file   Social Determinants of Health   Financial Resource Strain: Low Risk   . Difficulty of Paying Living Expenses: Not hard at all  Food Insecurity: No Food Insecurity  . Worried About Charity fundraiser in the Last Year: Never true  . Ran Out of Food in the Last Year: Never true  Transportation Needs: No Transportation Needs  . Lack of Transportation (Medical): No  . Lack of Transportation (Non-Medical): No  Physical Activity: Sufficiently Active  . Days of Exercise per Week: 3 days  . Minutes of Exercise per Session: 60 min  Stress: No Stress Concern Present  . Feeling of Stress : Not at all  Social Connections: Not on file    Tobacco Counseling Counseling given: Not Answered   Clinical Intake:  Pre-visit preparation completed: Yes  Pain : No/denies pain     Nutritional Status: BMI 25 -29 Overweight Nutritional Risks: None Diabetes: Yes  How often do you need to have someone help you when you read instructions, pamphlets, or other written materials from your doctor or pharmacy?: 1 - Never What is the last grade level you completed in school?: 39yrcollege  Diabetic? Yes Nutrition  Risk Assessment:  Has the patient had any N/V/D within the last 2 months?  No  Does the patient have any non-healing wounds?  No  Has the patient had any unintentional weight loss or weight gain?  No   Diabetes:  Is the patient diabetic?  Yes  If diabetic, was a CBG obtained today?  No  Did the patient bring in their glucometer from home?  No  How often do you monitor your CBG's? 3 times weekly.   Financial Strains and Diabetes Management:  Are you having any financial strains with the device, your supplies or your medication? No .  Does the patient want to be seen by Chronic Care Management for management of their diabetes?  No  Would the patient like to be referred to a Nutritionist or for Diabetic Management?  No   Diabetic Exams:  Diabetic Eye Exam: Completed 06/26/2020 Diabetic Foot Exam: Completed 12/26/2019   Interpreter Needed?: No  Information entered by :: NAllen LPN   Activities of Daily Living In your present state of health, do you have any difficulty performing the following activities: 08/29/2020 08/15/2020  Hearing? N N  Vision? N N  Difficulty concentrating or making decisions? N N  Walking or climbing stairs? N N  Dressing or bathing? N N  Doing errands, shopping? N N  Preparing Food and eating ? N -  Using the Toilet? N -  In the past six months, have you accidently leaked urine? N -  Do you have problems with loss of bowel control? N -  Managing your Medications? N -  Managing your Finances? N -  Some recent data might be hidden    Patient Care Team: Glendale Chard, MD as PCP - General (Internal Medicine)  Indicate any recent Medical Services you may have received from other than [facility] providers in the past year (date may be approximate).     Assessment:   This is a routine wellness examination for [place].  Hearing/Vision screen No exam data present  Dietary issues and exercise activities discussed: Current Exercise Habits: Home exercise  routine, Type of exercise: walking, Time (Minutes): 60, Frequency (Times/Week): 3, Weekly Exercise (Minutes/Week): 180  Goals    .  Patient Stated (pt-stated)      Wants to be able to get off diabetes medicine    .  Patient Stated      08/10/2019, wants to continue walking and lose 5 pounds    .  Patient Stated      08/29/2020, wants to get off cholesterol and diabetes medication      Depression Screen PHQ 2/9 Scores 08/29/2020 12/25/2019 08/10/2019 03/07/2019 12/19/2018 08/03/2018 08/03/2018  PHQ - 2 Score 0 0 0 0 0 0 0  PHQ- 9 Score - - 2 - - - -    Fall Risk Fall Risk  08/29/2020 08/15/2020 08/10/2019 03/07/2019 12/19/2018  Falls in the past year? 0 0 0 0 0  Number falls in past yr: - - - - -  Injury with Fall? - - - - -  Risk for fall due to : Medication side effect - Medication side effect - -  Follow up Falls evaluation completed;Education provided;Falls prevention discussed - Falls evaluation completed;Education provided;Falls prevention discussed - -    FALL RISK PREVENTION PERTAINING TO THE HOME:  Any stairs in or around the home? Yes  If so, are there any without handrails? No  Home free of loose throw rugs in walkways, pet beds, electrical cords, etc? Yes  Adequate lighting in your home to reduce risk of falls? Yes   ASSISTIVE DEVICES UTILIZED TO PREVENT FALLS:  Life alert? No  Use of a cane, walker or w/c? No  Grab bars in the bathroom? Yes  Shower chair or bench in shower? Yes  Elevated toilet seat or a handicapped toilet? Yes   TIMED UP AND GO:  Was the test performed? No .      Cognitive Function:     6CIT Screen 08/29/2020 08/10/2019 08/03/2018  What Year? 0 points 0 points 0 points  What month? 0 points 0 points 0 points  What time? 0 points 0 points 0 points  Count back from 20 0 points 0 points 0 points  Months in reverse 0 points 0 points 0 points  Repeat phrase 0 points 2 points 0 points  Total Score 0  2 0    Immunizations Immunization History   Administered Date(s) Administered  . Fluad Quad(high Dose 65+) 02/13/2020  . Influenza, High Dose Seasonal PF 03/10/2018, 01/31/2019  . PFIZER(Purple Top)SARS-COV-2 Vaccination 06/16/2019, 07/07/2019, 03/06/2020  . Pneumococcal Conjugate-13 08/04/2018    TDAP status: Up to date  Flu Vaccine status: Up to date  Pneumococcal vaccine status: Up to date  Covid-19 vaccine status: Completed vaccines  Qualifies for Shingles Vaccine? Yes   Zostavax completed No   Shingrix Completed?: No.    Education has been provided regarding the importance of this vaccine. Patient has been advised to call insurance company to determine out of pocket expense if they have not yet received this vaccine. Advised may also receive vaccine at local pharmacy or Health Dept. Verbalized acceptance and understanding.  Screening Tests Health Maintenance  Topic Date Due  . INFLUENZA VACCINE  12/23/2020  . FOOT EXAM  12/25/2020  . HEMOGLOBIN A1C  02/15/2021  . OPHTHALMOLOGY EXAM  06/26/2021  . TETANUS/TDAP  01/20/2022  . MAMMOGRAM  02/19/2022  . COLONOSCOPY (Pts 45-4yr Insurance coverage will need to be confirmed)  11/08/2028  . DEXA SCAN  Completed  . COVID-19 Vaccine  Completed  . Hepatitis C Screening  Completed  . PNA vac Low Risk Adult  Completed  . HPV VACCINES  Aged Out    Health Maintenance  There are no preventive care reminders to display for this patient.  Colorectal cancer screening: Type of screening: Colonoscopy. Completed 11/09/2018. Repeat every 10 years  Mammogram status: Completed 02/20/2020. Repeat every year  Bone Density status: Completed 02/09/2018. Results reflect: Bone density results: NORMAL. Repeat every 0 years.  Lung Cancer Screening: (Low Dose CT Chest recommended if Age 72-80years, 30 pack-year currently smoking OR have quit w/in 15years.) does not qualify.   Lung Cancer Screening Referral: no  Additional Screening:  Hepatitis C Screening: does qualify; Completed  01/15/2012  Vision Screening: Recommended annual ophthalmology exams for early detection of glaucoma and other disorders of the eye. Is the patient up to date with their annual eye exam?  Yes  Who is the provider or what is the name of the office in which the patient attends annual eye exams? Dr. GKaty FitchIf pt is not established with a provider, would they like to be referred to a provider to establish care? No .   Dental Screening: Recommended annual dental exams for proper oral hygiene  Community Resource Referral / Chronic Care Management: CRR required this visit?  No   CCM required this visit?  No      Plan:     I have personally reviewed and noted the following in the patient's chart:   . Medical and social history . Use of alcohol, tobacco or illicit drugs  . Current medications and supplements . Functional ability and status . Nutritional status . Physical activity . Advanced directives . List of other physicians . Hospitalizations, surgeries, and ER visits in previous 12 months . Vitals . Screenings to include cognitive, depression, and falls . Referrals and appointments  In addition, I have reviewed and discussed with patient certain preventive protocols, quality metrics, and best practice recommendations. A written personalized care plan for preventive services as well as general preventive health recommendations were provided to patient.     NKellie Simmering LPN   [id number]  Nurse Notes:

## 2020-08-29 NOTE — Patient Instructions (Signed)
Brooke Brown , Thank you for taking time to come for your Medicare Wellness Visit. I appreciate your ongoing commitment to your health goals. Please review the following plan we discussed and let me know if I can assist you in the future.   Screening recommendations/referrals: Colonoscopy: completed 11/09/2018 Mammogram: completed 02/20/2020 Bone Density: completed 02/09/2018 Recommended yearly ophthalmology/optometry visit for glaucoma screening and checkup Recommended yearly dental visit for hygiene and checkup  Vaccinations: Influenza vaccine: completed 02/13/2020, due 12/23/2020 Pneumococcal vaccine: completed 08/16/2020 Tdap vaccine: completed 01/21/2012, due 01/20/2022 Shingles vaccine: discussed   Covid-19: 03/06/2020, 07/07/2019, 06/16/2019  Advanced directives: Please bring a copy of your POA (Power of Attorney) and/or Living Will to your next appointment.   Conditions/risks identified: none  Next appointment: Follow up in one year for your annual wellness visit    Preventive Care 65 Years and Older, Female Preventive care refers to lifestyle choices and visits with your health care provider that can promote health and wellness. What does preventive care include?  A yearly physical exam. This is also called an annual well check.  Dental exams once or twice a year.  Routine eye exams. Ask your health care provider how often you should have your eyes checked.  Personal lifestyle choices, including:  Daily care of your teeth and gums.  Regular physical activity.  Eating a healthy diet.  Avoiding tobacco and drug use.  Limiting alcohol use.  Practicing safe sex.  Taking low-dose aspirin every day.  Taking vitamin and mineral supplements as recommended by your health care provider. What happens during an annual well check? The services and screenings done by your health care provider during your annual well check will depend on your age, overall health, lifestyle risk  factors, and family history of disease. Counseling  Your health care provider may ask you questions about your:  Alcohol use.  Tobacco use.  Drug use.  Emotional well-being.  Home and relationship well-being.  Sexual activity.  Eating habits.  History of falls.  Memory and ability to understand (cognition).  Work and work Statistician.  Reproductive health. Screening  You may have the following tests or measurements:  Height, weight, and BMI.  Blood pressure.  Lipid and cholesterol levels. These may be checked every 5 years, or more frequently if you are over 22 years old.  Skin check.  Lung cancer screening. You may have this screening every year starting at age 15 if you have a 30-pack-year history of smoking and currently smoke or have quit within the past 15 years.  Fecal occult blood test (FOBT) of the stool. You may have this test every year starting at age 66.  Flexible sigmoidoscopy or colonoscopy. You may have a sigmoidoscopy every 5 years or a colonoscopy every 10 years starting at age 47.  Hepatitis C blood test.  Hepatitis B blood test.  Sexually transmitted disease (STD) testing.  Diabetes screening. This is done by checking your blood sugar (glucose) after you have not eaten for a while (fasting). You may have this done every 1-3 years.  Bone density scan. This is done to screen for osteoporosis. You may have this done starting at age 12.  Mammogram. This may be done every 1-2 years. Talk to your health care provider about how often you should have regular mammograms. Talk with your health care provider about your test results, treatment options, and if necessary, the need for more tests. Vaccines  Your health care provider may recommend certain vaccines, such as:  Influenza  vaccine. This is recommended every year.  Tetanus, diphtheria, and acellular pertussis (Tdap, Td) vaccine. You may need a Td booster every 10 years.  Zoster vaccine. You  may need this after age 2.  Pneumococcal 13-valent conjugate (PCV13) vaccine. One dose is recommended after age 49.  Pneumococcal polysaccharide (PPSV23) vaccine. One dose is recommended after age 29. Talk to your health care provider about which screenings and vaccines you need and how often you need them. This information is not intended to replace advice given to you by your health care provider. Make sure you discuss any questions you have with your health care provider. Document Released: 06/07/2015 Document Revised: 01/29/2016 Document Reviewed: 03/12/2015 Elsevier Interactive Patient Education  2017 [place] Prevention in the Home Falls can cause injuries. They can happen to people of all ages. There are many things you can do to make your home safe and to help prevent falls. What can I do on the outside of my home?  Regularly fix the edges of walkways and driveways and fix any cracks.  Remove anything that might make you trip as you walk through a door, such as a raised step or threshold.  Trim any bushes or trees on the path to your home.  Use bright outdoor lighting.  Clear any walking paths of anything that might make someone trip, such as rocks or tools.  Regularly check to see if handrails are loose or broken. Make sure that both sides of any steps have handrails.  Any raised decks and porches should have guardrails on the edges.  Have any leaves, snow, or ice cleared regularly.  Use sand or salt on walking paths during winter.  Clean up any spills in your garage right away. This includes oil or grease spills. What can I do in the bathroom?  Use night lights.  Install grab bars by the toilet and in the tub and shower. Do not use towel bars as grab bars.  Use non-skid mats or decals in the tub or shower.  If you need to sit down in the shower, use a plastic, non-slip stool.  Keep the floor dry. Clean up any water that spills on the floor as soon as  it happens.  Remove soap buildup in the tub or shower regularly.  Attach bath mats securely with double-sided non-slip rug tape.  Do not have throw rugs and other things on the floor that can make you trip. What can I do in the bedroom?  Use night lights.  Make sure that you have a light by your bed that is easy to reach.  Do not use any sheets or blankets that are too big for your bed. They should not hang down onto the floor.  Have a firm chair that has side arms. You can use this for support while you get dressed.  Do not have throw rugs and other things on the floor that can make you trip. What can I do in the kitchen?  Clean up any spills right away.  Avoid walking on wet floors.  Keep items that you use a lot in easy-to-reach places.  If you need to reach something above you, use a strong step stool that has a grab bar.  Keep electrical cords out of the way.  Do not use floor polish or wax that makes floors slippery. If you must use wax, use non-skid floor wax.  Do not have throw rugs and other things on the floor that can  make you trip. What can I do with my stairs?  Do not leave any items on the stairs.  Make sure that there are handrails on both sides of the stairs and use them. Fix handrails that are broken or loose. Make sure that handrails are as long as the stairways.  Check any carpeting to make sure that it is firmly attached to the stairs. Fix any carpet that is loose or worn.  Avoid having throw rugs at the top or bottom of the stairs. If you do have throw rugs, attach them to the floor with carpet tape.  Make sure that you have a light switch at the top of the stairs and the bottom of the stairs. If you do not have them, ask someone to add them for you. What else can I do to help prevent falls?  Wear shoes that:  Do not have high heels.  Have rubber bottoms.  Are comfortable and fit you well.  Are closed at the toe. Do not wear sandals.  If  you use a stepladder:  Make sure that it is fully opened. Do not climb a closed stepladder.  Make sure that both sides of the stepladder are locked into place.  Ask someone to hold it for you, if possible.  Clearly mark and make sure that you can see:  Any grab bars or handrails.  First and last steps.  Where the edge of each step is.  Use tools that help you move around (mobility aids) if they are needed. These include:  Canes.  Walkers.  Scooters.  Crutches.  Turn on the lights when you go into a dark area. Replace any light bulbs as soon as they burn out.  Set up your furniture so you have a clear path. Avoid moving your furniture around.  If any of your floors are uneven, fix them.  If there are any pets around you, be aware of where they are.  Review your medicines with your doctor. Some medicines can make you feel dizzy. This can increase your chance of falling. Ask your doctor what other things that you can do to help prevent falls. This information is not intended to replace advice given to you by your health care provider. Make sure you discuss any questions you have with your health care provider. Document Released: 03/07/2009 Document Revised: 10/17/2015 Document Reviewed: 06/15/2014 Elsevier Interactive Patient Education  2017 [employer].

## 2020-09-03 DIAGNOSIS — M4722 Other spondylosis with radiculopathy, cervical region: Secondary | ICD-10-CM | POA: Diagnosis not present

## 2020-09-03 DIAGNOSIS — Z6826 Body mass index (BMI) 26.0-26.9, adult: Secondary | ICD-10-CM | POA: Diagnosis not present

## 2020-09-03 DIAGNOSIS — R030 Elevated blood-pressure reading, without diagnosis of hypertension: Secondary | ICD-10-CM | POA: Diagnosis not present

## 2020-09-12 DIAGNOSIS — M542 Cervicalgia: Secondary | ICD-10-CM | POA: Diagnosis not present

## 2020-09-12 DIAGNOSIS — M4722 Other spondylosis with radiculopathy, cervical region: Secondary | ICD-10-CM | POA: Diagnosis not present

## 2020-09-12 DIAGNOSIS — M5412 Radiculopathy, cervical region: Secondary | ICD-10-CM | POA: Diagnosis not present

## 2020-09-17 DIAGNOSIS — M4722 Other spondylosis with radiculopathy, cervical region: Secondary | ICD-10-CM | POA: Diagnosis not present

## 2020-09-17 DIAGNOSIS — M5412 Radiculopathy, cervical region: Secondary | ICD-10-CM | POA: Diagnosis not present

## 2020-09-17 DIAGNOSIS — M542 Cervicalgia: Secondary | ICD-10-CM | POA: Diagnosis not present

## 2020-09-18 ENCOUNTER — Other Ambulatory Visit: Payer: Self-pay | Admitting: Internal Medicine

## 2020-09-19 DIAGNOSIS — M542 Cervicalgia: Secondary | ICD-10-CM | POA: Diagnosis not present

## 2020-09-19 DIAGNOSIS — M4722 Other spondylosis with radiculopathy, cervical region: Secondary | ICD-10-CM | POA: Diagnosis not present

## 2020-09-19 DIAGNOSIS — M5412 Radiculopathy, cervical region: Secondary | ICD-10-CM | POA: Diagnosis not present

## 2020-09-26 DIAGNOSIS — M542 Cervicalgia: Secondary | ICD-10-CM | POA: Diagnosis not present

## 2020-09-26 DIAGNOSIS — M5412 Radiculopathy, cervical region: Secondary | ICD-10-CM | POA: Diagnosis not present

## 2020-09-26 DIAGNOSIS — M4722 Other spondylosis with radiculopathy, cervical region: Secondary | ICD-10-CM | POA: Diagnosis not present

## 2020-10-02 DIAGNOSIS — M542 Cervicalgia: Secondary | ICD-10-CM | POA: Diagnosis not present

## 2020-10-02 DIAGNOSIS — M4722 Other spondylosis with radiculopathy, cervical region: Secondary | ICD-10-CM | POA: Diagnosis not present

## 2020-10-02 DIAGNOSIS — M5412 Radiculopathy, cervical region: Secondary | ICD-10-CM | POA: Diagnosis not present

## 2020-10-10 DIAGNOSIS — M542 Cervicalgia: Secondary | ICD-10-CM | POA: Diagnosis not present

## 2020-10-10 DIAGNOSIS — M5412 Radiculopathy, cervical region: Secondary | ICD-10-CM | POA: Diagnosis not present

## 2020-10-10 DIAGNOSIS — M4722 Other spondylosis with radiculopathy, cervical region: Secondary | ICD-10-CM | POA: Diagnosis not present

## 2020-10-17 DIAGNOSIS — M4722 Other spondylosis with radiculopathy, cervical region: Secondary | ICD-10-CM | POA: Diagnosis not present

## 2020-10-17 DIAGNOSIS — M542 Cervicalgia: Secondary | ICD-10-CM | POA: Diagnosis not present

## 2020-10-17 DIAGNOSIS — M5412 Radiculopathy, cervical region: Secondary | ICD-10-CM | POA: Diagnosis not present

## 2020-10-24 DIAGNOSIS — M5412 Radiculopathy, cervical region: Secondary | ICD-10-CM | POA: Diagnosis not present

## 2020-10-24 DIAGNOSIS — M542 Cervicalgia: Secondary | ICD-10-CM | POA: Diagnosis not present

## 2020-10-24 DIAGNOSIS — M4722 Other spondylosis with radiculopathy, cervical region: Secondary | ICD-10-CM | POA: Diagnosis not present

## 2020-10-31 ENCOUNTER — Other Ambulatory Visit: Payer: Self-pay

## 2020-10-31 ENCOUNTER — Ambulatory Visit (INDEPENDENT_AMBULATORY_CARE_PROVIDER_SITE_OTHER): Payer: PPO

## 2020-10-31 ENCOUNTER — Ambulatory Visit: Payer: PPO | Admitting: Podiatry

## 2020-10-31 DIAGNOSIS — M7661 Achilles tendinitis, right leg: Secondary | ICD-10-CM

## 2020-10-31 DIAGNOSIS — M542 Cervicalgia: Secondary | ICD-10-CM | POA: Diagnosis not present

## 2020-10-31 DIAGNOSIS — M216X1 Other acquired deformities of right foot: Secondary | ICD-10-CM | POA: Diagnosis not present

## 2020-10-31 DIAGNOSIS — M5412 Radiculopathy, cervical region: Secondary | ICD-10-CM | POA: Diagnosis not present

## 2020-10-31 DIAGNOSIS — M4722 Other spondylosis with radiculopathy, cervical region: Secondary | ICD-10-CM | POA: Diagnosis not present

## 2020-10-31 DIAGNOSIS — M21861 Other specified acquired deformities of right lower leg: Secondary | ICD-10-CM

## 2020-10-31 NOTE — Patient Instructions (Signed)
For instructions on how to put on your Night Splint, please visit [email or web address]   Plantar Fasciitis (Heel Spur Syndrome) with Rehab The plantar fascia is a fibrous, ligament-like, soft-tissue structure that spans the bottom of the foot. Plantar fasciitis is a condition that causes pain in the foot due to inflammation of the tissue. SYMPTOMS   Pain and tenderness on the underneath side of the foot.  Pain that worsens with standing or walking. CAUSES  Plantar fasciitis is caused by irritation and injury to the plantar fascia on the underneath side of the foot. Common mechanisms of injury include:  Direct trauma to bottom of the foot.  Damage to a small nerve that runs under the foot where the main fascia attaches to the heel bone.  Stress placed on the plantar fascia due to bone spurs. RISK INCREASES WITH:   Activities that place stress on the plantar fascia (running, jumping, pivoting, or cutting).  Poor strength and flexibility.  Improperly fitted shoes.  Tight calf muscles.  Flat feet.  Failure to warm-up properly before activity.  Obesity. PREVENTION  Warm up and stretch properly before activity.  Allow for adequate recovery between workouts.  Maintain physical fitness:  Strength, flexibility, and endurance.  Cardiovascular fitness.  Maintain a health body weight.  Avoid stress on the plantar fascia.  Wear properly fitted shoes, including arch supports for individuals who have flat feet.  PROGNOSIS  If treated properly, then the symptoms of plantar fasciitis usually resolve without surgery. However, occasionally surgery is necessary.  RELATED COMPLICATIONS   Recurrent symptoms that may result in a chronic condition.  Problems of the lower back that are caused by compensating for the injury, such as limping.  Pain or weakness of the foot during push-off following surgery.  Chronic inflammation, scarring, and partial or complete fascia tear,  occurring more often from repeated injections.  TREATMENT  Treatment initially involves the use of ice and medication to help reduce pain and inflammation. The use of strengthening and stretching exercises may help reduce pain with activity, especially stretches of the Achilles tendon. These exercises may be performed at home or with a therapist. Your caregiver may recommend that you use heel cups of arch supports to help reduce stress on the plantar fascia. Occasionally, corticosteroid injections are given to reduce inflammation. If symptoms persist for greater than 6 months despite non-surgical (conservative), then surgery may be recommended.   MEDICATION   If pain medication is necessary, then nonsteroidal anti-inflammatory medications, such as aspirin and ibuprofen, or other minor pain relievers, such as acetaminophen, are often recommended.  Do not take pain medication within 7 days before surgery.  Prescription pain relievers may be given if deemed necessary by your caregiver. Use only as directed and only as much as you need.  Corticosteroid injections may be given by your caregiver. These injections should be reserved for the most serious cases, because they may only be given a certain number of times.  HEAT AND COLD  Cold treatment (icing) relieves pain and reduces inflammation. Cold treatment should be applied for 10 to 15 minutes every 2 to 3 hours for inflammation and pain and immediately after any activity that aggravates your symptoms. Use ice packs or massage the area with a piece of ice (ice massage).  Heat treatment may be used prior to performing the stretching and strengthening activities prescribed by your caregiver, physical therapist, or athletic trainer. Use a heat pack or soak the injury in warm water.  SEEK IMMEDIATE MEDICAL CARE   IF:  Treatment seems to offer no benefit, or the condition worsens.  Any medications produce adverse side effects.  EXERCISES- RANGE OF  MOTION (ROM) AND STRETCHING EXERCISES - Plantar Fasciitis (Heel Spur Syndrome) These exercises may help you when beginning to rehabilitate your injury. Your symptoms may resolve with or without further involvement from your physician, physical therapist or athletic trainer. While completing these exercises, remember:   Restoring tissue flexibility helps normal motion to return to the joints. This allows healthier, less painful movement and activity.  An effective stretch should be held for at least 30 seconds.  A stretch should never be painful. You should only feel a gentle lengthening or release in the stretched tissue.  RANGE OF MOTION - Toe Extension, Flexion  Sit with your right / left leg crossed over your opposite knee.  Grasp your toes and gently pull them back toward the top of your foot. You should feel a stretch on the bottom of your toes and/or foot.  Hold this stretch for 10 seconds.  Now, gently pull your toes toward the bottom of your foot. You should feel a stretch on the top of your toes and or foot.  Hold this stretch for 10 seconds. Repeat  times. Complete this stretch 3 times per day.   RANGE OF MOTION - Ankle Dorsiflexion, Active Assisted  Remove shoes and sit on a chair that is preferably not on a carpeted surface.  Place right / left foot under knee. Extend your opposite leg for support.  Keeping your heel down, slide your right / left foot back toward the chair until you feel a stretch at your ankle or calf. If you do not feel a stretch, slide your bottom forward to the edge of the chair, while still keeping your heel down.  Hold this stretch for 10 seconds. Repeat 3 times. Complete this stretch 2 times per day.   STRETCH  Gastroc, Standing  Place hands on wall.  Extend right / left leg, keeping the front knee somewhat bent.  Slightly point your toes inward on your back foot.  Keeping your right / left heel on the floor and your knee straight, shift  your weight toward the wall, not allowing your back to arch.  You should feel a gentle stretch in the right / left calf. Hold this position for 10 seconds. Repeat 3 times. Complete this stretch 2 times per day.  STRETCH  Soleus, Standing  Place hands on wall.  Extend right / left leg, keeping the other knee somewhat bent.  Slightly point your toes inward on your back foot.  Keep your right / left heel on the floor, bend your back knee, and slightly shift your weight over the back leg so that you feel a gentle stretch deep in your back calf.  Hold this position for 10 seconds. Repeat 3 times. Complete this stretch 2 times per day.  STRETCH  Gastrocsoleus, Standing  Note: This exercise can place a lot of stress on your foot and ankle. Please complete this exercise only if specifically instructed by your caregiver.   Place the ball of your right / left foot on a step, keeping your other foot firmly on the same step.  Hold on to the wall or a rail for balance.  Slowly lift your other foot, allowing your body weight to press your heel down over the edge of the step.  You should feel a stretch in your right / left calf.  Hold this position   for 10 seconds.  Repeat this exercise with a slight bend in your right / left knee. Repeat 3 times. Complete this stretch 2 times per day.   STRENGTHENING EXERCISES - Plantar Fasciitis (Heel Spur Syndrome)  These exercises may help you when beginning to rehabilitate your injury. They may resolve your symptoms with or without further involvement from your physician, physical therapist or athletic trainer. While completing these exercises, remember:   Muscles can gain both the endurance and the strength needed for everyday activities through controlled exercises.  Complete these exercises as instructed by your physician, physical therapist or athletic trainer. Progress the resistance and repetitions only as guided.  STRENGTH - Towel Curls  Sit in  a chair positioned on a non-carpeted surface.  Place your foot on a towel, keeping your heel on the floor.  Pull the towel toward your heel by only curling your toes. Keep your heel on the floor. Repeat 3 times. Complete this exercise 2 times per day.  STRENGTH - Ankle Inversion  Secure one end of a rubber exercise band/tubing to a fixed object (table, pole). Loop the other end around your foot just before your toes.  Place your fists between your knees. This will focus your strengthening at your ankle.  Slowly, pull your big toe up and in, making sure the band/tubing is positioned to resist the entire motion.  Hold this position for 10 seconds.  Have your muscles resist the band/tubing as it slowly pulls your foot back to the starting position. Repeat 3 times. Complete this exercises 2 times per day.  Document Released: 05/11/2005 Document Revised: 08/03/2011 Document Reviewed: 08/23/2008 [employer] Patient Information 2014 [employer].  

## 2020-11-05 DIAGNOSIS — M4722 Other spondylosis with radiculopathy, cervical region: Secondary | ICD-10-CM | POA: Diagnosis not present

## 2020-11-05 DIAGNOSIS — Z6826 Body mass index (BMI) 26.0-26.9, adult: Secondary | ICD-10-CM | POA: Diagnosis not present

## 2020-11-05 DIAGNOSIS — I10 Essential (primary) hypertension: Secondary | ICD-10-CM | POA: Diagnosis not present

## 2020-11-06 ENCOUNTER — Other Ambulatory Visit: Payer: Self-pay | Admitting: Internal Medicine

## 2020-11-06 NOTE — Progress Notes (Signed)
Subjective: 70 year old female presents the office today for evaluation of right ankle discomfort.  She states that she gets discomfort pointing more to the Achilles tendon area.  She gets soreness to the area and this is been intermittent since October when I last saw her.  No recent injury or falls.  She likes to walk 1 hour 3 times a week.  She has not seen any swelling or no injury.  Denies any systemic complaints such as fevers, chills, nausea, vomiting. No acute changes since last appointment, and no other complaints at this time.   Objective: AAO x3, NAD DP/PT pulses palpable bilaterally, CRT less than 3 seconds There is no discomfort still present on the mid septum be placed on the right side.  There is no evidence of rupture deficit noted in the Achilles tendon and Thompson test is negative.  Equinus is present.  There is no pain along the posterior calcaneus.  There is no other areas of pinpoint tenderness.  MMT 5/5. No pain with calf compression, swelling, warmth, erythema  Assessment: Achilles tendinitis  Plan: -All treatment options discussed with the patient including all alternatives, risks, complications.  -X-rays obtained reviewed.  There is no evidence of acute fracture.  Posterior calcaneal spurring is evident. -Voltaren gel -Night splint dispensed -Discussed stretching/PT, icing daily. -Shoe modifications and good arch support -If symptoms continue MRI -Patient encouraged to call the office with any questions, concerns, change in symptoms.   Trula Slade DPM

## 2020-11-08 MED ORDER — SYNTHROID 112 MCG PO TABS: ORAL_TABLET | ORAL | 0 refills | Status: DC

## 2020-12-15 ENCOUNTER — Other Ambulatory Visit: Payer: Self-pay | Admitting: Internal Medicine

## 2020-12-23 ENCOUNTER — Other Ambulatory Visit: Payer: Self-pay | Admitting: Internal Medicine

## 2020-12-31 ENCOUNTER — Other Ambulatory Visit: Payer: Self-pay

## 2020-12-31 ENCOUNTER — Encounter: Payer: Self-pay | Admitting: Internal Medicine

## 2020-12-31 ENCOUNTER — Ambulatory Visit (INDEPENDENT_AMBULATORY_CARE_PROVIDER_SITE_OTHER): Payer: PPO | Admitting: Internal Medicine

## 2020-12-31 VITALS — BP 130/68 | HR 78 | Temp 98.0°F | Ht 64.6 in | Wt 157.0 lb

## 2020-12-31 DIAGNOSIS — Z0000 Encounter for general adult medical examination without abnormal findings: Secondary | ICD-10-CM | POA: Diagnosis not present

## 2020-12-31 DIAGNOSIS — I700 Atherosclerosis of aorta: Secondary | ICD-10-CM

## 2020-12-31 DIAGNOSIS — N182 Chronic kidney disease, stage 2 (mild): Secondary | ICD-10-CM

## 2020-12-31 DIAGNOSIS — I129 Hypertensive chronic kidney disease with stage 1 through stage 4 chronic kidney disease, or unspecified chronic kidney disease: Secondary | ICD-10-CM

## 2020-12-31 DIAGNOSIS — Z23 Encounter for immunization: Secondary | ICD-10-CM

## 2020-12-31 DIAGNOSIS — M79642 Pain in left hand: Secondary | ICD-10-CM

## 2020-12-31 DIAGNOSIS — H6123 Impacted cerumen, bilateral: Secondary | ICD-10-CM | POA: Diagnosis not present

## 2020-12-31 DIAGNOSIS — E663 Overweight: Secondary | ICD-10-CM | POA: Diagnosis not present

## 2020-12-31 DIAGNOSIS — E559 Vitamin D deficiency, unspecified: Secondary | ICD-10-CM | POA: Diagnosis not present

## 2020-12-31 DIAGNOSIS — E890 Postprocedural hypothyroidism: Secondary | ICD-10-CM

## 2020-12-31 DIAGNOSIS — E1122 Type 2 diabetes mellitus with diabetic chronic kidney disease: Secondary | ICD-10-CM | POA: Diagnosis not present

## 2020-12-31 LAB — POCT URINALYSIS DIPSTICK
Bilirubin, UA: NEGATIVE
Blood, UA: NEGATIVE
Glucose, UA: NEGATIVE
Ketones, UA: NEGATIVE
Leukocytes, UA: NEGATIVE
Nitrite, UA: NEGATIVE
Protein, UA: NEGATIVE
Spec Grav, UA: 1.025 (ref 1.010–1.025)
Urobilinogen, UA: 0.2 E.U./dL
pH, UA: 5.5 (ref 5.0–8.0)

## 2020-12-31 LAB — POCT UA - MICROALBUMIN
Albumin/Creatinine Ratio, Urine, POC: <30
Creatinine, POC: 300 mg/dL
Microalbumin Ur, POC: 30 mg/L

## 2020-12-31 MED ORDER — SHINGRIX 50 MCG/0.5ML IM SUSR: 0.5000 mL | Freq: Once | INTRAMUSCULAR | 0 refills | Status: AC

## 2020-12-31 NOTE — Patient Instructions (Signed)

## 2020-12-31 NOTE — Progress Notes (Signed)
I,Brooke Brown,acting as a Education administrator for Brooke Greenland, MD.,have documented all relevant documentation on the behalf of Brooke Greenland, MD,as directed by  Brooke Greenland, MD while in the presence of Brooke Greenland, MD.  This visit occurred during the SARS-CoV-2 public health emergency.  Safety protocols were in place, including screening questions prior to the visit, additional usage of staff PPE, and extensive cleaning of exam room while observing appropriate contact time as indicated for disinfecting solutions.  Subjective:     Patient ID: Brooke Brown , female    DOB: 03/26/1951 , 70 y.o.   MRN: [id number]   Chief Complaint  Patient presents with   Annual Exam   Diabetes   Hypertension    HPI  The patient is here today for a physical examination.  She was formerly followed by Dr. [name] for her GYN exams, but he recently retired. She is s/p hysterectomy. She reports compliance with meds.   Diabetes She presents for her follow-up diabetic visit. She has type 2 diabetes mellitus. Her disease course has been stable. There are no hypoglycemic associated symptoms. Pertinent negatives for diabetes include no blurred vision and no chest pain. There are no hypoglycemic complications. Diabetic complications include nephropathy. Risk factors for coronary artery disease include diabetes mellitus, dyslipidemia, hypertension, post-menopausal and sedentary lifestyle. Her weight is stable. She is following a diabetic diet. She participates in exercise three times a week. Her breakfast blood glucose is taken between 8-9 am. Her breakfast blood glucose range is generally 110-130 mg/dl. Eye exam is current.  Hypertension This is a chronic problem. The current episode started more than 1 year ago. The problem has been gradually improving since onset. The problem is controlled. Pertinent negatives include no blurred vision, chest pain, palpitations or shortness of breath. Past treatments  include calcium channel blockers, diuretics and angiotensin blockers. The current treatment provides moderate improvement. Hypertensive end-organ damage includes kidney disease.    Past Medical History:  Diagnosis Date   Anxiety    Arthritis    Diabetes mellitus    Grave's disease    Grave's disease    Hyperlipemia    Hypertension      Family History  Problem Relation Age of Onset   Pancreatic cancer Mother    Heart failure Father    Diabetes Brother    Breast cancer Cousin      Current Outpatient Medications:    acetaminophen (TYLENOL) 500 MG tablet, Take 500 mg by mouth daily as needed for moderate pain or headache., Disp: , Rfl:    amLODipine (NORVASC) 5 MG tablet, Take 1 tablet by mouth once daily, Disp: 90 tablet, Rfl: 0   aspirin EC 81 MG tablet, Take 81 mg by mouth daily., Disp: , Rfl:    Blood Glucose Monitoring Suppl (ONE TOUCH ULTRA 2) w/Device KIT, Use to check blood sugar 3 times a day. Dx code e11.65, Disp: 1 kit, Rfl: 2   calcium carbonate (OSCAL) 1500 (600 Ca) MG TABS tablet, Take 600 mg of elemental calcium by mouth daily., Disp: , Rfl:    Carboxymethylcellul-Glycerin (LUBRICATING EYE DROPS OP), Place 1 drop into both eyes 2 (two) times daily., Disp: , Rfl:    Cholecalciferol (VITAMIN D3) 125 MCG (5000 UT) CAPS, Take 1 capsule (5,000 Units total) by mouth daily. Do not take on saturday and Sunday., Disp: 30 capsule, Rfl: 0   hydrochlorothiazide (HYDRODIURIL) 25 MG tablet, Take 1 tablet by mouth once daily, Disp: 90 tablet, Rfl: 0  LIVALO 2 MG TABS, Take 1 tablet by mouth once daily, Disp: 90 tablet, Rfl: 0   Magnesium 250 MG TABS, otc, Disp:  , Rfl: 0   metFORMIN (GLUCOPHAGE-XR) 500 MG 24 hr tablet, Take 1 tablet by mouth twice daily, Disp: 180 tablet, Rfl: 0   Multiple Vitamin (MULTIVITAMIN WITH MINERALS) TABS tablet, Take 1 tablet by mouth daily., Disp: , Rfl:    olmesartan (BENICAR) 40 MG tablet, Take 1 tablet by mouth once daily, Disp: 90 tablet, Rfl: 0    OneTouch Delica Lancets 18A MISC, USE AS DIRECTED TO CHECK BLOOD SUGARS ONCE DAILY, Disp: 100 each, Rfl: 2   ONETOUCH ULTRA test strip, USE AS INSTRUCTED TO CHECK BLOOD SUGARS ONE TIME PER DAY, Disp: 100 each, Rfl: 2   SYNTHROID 112 MCG tablet, 1 tablet Mon- Sat, 1/2 pill Sun, Disp: 90 tablet, Rfl: 0   No Known Allergies    The patient states she uses nothing for birth control. Last LMP was No LMP recorded. Patient has had a hysterectomy.. Negative for Dysmenorrhea. Negative for: breast discharge, breast lump(s), breast pain and breast self exam. Associated symptoms include abnormal vaginal bleeding. Pertinent negatives include abnormal bleeding (hematology), anxiety, decreased libido, depression, difficulty falling sleep, dyspareunia, history of infertility, nocturia, sexual dysfunction, sleep disturbances, urinary incontinence, urinary urgency, vaginal discharge and vaginal itching. Diet regular.The patient states her exercise level is  moderate - she walks regularly.   . The patient's tobacco use is:  Social History   Tobacco Use  Smoking Status Never  Smokeless Tobacco Never  . She has been exposed to passive smoke. The patient's alcohol use is:  Social History   Substance and Sexual Activity  Alcohol Use Not Currently   Alcohol/week: 0.0 standard drinks   Comment: rarely   Review of Systems  Constitutional: Negative.   HENT: Negative.    Eyes: Negative.  Negative for blurred vision.  Respiratory: Negative.  Negative for shortness of breath.   Cardiovascular: Negative.  Negative for chest pain and palpitations.  Gastrointestinal: Negative.   Endocrine: Negative.   Genitourinary: Negative.   Musculoskeletal:  Positive for arthralgias.       She c/o left hand pain. 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 There is pain with movement. She has been eval by Ortho, advised that steroid injection would not be helpful. She would like further evaluation by hand  specialist.   Skin: Negative.   Allergic/Immunologic: Negative.   Neurological: Negative.   Hematological: Negative.   Psychiatric/Behavioral: Negative.      Today's Vitals   12/31/20 0911  BP: 130/68  Pulse: 78  Temp: 98 F (36.7 C)  TempSrc: Oral  Weight: 157 lb (71.2 kg)  Height: 5' 4.6" (1.641 m)   Body mass index is 26.45 kg/m.  Wt Readings from Last 3 Encounters:  12/31/20 157 lb (71.2 kg)  08/29/20 156 lb (70.8 kg)  08/15/20 158 lb 6.4 oz (71.8 kg)    Objective:  Physical Exam Vitals and nursing note reviewed.  Constitutional:      Appearance: Normal appearance.  HENT:     Head: Normocephalic and atraumatic.     Right Ear: Ear canal and external ear normal. There is impacted cerumen.     Left Ear: Ear canal and external ear normal. There is impacted cerumen.     Nose:     Comments: Masked     Mouth/Throat:     Comments: Masked  Eyes:     Extraocular  Movements: Extraocular movements intact.     Conjunctiva/sclera: Conjunctivae normal.     Pupils: Pupils are equal, round, and reactive to light.  Cardiovascular:     Rate and Rhythm: Normal rate and regular rhythm.     Pulses: Normal pulses.          Dorsalis pedis pulses are 2+ on the right side and 2+ on the left side.     Heart sounds: Normal heart sounds.  Pulmonary:     Effort: Pulmonary effort is normal.     Breath sounds: Normal breath sounds.  Chest:  Breasts:    Tanner Score is 5.     Right: Normal.     Left: Normal.  Abdominal:     General: Abdomen is flat. Bowel sounds are normal.     Palpations: Abdomen is soft.  Genitourinary:    Comments: deferred Musculoskeletal:        General: Normal range of motion.     Cervical back: Normal range of motion and neck supple.     Comments: Splint on left hand  Feet:     Right foot:     Protective Sensation: 5 sites tested.  5 sites sensed.     Skin integrity: Skin integrity normal.     Toenail Condition: Right toenails are normal.     Left foot:      Protective Sensation: 5 sites tested.  5 sites sensed.     Skin integrity: Skin integrity normal.     Toenail Condition: Left toenails are normal.  Skin:    General: Skin is warm and dry.  Neurological:     General: No focal deficit present.     Mental Status: She is alert and oriented to person, place, and time.  Psychiatric:        Mood and Affect: Mood normal.        Behavior: Behavior normal.        Assessment And Plan:     1. Routine general medical examination at a health care facility Comments: A full exam was performed. Importance of monthly self breast exams was discussed with the patient.  PATIENT IS ADVISED TO GET 30-45 MINUTES REGULAR EXERCISE NO LESS THAN FOUR TO FIVE DAYS PER WEEK - BOTH WEIGHTBEARING EXERCISES AND AEROBIC ARE RECOMMENDED.  PATIENT IS ADVISED TO FOLLOW A HEALTHY DIET WITH AT LEAST SIX FRUITS/VEGGIES PER DAY, DECREASE INTAKE OF RED MEAT, AND TO INCREASE FISH INTAKE TO TWO DAYS PER WEEK.  MEATS/FISH SHOULD NOT BE FRIED, BAKED OR BROILED IS PREFERABLE.  IT IS ALSO IMPORTANT TO CUT BACK ON YOUR SUGAR INTAKE. PLEASE AVOID ANYTHING WITH ADDED SUGAR, CORN SYRUP OR OTHER SWEETENERS. IF YOU MUST USE A SWEETENER, YOU CAN TRY STEVIA. IT IS ALSO IMPORTANT TO AVOID ARTIFICIALLY SWEETENERS AND DIET BEVERAGES. LASTLY, I SUGGEST WEARING SPF 50 SUNSCREEN ON EXPOSED PARTS AND ESPECIALLY WHEN IN THE DIRECT SUNLIGHT FOR AN EXTENDED PERIOD OF TIME.  PLEASE AVOID FAST FOOD RESTAURANTS AND INCREASE YOUR WATER INTAKE.    2. Type 2 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 disease, without long-term current use of insulin (HCC) Comments: Diabetic foot exam performed. She is encouraged to continue with her regular exercise regimen. She will f/u in 4 months for re-evaluation. I DISCUSSED WITH THE PATIENT AT LENGTH REGARDING THE GOALS OF GLYCEMIC CONTROL AND POSSIBLE LONG-TERM COMPLICATIONS.  I  ALSO STRESSED THE IMPORTANCE OF COMPLIANCE WITH HOME GLUCOSE MONITORING, DIETARY  RESTRICTIONS INCLUDING AVOIDANCE OF SUGARY DRINKS/PROCESSED FOODS,  ALONG WITH  REGULAR EXERCISE.  I  ALSO STRESSED THE IMPORTANCE OF ANNUAL EYE EXAMS, SELF FOOT CARE AND COMPLIANCE WITH OFFICE VISITS.  - CBC - Hemoglobin A1c - CMP14+EGFR  3. Benign hypertensive renal disease Comments: Chronic, controlled. She is encouraged to follow low sodium diet. EKG performed, NSR w/o acute changes. She will f/u in 4 months for re-evaluation. - POCT Urinalysis Dipstick (81002) - POCT UA - Microalbumin - EKG 12-Lead  4. Postablative hypothyroidism Comments: I will check thyroid panel and adjust meds as needed.  - TSH + free T4  5. Left hand pain Comments: I will refer her to Hand specialist as requested.  - Ambulatory referral to Hand Surgery  6. Bilateral impacted cerumen AFTER OBTAINING VERBAL CONSENT, BOTH EARS WERE FLUSHED BY IRRIGATION. SHE TOLERATED PROCEDURE WELL WITHOUT ANY COMPLICATIONS. NO TM ABNORMALITIES WERE NOTED.  - Ear Lavage  7. Atherosclerosis of aorta ([place]) Comments: She is encouraged to follow heart healthy lifestyle - advised to limit intake of fried foods, maintain regular activity and take meds as directed.   8. Vitamin D deficiency disease Comments: I will check vitamin D level and supplement as needed.  - VITAMIN D 25 Hydroxy (Vit-D Deficiency, Fractures)  9. Overweight (BMI 25.0-29.9) Comments: Her BMI is acceptable for her demographic. Encouraged to aim for at least 150 minutes of exercise per week.   10. Immunization due Comments: I will send rx Shingrix to her local pharmacy.   Patient was given opportunity to ask questions. Patient verbalized understanding of the plan and was able to repeat key elements of the plan. All questions were answered to their satisfaction.   I, Brooke Greenland, MD, have reviewed all documentation for this visit. The documentation on 12/31/20 for the exam, diagnosis, procedures, and orders are all accurate and complete.  THE PATIENT  IS ENCOURAGED TO PRACTICE SOCIAL DISTANCING DUE TO THE COVID-19 PANDEMIC.

## 2021-01-01 LAB — CBC
Hematocrit: 39.6 % (ref 34.0–46.6)
Hemoglobin: 13 g/dL (ref 11.1–15.9)
MCH: 28.1 pg (ref 26.6–33.0)
MCHC: 32.8 g/dL (ref 31.5–35.7)
MCV: 86 fL (ref 79–97)
Platelets: 344 10*3/uL (ref 150–450)
RBC: 4.63 x10E6/uL (ref 3.77–5.28)
RDW: 13.1 % (ref 11.7–15.4)
WBC: 5.5 10*3/uL (ref 3.4–10.8)

## 2021-01-01 LAB — CMP14+EGFR
ALT: 11 IU/L (ref 0–32)
AST: 23 IU/L (ref 0–40)
Albumin/Globulin Ratio: 1.6 (ref 1.2–2.2)
Albumin: 4.5 g/dL (ref 3.7–4.7)
Alkaline Phosphatase: 56 IU/L (ref 44–121)
BUN/Creatinine Ratio: 19 (ref 12–28)
BUN: 14 mg/dL (ref 8–27)
Bilirubin Total: 0.3 mg/dL (ref 0.0–1.2)
CO2: 24 mmol/L (ref 20–29)
Calcium: 10 mg/dL (ref 8.7–10.3)
Chloride: 102 mmol/L (ref 96–106)
Creatinine, Ser: 0.74 mg/dL (ref 0.57–1.00)
Globulin, Total: 2.9 g/dL (ref 1.5–4.5)
Glucose: 103 mg/dL — ABNORMAL HIGH (ref 65–99)
Potassium: 3.9 mmol/L (ref 3.5–5.2)
Sodium: 140 mmol/L (ref 134–144)
Total Protein: 7.4 g/dL (ref 6.0–8.5)
eGFR: 86 mL/min/{1.73_m2} (ref >59)

## 2021-01-01 LAB — VITAMIN D 25 HYDROXY (VIT D DEFICIENCY, FRACTURES): Vit D, 25-Hydroxy: 109 ng/mL — ABNORMAL HIGH (ref 30.0–100.0)

## 2021-01-01 LAB — TSH+FREE T4
Free T4: 1.5 ng/dL (ref 0.82–1.77)
TSH: 1.16 u[IU]/mL (ref 0.450–4.500)

## 2021-01-01 LAB — HEMOGLOBIN A1C
Est. average glucose Bld gHb Est-mCnc: 140 mg/dL
Hgb A1c MFr Bld: 6.5 % — ABNORMAL HIGH (ref 4.8–5.6)

## 2021-01-15 ENCOUNTER — Other Ambulatory Visit: Payer: Self-pay | Admitting: Internal Medicine

## 2021-01-15 DIAGNOSIS — Z1231 Encounter for screening mammogram for malignant neoplasm of breast: Secondary | ICD-10-CM

## 2021-02-11 ENCOUNTER — Other Ambulatory Visit: Payer: Self-pay | Admitting: Internal Medicine

## 2021-02-26 ENCOUNTER — Other Ambulatory Visit: Payer: Self-pay

## 2021-02-26 ENCOUNTER — Ambulatory Visit
Admission: RE | Admit: 2021-02-26 | Discharge: 2021-02-26 | Disposition: A | Discharge status: Home / Self Care | Payer: PPO | Source: Ambulatory Visit | Attending: Internal Medicine | Admitting: Internal Medicine

## 2021-02-26 DIAGNOSIS — Z1231 Encounter for screening mammogram for malignant neoplasm of breast: Secondary | ICD-10-CM | POA: Diagnosis not present

## 2021-02-27 ENCOUNTER — Ambulatory Visit: Payer: PPO | Admitting: Podiatry

## 2021-03-04 DIAGNOSIS — M79641 Pain in right hand: Secondary | ICD-10-CM | POA: Diagnosis not present

## 2021-03-04 DIAGNOSIS — M79642 Pain in left hand: Secondary | ICD-10-CM | POA: Diagnosis not present

## 2021-03-04 DIAGNOSIS — M189 Osteoarthritis of first carpometacarpal joint, unspecified: Secondary | ICD-10-CM | POA: Insufficient documentation

## 2021-03-04 DIAGNOSIS — M1812 Unilateral primary osteoarthritis of first carpometacarpal joint, left hand: Secondary | ICD-10-CM | POA: Diagnosis not present

## 2021-03-04 DIAGNOSIS — M180 Bilateral primary osteoarthritis of first carpometacarpal joints: Secondary | ICD-10-CM | POA: Diagnosis not present

## 2021-03-19 ENCOUNTER — Other Ambulatory Visit: Payer: Self-pay | Admitting: Internal Medicine

## 2021-04-02 ENCOUNTER — Other Ambulatory Visit: Payer: Self-pay | Admitting: Internal Medicine

## 2021-04-03 ENCOUNTER — Other Ambulatory Visit: Payer: Self-pay | Admitting: Internal Medicine

## 2021-04-04 ENCOUNTER — Encounter: Payer: Self-pay | Admitting: Internal Medicine

## 2021-04-07 ENCOUNTER — Other Ambulatory Visit: Payer: Self-pay

## 2021-04-24 ENCOUNTER — Other Ambulatory Visit: Payer: Self-pay | Admitting: Internal Medicine

## 2021-05-05 ENCOUNTER — Encounter: Payer: Self-pay | Admitting: Internal Medicine

## 2021-05-05 ENCOUNTER — Ambulatory Visit (INDEPENDENT_AMBULATORY_CARE_PROVIDER_SITE_OTHER): Payer: PPO | Admitting: Internal Medicine

## 2021-05-05 ENCOUNTER — Other Ambulatory Visit: Payer: Self-pay

## 2021-05-05 VITALS — BP 130/74 | HR 86 | Temp 98.2°F | Ht 64.6 in | Wt 154.8 lb

## 2021-05-05 DIAGNOSIS — R252 Cramp and spasm: Secondary | ICD-10-CM

## 2021-05-05 DIAGNOSIS — N182 Chronic kidney disease, stage 2 (mild): Secondary | ICD-10-CM | POA: Diagnosis not present

## 2021-05-05 DIAGNOSIS — I129 Hypertensive chronic kidney disease with stage 1 through stage 4 chronic kidney disease, or unspecified chronic kidney disease: Secondary | ICD-10-CM | POA: Diagnosis not present

## 2021-05-05 DIAGNOSIS — E890 Postprocedural hypothyroidism: Secondary | ICD-10-CM

## 2021-05-05 DIAGNOSIS — R7989 Other specified abnormal findings of blood chemistry: Secondary | ICD-10-CM

## 2021-05-05 DIAGNOSIS — Z23 Encounter for immunization: Secondary | ICD-10-CM

## 2021-05-05 DIAGNOSIS — E1122 Type 2 diabetes mellitus with diabetic chronic kidney disease: Secondary | ICD-10-CM

## 2021-05-05 MED ORDER — LIVALO 2 MG PO TABS: 1.0000 | ORAL_TABLET | Freq: Every day | ORAL | 2 refills | Status: DC

## 2021-05-05 MED ORDER — ONETOUCH ULTRA VI STRP: ORAL_STRIP | 2 refills | Status: DC

## 2021-05-05 MED ORDER — ONETOUCH DELICA LANCETS 30G MISC: 2 refills | Status: DC

## 2021-05-05 NOTE — Progress Notes (Signed)
Rich Brave [place]leton,acting as a Education administrator for [name], MD.,have documented all relevant documentation on the behalf of [name], MD,as directed by  [name], MD while in the presence of [name], MD.  This visit occurred during the SARS-CoV-2 public health emergency.  Safety protocols were in place, including screening questions prior to the visit, additional usage of staff PPE, and extensive cleaning of exam room while observing appropriate contact time as indicated for disinfecting solutions.  Subjective:     Patient ID: Brooke Brown , female    DOB: Dec 24, 1950 , 70 y.o.   MRN: [id number]   Chief Complaint  Patient presents with   Diabetes   Hypertension    HPI  She presents today for diabetes, blood pressure check. She reports compliance with meds. She denies headaches, chest pain and shortness of breath. She does admit that she feels "bad' when her blood sugars are in the 90s.   Diabetes She presents for her follow-up diabetic visit. She has type 2 diabetes mellitus. Her disease course has been stable. There are no hypoglycemic associated symptoms. Pertinent negatives for diabetes include no blurred vision, no polydipsia, no polyphagia and no polyuria. There are no hypoglycemic complications. Diabetic complications include nephropathy. Risk factors for coronary artery disease include diabetes mellitus, dyslipidemia, hypertension, post-menopausal and sedentary lifestyle. Her weight is stable. She is following a diabetic diet. She participates in exercise three times a week. Her breakfast blood glucose is taken between 8-9 am. Her breakfast blood glucose range is generally 110-130 mg/dl. Eye exam is current.  Hypertension This is a chronic problem. The current episode started more than 1 year ago. The problem has been gradually improving since onset. The problem is controlled. Pertinent negatives include no blurred vision, neck pain or palpitations.     Past Medical History:  Diagnosis Date   Anxiety    Arthritis    Diabetes mellitus    Grave's disease    Grave's disease    Hyperlipemia    Hypertension      Family History  Problem Relation Age of Onset   Pancreatic cancer Mother    Heart failure Father    Diabetes Brother    Breast cancer Cousin      Current Outpatient Medications:    acetaminophen (TYLENOL) 500 MG tablet, Take 500 mg by mouth daily as needed for moderate pain or headache., Disp: , Rfl:    amLODipine (NORVASC) 5 MG tablet, Take 1 tablet by mouth once daily, Disp: 90 tablet, Rfl: 1   aspirin EC 81 MG tablet, Take 81 mg by mouth daily., Disp: , Rfl:    Blood Glucose Monitoring Suppl (ONE TOUCH ULTRA 2) w/Device KIT, Use to check blood sugar 3 times a day. Dx code e11.65, Disp: 1 kit, Rfl: 2   calcium carbonate (OSCAL) 1500 (600 Ca) MG TABS tablet, Take 600 mg of elemental calcium by mouth daily., Disp: , Rfl:    Carboxymethylcellul-Glycerin (LUBRICATING EYE DROPS OP), Place 1 drop into both eyes 2 (two) times daily., Disp: , Rfl:    Cholecalciferol (VITAMIN D3) 125 MCG (5000 UT) CAPS, Take 1 capsule (5,000 Units total) by mouth daily. Do not take on saturday and Sunday., Disp: 30 capsule, Rfl: 0   hydrochlorothiazide (HYDRODIURIL) 25 MG tablet, Take 1 tablet by mouth once daily, Disp: 90 tablet, Rfl: 2   Magnesium 250 MG TABS, otc, Disp:  , Rfl: 0   metFORMIN (GLUCOPHAGE-XR) 500 MG 24 hr tablet, Take 1  tablet by mouth twice daily, Disp: 180 tablet, Rfl: 1   Multiple Vitamin (MULTIVITAMIN WITH MINERALS) TABS tablet, Take 1 tablet by mouth daily., Disp: , Rfl:    olmesartan (BENICAR) 40 MG tablet, Take 1 tablet by mouth once daily, Disp: 90 tablet, Rfl: 1   SYNTHROID 112 MCG tablet, TAKE 1 TABLET BY MOUTH ONCE DAILY MONDAY-SATURDAY  AND  ONE  HALF  TABLET  ON  SUNDAY, Disp: 90 tablet, Rfl: 1   TURMERIC PO, Take 500 mg by mouth daily at 6 (six) AM., Disp: , Rfl:    OneTouch Delica Lancets 94T MISC, USE AS DIRECTED  TO CHECK BLOOD SUGARS ONCE DAILY, Disp: 100 each, Rfl: 2   ONETOUCH ULTRA test strip, USE AS INSTRUCTED TO CHECK BLOOD SUGARS ONE TIME PER DAY, Disp: 100 each, Rfl: 2   Pitavastatin Calcium (LIVALO) 2 MG TABS, Take 1 tablet (2 mg total) by mouth daily., Disp: 90 tablet, Rfl: 2   No Known Allergies   Review of Systems  Constitutional: Negative.   Eyes:  Negative for blurred vision.  Respiratory: Negative.    Cardiovascular: Negative.  Negative for palpitations.  Endocrine: Negative for polydipsia, polyphagia and polyuria.  Musculoskeletal:  Negative for neck pain.       She c/o muscle spasm in her forehead/temples. Occurs intermittently, unable to determine what triggers her sx other than lying down at night. She states the spasms are not painful. No associated visual disturbance.   Neurological: Negative.   Psychiatric/Behavioral: Negative.      Today's Vitals   05/05/21 0930  BP: 130/74  Pulse: 86  Temp: 98.2 F (36.8 C)  Weight: 154 lb 12.8 oz (70.2 kg)  Height: 5' 4.6" (1.641 m)  PainSc: 0-No pain   Body mass index is 26.08 kg/m.   Wt Readings from Last 3 Encounters:  05/05/21 154 lb 12.8 oz (70.2 kg)  12/31/20 157 lb (71.2 kg)  08/29/20 156 lb (70.8 kg)     Objective:  Physical Exam Vitals and nursing note reviewed.  Constitutional:      Appearance: Normal appearance.  HENT:     Head: Normocephalic and atraumatic.     Nose:     Comments: Masked     Mouth/Throat:     Comments: Masked  Eyes:     Extraocular Movements: Extraocular movements intact.  Cardiovascular:     Rate and Rhythm: Normal rate and regular rhythm.     Heart sounds: Normal heart sounds.  Pulmonary:     Effort: Pulmonary effort is normal.     Breath sounds: Normal breath sounds.  Skin:    General: Skin is warm.  Neurological:     General: No focal deficit present.     Mental Status: She is alert.  Psychiatric:        Mood and Affect: Mood normal.        Behavior: Behavior normal.         Assessment And Plan:     1. Type 2 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 disease, without long-term current use of insulin (HCC) Comments: Chronic, she will continue with current meds. I will check labs as listed below. I will adjust meds as needed. She will rto in 4 months for re-evaluation.  - BMP8+eGFR - Hemoglobin A1c  2. Benign hypertensive renal disease Comments: Chronic, controlled. Encouraged to follow low sodium diet. No med changes today. - BMP8+eGFR  3. Postablative hypothyroidism Comments: I will check TSH today. For now, she will  continue with Synthroid 164mg daily.  - TSH  4. High serum vitamin D Comments: I will recheck vitamin D level today. Pt advised that she will likely tolerate higher dose Vitamin D during winter months, and should cut back b/w April-Oct.  - Vitamin D (25 hydroxy)  5. Spasm Comments: Pt encouraged to stay well hydrated. May benefit from nightly Mg supplementation. She will let me know if her sx persist.  6. Immunization due Comments: She agrees to have Shingrix administered after the first of the year.    Patient was given opportunity to ask questions. Patient verbalized understanding of the plan and was able to repeat key elements of the plan. All questions were answered to their satisfaction.   I, RMaximino Greenland MD, have reviewed all documentation for this visit. The documentation on 05/05/21 for the exam, diagnosis, procedures, and orders are all accurate and complete.   IF YOU HAVE BEEN REFERRED TO A SPECIALIST, IT MAY TAKE 1-2 WEEKS TO SCHEDULE/PROCESS THE REFERRAL. IF YOU HAVE NOT HEARD FROM US/SPECIALIST IN TWO WEEKS, PLEASE GIVE [facility] CALL AT [phone].   THE PATIENT IS ENCOURAGED TO PRACTICE SOCIAL DISTANCING DUE TO THE COVID-19 PANDEMIC.

## 2021-05-05 NOTE — Patient Instructions (Signed)

## 2021-05-06 LAB — TSH: TSH: 2.18 u[IU]/mL (ref 0.450–4.500)

## 2021-05-06 LAB — BMP8+EGFR
BUN/Creatinine Ratio: 12 (ref 12–28)
BUN: 11 mg/dL (ref 8–27)
CO2: 26 mmol/L (ref 20–29)
Calcium: 9.7 mg/dL (ref 8.7–10.3)
Chloride: 101 mmol/L (ref 96–106)
Creatinine, Ser: 0.92 mg/dL (ref 0.57–1.00)
Glucose: 101 mg/dL — ABNORMAL HIGH (ref 70–99)
Potassium: 4.2 mmol/L (ref 3.5–5.2)
Sodium: 138 mmol/L (ref 134–144)
eGFR: 66 mL/min/{1.73_m2} (ref >59)

## 2021-05-06 LAB — HEMOGLOBIN A1C
Est. average glucose Bld gHb Est-mCnc: 137 mg/dL
Hgb A1c MFr Bld: 6.4 % — ABNORMAL HIGH (ref 4.8–5.6)

## 2021-05-06 LAB — VITAMIN D 25 HYDROXY (VIT D DEFICIENCY, FRACTURES): Vit D, 25-Hydroxy: 128 ng/mL — ABNORMAL HIGH (ref 30.0–100.0)

## 2021-05-07 ENCOUNTER — Other Ambulatory Visit: Payer: Self-pay | Admitting: Internal Medicine

## 2021-05-07 DIAGNOSIS — N182 Chronic kidney disease, stage 2 (mild): Secondary | ICD-10-CM

## 2021-05-07 DIAGNOSIS — I129 Hypertensive chronic kidney disease with stage 1 through stage 4 chronic kidney disease, or unspecified chronic kidney disease: Secondary | ICD-10-CM

## 2021-05-07 DIAGNOSIS — E890 Postprocedural hypothyroidism: Secondary | ICD-10-CM

## 2021-05-07 DIAGNOSIS — E1122 Type 2 diabetes mellitus with diabetic chronic kidney disease: Secondary | ICD-10-CM

## 2021-05-23 ENCOUNTER — Telehealth: Payer: Self-pay | Admitting: *Deleted

## 2021-05-23 NOTE — Chronic Care Management (AMB) (Signed)
Chronic Care Management   Note  05/23/2021 Name: Laiklynn Raczynski MRN: [id number] DOB: 02-07-1951  Waymond Cera Karge is a 70 y.o. year old female who is a primary care patient of Glendale Chard, MD. I reached out to [facility] by phone today in response to a referral sent by Ms. Waymond Cera Finger's PCP.  Ms. Hogston was given information about Chronic Care Management services today including:  CCM service includes personalized support from designated clinical staff supervised by her physician, including individualized plan of care and coordination with other care providers 24/7 contact phone numbers for assistance for urgent and routine care needs. Service will only be billed when office clinical staff spend 20 minutes or more in a month to coordinate care. Only one practitioner may furnish and bill the service in a calendar month. The patient may stop CCM services at any time (effective at the end of the month) by phone call to the office staff. The patient is responsible for co-pay (up to 20% after annual deductible is met) if co-pay is required by the individual health plan.   Patient agreed to services and verbal consent obtained.   Follow up plan: Telephone appointment with care management team member scheduled for:BSW 05/28/21 and PharmD 06/06/21  [facility]  Direct Dial: [phone]

## 2021-05-28 ENCOUNTER — Telehealth: Payer: PPO

## 2021-05-28 ENCOUNTER — Telehealth: Payer: Self-pay

## 2021-05-28 ENCOUNTER — Ambulatory Visit (INDEPENDENT_AMBULATORY_CARE_PROVIDER_SITE_OTHER): Payer: PPO

## 2021-05-28 ENCOUNTER — Ambulatory Visit: Payer: PPO

## 2021-05-28 DIAGNOSIS — N182 Chronic kidney disease, stage 2 (mild): Secondary | ICD-10-CM

## 2021-05-28 DIAGNOSIS — I129 Hypertensive chronic kidney disease with stage 1 through stage 4 chronic kidney disease, or unspecified chronic kidney disease: Secondary | ICD-10-CM

## 2021-05-28 DIAGNOSIS — E1122 Type 2 diabetes mellitus with diabetic chronic kidney disease: Secondary | ICD-10-CM

## 2021-05-28 DIAGNOSIS — E890 Postprocedural hypothyroidism: Secondary | ICD-10-CM

## 2021-05-28 NOTE — Patient Instructions (Signed)
Social Worker Visit Information  Goals we discussed today:  Patient Goals/Self-Care Activities patient will:   - Review mailed resource information -Engage with Consulting civil engineer and PharmD to establish an individualized plan of care  Materials provided: Yes: verbal and written education on Advance Directive's  Ms. Furgason was given information about Chronic Care Management services today including:  CCM service includes personalized support from designated clinical staff supervised by her physician, including individualized plan of care and coordination with other care providers 24/7 contact phone numbers for assistance for urgent and routine care needs. Service will only be billed when office clinical staff spend 20 minutes or more in a month to coordinate care. Only one practitioner may furnish and bill the service in a calendar month. The patient may stop CCM services at any time (effective at the end of the month) by phone call to the office staff. The patient will be responsible for cost sharing (co-pay) of up to 20% of the service fee (after annual deductible is met).  Patient agreed to services and verbal consent obtained.   Patient verbalizes understanding of instructions provided today and agrees to view in [place].   Follow up plan: SW will follow up with patient by phone over the next 45 days   Daneen Schick, BSW, CDP Social Worker, Certified Dementia Practitioner [place] [facility] [place] [facility] [phone]

## 2021-05-28 NOTE — Chronic Care Management (AMB) (Signed)
Chronic Care Management   CCM RN Visit Note  05/28/2021 Name: Brooke Brown MRN: [id number] DOB: [birth year]  Subjective: Brooke Brown is a 71 y.o. year old female who is a primary care patient of Glendale Chard, MD. The care management team was consulted for assistance with disease management and care coordination needs.    Collaboration with Daneen Schick BSW  for  initiation of [place]  in response to provider referral for case management and/or care coordination services.   Consent to Services:  The patient was given the following information about Chronic Care Management services today, agreed to services, and gave verbal consent: 1. CCM service includes personalized support from designated clinical staff supervised by the primary care provider, including individualized plan of care and coordination with other care providers 2. 24/7 contact phone numbers for assistance for urgent and routine care needs. 3. Service will only be billed when office clinical staff spend 20 minutes or more in a month to coordinate care. 4. Only one practitioner may furnish and bill the service in a calendar month. 5.The patient may stop CCM services at any time (effective at the end of the month) by phone call to the office staff. 6. The patient will be responsible for cost sharing (co-pay) of up to 20% of the service fee (after annual deductible is met). Patient agreed to services and consent obtained.  Patient agreed to services and verbal consent obtained.   Assessment: Review of patient past medical history, allergies, medications, health status, including review of consultants reports, laboratory and other test data, was performed as part of comprehensive evaluation and provision of chronic care management services.   SDOH (Social Determinants of Health) assessments and interventions performed:    CCM Care Plan  No Known Allergies  Outpatient Encounter  Medications as of 05/28/2021  Medication Sig   acetaminophen (TYLENOL) 500 MG tablet Take 500 mg by mouth daily as needed for moderate pain or headache.   amLODipine (NORVASC) 5 MG tablet Take 1 tablet by mouth once daily   aspirin EC 81 MG tablet Take 81 mg by mouth daily.   Blood Glucose Monitoring Suppl (ONE TOUCH ULTRA 2) w/Device KIT Use to check blood sugar 3 times a day. Dx code e11.65   calcium carbonate (OSCAL) 1500 (600 Ca) MG TABS tablet Take 600 mg of elemental calcium by mouth daily.   Carboxymethylcellul-Glycerin (LUBRICATING EYE DROPS OP) Place 1 drop into both eyes 2 (two) times daily.   Cholecalciferol (VITAMIN D3) 125 MCG (5000 UT) CAPS Take 1 capsule (5,000 Units total) by mouth daily. Do not take on saturday and Sunday.   hydrochlorothiazide (HYDRODIURIL) 25 MG tablet Take 1 tablet by mouth once daily   Magnesium 250 MG TABS otc   metFORMIN (GLUCOPHAGE-XR) 500 MG 24 hr tablet Take 1 tablet by mouth twice daily   Multiple Vitamin (MULTIVITAMIN WITH MINERALS) TABS tablet Take 1 tablet by mouth daily.   olmesartan (BENICAR) 40 MG tablet Take 1 tablet by mouth once daily   OneTouch Delica Lancets 53G MISC USE AS DIRECTED TO CHECK BLOOD SUGARS ONCE DAILY   ONETOUCH ULTRA test strip USE AS INSTRUCTED TO CHECK BLOOD SUGARS ONE TIME PER DAY   Pitavastatin Calcium (LIVALO) 2 MG TABS Take 1 tablet (2 mg total) by mouth daily.   SYNTHROID 112 MCG tablet TAKE 1 TABLET BY MOUTH ONCE DAILY MONDAY-SATURDAY  AND  ONE  HALF  TABLET  ON  SUNDAY   TURMERIC PO  Take 500 mg by mouth daily at 6 (six) AM.   No facility-administered encounter medications on file as of 05/28/2021.    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 02/27/2020   Hypothyroidism 02/27/2020   Other long term (current) drug therapy 02/27/2020   Overweight with body mass index (BMI) of 27 to 27.9  in adult 12/26/2019   Vitamin D deficiency disease 12/26/2019   Pure hypercholesterolemia 03/29/2019   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   Overweight (BMI 25.0-29.9) 03/29/2019   Right wrist pain 09/30/2015   Unspecified essential hypertension 12/20/2013   Other and unspecified hyperlipidemia 12/20/2013   Type 2 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 disease, without long-term current use of insulin ([place]) 12/20/2013   Neuralgia 12/20/2013   Neck pain 12/20/2013   Dizziness 12/20/2013    Conditions to be addressed/monitored:Type 2 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 disease, without long-term current use of insulin, Benign hypertensive renal disease,Postablative hypothyroidism  Care Plan : RN Care Manager Plan of Care  Updates made by Lynne Logan, RN since 05/28/2021 12:00 AM     Problem: No plan established with management of chronic disease states (Type 2 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 disease, without long-term current use of insulin, Benign hypertensive renal disease,Postablative hypothyroidism)   Priority: High     Long-Range Goal: Establishment of plan of care for management of chronic disease states (Type 2 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 disease, without long-term current use of insulin, Benign hypertensive renal disease,Postablative hypothyroidism)   Start Date: 05/28/2021  Expected End Date: 05/28/2022  This Visit's Progress: On track  Priority: High  Note:   Current Barriers:  Knowledge Deficits related to plan of care for management of Type 2 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 disease, without long-term current use of insulin, Benign hypertensive renal disease,Postablative hypothyroidism  Chronic Disease Management support and education needs related to Type 2 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 disease, without long-term current use of insulin, Benign hypertensive renal disease,Postablative  hypothyroidism   RNCM Clinical Goal(s):  Patient will continue to work with RN Care Manager to address care management and care coordination needs related to  Type 2 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 disease, without long-term current use of insulin, Benign hypertensive renal disease,Postablative hypothyroidism as evidenced by adherence to CM Team Scheduled appointments through collaboration with RN Care manager, provider, and care team.   Interventions: 1:1 collaboration with primary care provider regarding development and update of comprehensive plan of care as evidenced by provider attestation and co-signature Inter-disciplinary care team collaboration (see longitudinal plan of care) Evaluation of current treatment plan related to  self management and patient's adherence to plan as established by provider  Interdisciplinary Collaboration Interventions:  (Status: New goal.) Long Term Goal   Collaborated with BSW to initiate plan of care to address needs related to Limited education about Advanced Directives* in patient with  Type 2 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 disease, without long-term current use of insulin, Benign hypertensive renal disease,Postablative hypothyroidism Collaboration with Glendale Chard, MD regarding development and update of comprehensive plan of care as evidenced by provider attestation and co-signature Inter-disciplinary care team collaboration   Patient Goals/Self-Care Activities: Take all medications as prescribed Attend all scheduled provider appointments Call pharmacy for medication refills 3-7 days in advance of running  out of medications Perform all self care activities independently  Perform IADL's (shopping, preparing meals, housekeeping, managing finances) independently Call provider office for new concerns or questions  Work with the social worker to address care coordination needs and will continue to work with the clinical team to  address health care and disease management related needs  Follow Up Plan:  Telephone follow up appointment with care management team member scheduled for:  06/24/21      Plan:Telephone follow up appointment with care management team member scheduled for:  06/24/21  Barb Merino, RN, BSN, CCM Care Management Coordinator [place] [facility]  Direct Phone: [phone]

## 2021-05-28 NOTE — Telephone Encounter (Signed)
°  Care Management   Follow Up Note   05/28/2021 Name: Brooke Brown MRN: [id number] DOB: 02-May-1951   Referred by: Glendale Chard, MD Reason for referral : Chronic Care Management (Unsuccessful call)   An unsuccessful telephone outreach was attempted today. The patient was referred to the case management team for assistance with care management and care coordination.   Follow Up Plan: The care management team will reach out to the patient again over the next 30 days.   Daneen Schick, BSW, CDP Social Worker, Certified Dementia Practitioner [place] [facility] [place] [facility] [phone]

## 2021-05-28 NOTE — Chronic Care Management (AMB) (Signed)
Chronic Care Management    Social Work Note  05/28/2021 Name: Brooke Brown MRN: [id number] DOB: 1950-11-26  Brooke Brown is a 71 y.o. year old female who is a primary care patient of Glendale Chard, MD. The CCM team was consulted to assist the patient with chronic disease management and/or care coordination needs related to:  HTN, DM II, HLD .   Engaged with patient by telephone for initial visit in response to provider referral for social work chronic care management and care coordination services.   Consent to Services:  The patient was given the following information about Chronic Care Management services today, agreed to services, and gave verbal consent: 1. CCM service includes personalized support from designated clinical staff supervised by the primary care provider, including individualized plan of care and coordination with other care providers 2. 24/7 contact phone numbers for assistance for urgent and routine care needs. 3. Service will only be billed when office clinical staff spend 20 minutes or more in a month to coordinate care. 4. Only one practitioner may furnish and bill the service in a calendar month. 5.The patient may stop CCM services at any time (effective at the end of the month) by phone call to the office staff. 6. The patient will be responsible for cost sharing (co-pay) of up to 20% of the service fee (after annual deductible is met). Patient agreed to services and consent obtained.  Patient agreed to services and consent obtained.   Assessment: Review of patient past medical history, allergies, medications, and health status, including review of relevant consultants reports was performed today as part of a comprehensive evaluation and provision of chronic care management and care coordination services.     SDOH (Social Determinants of Health) assessments and interventions performed:  SDOH Interventions    Flowsheet Row Most Recent Value  SDOH  Interventions   Food Insecurity Interventions Intervention Not Indicated  Housing Interventions Intervention Not Indicated  Transportation Interventions Intervention Not Indicated        Advanced Directives Status: See Care Plan for related entries.  CCM Care Plan  No Known Allergies  Outpatient Encounter Medications as of 05/28/2021  Medication Sig   acetaminophen (TYLENOL) 500 MG tablet Take 500 mg by mouth daily as needed for moderate pain or headache.   amLODipine (NORVASC) 5 MG tablet Take 1 tablet by mouth once daily   aspirin EC 81 MG tablet Take 81 mg by mouth daily.   Blood Glucose Monitoring Suppl (ONE TOUCH ULTRA 2) w/Device KIT Use to check blood sugar 3 times a day. Dx code e11.65   calcium carbonate (OSCAL) 1500 (600 Ca) MG TABS tablet Take 600 mg of elemental calcium by mouth daily.   Carboxymethylcellul-Glycerin (LUBRICATING EYE DROPS OP) Place 1 drop into both eyes 2 (two) times daily.   Cholecalciferol (VITAMIN D3) 125 MCG (5000 UT) CAPS Take 1 capsule (5,000 Units total) by mouth daily. Do not take on saturday and Sunday.   hydrochlorothiazide (HYDRODIURIL) 25 MG tablet Take 1 tablet by mouth once daily   Magnesium 250 MG TABS otc   metFORMIN (GLUCOPHAGE-XR) 500 MG 24 hr tablet Take 1 tablet by mouth twice daily   Multiple Vitamin (MULTIVITAMIN WITH MINERALS) TABS tablet Take 1 tablet by mouth daily.   olmesartan (BENICAR) 40 MG tablet Take 1 tablet by mouth once daily   OneTouch Delica Lancets 34V MISC USE AS DIRECTED TO CHECK BLOOD SUGARS ONCE DAILY   ONETOUCH ULTRA test strip USE AS INSTRUCTED TO CHECK  BLOOD SUGARS ONE TIME PER DAY   Pitavastatin Calcium (LIVALO) 2 MG TABS Take 1 tablet (2 mg total) by mouth daily.   SYNTHROID 112 MCG tablet TAKE 1 TABLET BY MOUTH ONCE DAILY MONDAY-SATURDAY  AND  ONE  HALF  TABLET  ON  SUNDAY   TURMERIC PO Take 500 mg by mouth daily at 6 (six) AM.   No facility-administered encounter medications on file as of 05/28/2021.     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   Overweight (BMI 25.0-29.9) 03/29/2019   Right wrist pain 09/30/2015   Unspecified essential hypertension 12/20/2013   Other and unspecified hyperlipidemia 12/20/2013   Type 2 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 disease, without long-term current use of insulin ([place]) 12/20/2013   Neuralgia 12/20/2013   Neck pain 12/20/2013   Dizziness 12/20/2013    Conditions to be addressed/monitored: HTN, HLD, and DMII;  Advance Directive Education  Care Plan : Social Work Plan of Care  Updates made by Daneen Schick since 05/28/2021 12:00 AM     Problem: Quality of Life (General Plan of Care)      Goal: Advance Directive Education   Start Date: 05/28/2021  Priority: High  Note:   Current Barriers:  Chronic disease management support and education needs related to HTN, HLD, and DM  Limited education of the importance of an Estate agent Clinical Goal(s):  patient will work with SW to identify and address any acute and/or chronic care coordination needs related to the self health management of HTN, DM, and HLD   Patient will work with SW to gain a better understanding of the importance of completing an Forensic scientist SW Interventions:  Inter-disciplinary care team collaboration (see longitudinal plan of care) Collaboration with Glendale Chard, MD regarding development and update of comprehensive plan of care as evidenced by provider attestation and co-signature Telephonic visit completed with the  patient to conduct an SDoH screen - no acute challenges identified at this time Determined the patient does not have an Advance Directive but is interested in completing one in the future Verbal education provided on how to complete an Advance Directive and the difference between a health care power of attorney and living will Mailed the patient an Advance Directive packet for review and completion Discussed follow up with SW while patient engages with  RN Case Manager and Pharmacist  to address care management needs Scheduled follow up call over the next 45 days Collaboration with RN Care Manager to discuss patient enrollment status, interventions, and plan Patient Goals/Self-Care Activities patient will:   -  Review mailed resource information -Engage with Consulting civil engineer and PharmD to establish an individualized plan of care Follow Up Plan: The care management team will reach out to the patient again over the next 45 days.        Follow Up Plan: SW will follow up with patient by phone over the next 45 days.      Daneen Schick, BSW, CDP Social Worker, Certified Dementia Practitioner [place] [facility] [phone]

## 2021-05-30 ENCOUNTER — Telehealth: Payer: Self-pay

## 2021-05-30 NOTE — Chronic Care Management (AMB) (Signed)
Chronic Care Management Pharmacy Assistant   Name: Brooke Brown  MRN: [id number] DOB: Jan 21, 1951  Reason for Encounter: Chart review for CPP visit on 06-06-2021  Conditions to be addressed/monitored: HTN, HLD, DMII, and Hypothyroidism  Recent office visits:  05-28-2021 Lynne Logan, RN (CCM)  05-28-2021 Daneen Schick (CCM)  05-05-2021 Glendale Chard, MD. Glucose= 101. A1C= 6.4. Vitamin D= 128. INCREASE Pitavastatin 1 mg daily to 2 mg daily.  12-31-2020 Glendale Chard, MD. Referral placed to hand surgery. Order placed for ear lavage. Glucose= 103. A1C= 6.5. Vitamin D= 109. Shingrix ordered.  Recent consult visits:  03-04-2021 Orene Desanctis, MD (Orthopaedic). XR of both hands.  Hospital visits:  None in previous 6 months  Medications: Outpatient Encounter Medications as of 05/30/2021  Medication Sig   acetaminophen (TYLENOL) 500 MG tablet Take 500 mg by mouth daily as needed for moderate pain or headache.   amLODipine (NORVASC) 5 MG tablet Take 1 tablet by mouth once daily   aspirin EC 81 MG tablet Take 81 mg by mouth daily.   Blood Glucose Monitoring Suppl (ONE TOUCH ULTRA 2) w/Device KIT Use to check blood sugar 3 times a day. Dx code e11.65   calcium carbonate (OSCAL) 1500 (600 Ca) MG TABS tablet Take 600 mg of elemental calcium by mouth daily.   Carboxymethylcellul-Glycerin (LUBRICATING EYE DROPS OP) Place 1 drop into both eyes 2 (two) times daily.   Cholecalciferol (VITAMIN D3) 125 MCG (5000 UT) CAPS Take 1 capsule (5,000 Units total) by mouth daily. Do not take on saturday and Sunday.   hydrochlorothiazide (HYDRODIURIL) 25 MG tablet Take 1 tablet by mouth once daily   Magnesium 250 MG TABS otc   metFORMIN (GLUCOPHAGE-XR) 500 MG 24 hr tablet Take 1 tablet by mouth twice daily   Multiple Vitamin (MULTIVITAMIN WITH MINERALS) TABS tablet Take 1 tablet by mouth daily.   olmesartan (BENICAR) 40 MG tablet Take 1 tablet by mouth once daily   OneTouch Delica  Lancets 76K MISC USE AS DIRECTED TO CHECK BLOOD SUGARS ONCE DAILY   ONETOUCH ULTRA test strip USE AS INSTRUCTED TO CHECK BLOOD SUGARS ONE TIME PER DAY   Pitavastatin Calcium (LIVALO) 2 MG TABS Take 1 tablet (2 mg total) by mouth daily.   SYNTHROID 112 MCG tablet TAKE 1 TABLET BY MOUTH ONCE DAILY MONDAY-SATURDAY  AND  ONE  HALF  TABLET  ON  SUNDAY   TURMERIC PO Take 500 mg by mouth daily at 6 (six) AM.   No facility-administered encounter medications on file as of 05/30/2021.   Have you seen any other providers since your last visit? Patient stated no.  Any changes in your medications or health? Patient stated no.  Any side effects from any medications? Patient stated no.  Do you have an symptoms or problems not managed by your medications? Patient stated no.  Any concerns about your health right now? Patient stated she was having pain in her left hand and saw a hand specialist who gave her an injection. Patient stated she is having pain in the right hand also.  Has your provider asked that you check blood pressure, blood sugar, or follow special diet at home? Patient states she checks blood pressure, blood sugar and doesn't have much of an appetite most days.    Do you get any type of exercise on a regular basis? Patient states she walks an hour 3-4 days weekly.  Can you think of a goal you would like to reach for your health?  Patient stated lowering her A1C and coming off of some medications.  Do you have any problems getting your medications? Patient stated no.  Is there anything that you would like to discuss during the appointment? Patient stated nothing at the moment.  Please bring medications and supplements to appointment   Care Gaps: Shingrix overdue Covid booster overdue AWV 08-29-2020  Star Rating Drugs: Pitavastatin 2 mg- Last filled 04-25-2021 90 DS [facility] Metformin 500 mg- Last filled 03-21-2021 90 DS [facility] Olmesartan 40 mg- Last filled 03-21-2021 90 DS  [place] Clinical Pharmacist Assistant [phone]

## 2021-06-06 ENCOUNTER — Ambulatory Visit: Payer: PPO

## 2021-06-06 DIAGNOSIS — E039 Hypothyroidism, unspecified: Secondary | ICD-10-CM

## 2021-06-06 DIAGNOSIS — E782 Mixed hyperlipidemia: Secondary | ICD-10-CM

## 2021-06-06 NOTE — Progress Notes (Addendum)
Chronic Care Management Pharmacy Note  06/18/2021 Name:  Brooke Brown MRN:  [id number] DOB:  07-18-1950  Summary: Patient reports that she is doing well with her medication regimen.   Recommendations/Changes made from today's visit: Recommend patent be started on Atorvastatin 10 mg tablet on Monday, Wednesday and Friday due to cost of Livalo. Recommended using Synthroid Delivers program to reduce cost.   Plan: Patient to be started on Atorvastatin 20 mg tablet on Monday, Wednesday, Friday. Patients medication to be sent to Synthroid delivers program.    Subjective: Brooke Brown is an 71 y.o. year old female who is a primary patient of Glendale Chard, MD.  The CCM team was consulted for assistance with disease management and care coordination needs.    Engaged with patient by telephone for initial visit in response to provider referral for pharmacy case management and/or care coordination services. She was born in [place], she has one daughter who lives here. Her daughter has three children and her husband is a Theme park manager. She retired in December 2016, and he retired in 2020. He went back to work part time because he wanted to keep busy. She reports they don't do a lot. Very active in church, and a grand son who is in her third year of college, and [employer] which was her first choice. She applied to several schools and she is very proud of all of her children. She is beaming with joy and reports that she an active life with her grandchildren.   Consent to Services:  The patient was given the following information about Chronic Care Management services today, agreed to services, and gave verbal consent: 1. CCM service includes personalized support from designated clinical staff supervised by the primary care provider, including individualized plan of care and coordination with other care providers 2. 24/7 contact phone numbers for assistance for urgent and routine care  needs. 3. Service will only be billed when office clinical staff spend 20 minutes or more in a month to coordinate care. 4. Only one practitioner may furnish and bill the service in a calendar month. 5.The patient may stop CCM services at any time (effective at the end of the month) by phone call to the office staff. 6. The patient will be responsible for cost sharing (co-pay) of up to 20% of the service fee (after annual deductible is met). Patient agreed to services and consent obtained.  Patient Care Team: Glendale Chard, MD as PCP - General (Internal Medicine) Rex Kras Claudette Stapler, RN as Case Manager Mayford Knife, [facility] (Pharmacist)  Recent office visits:  05-28-2021 Lynne Logan, RN (CCM)   05-28-2021 Daneen Schick (CCM)   05-05-2021 Glendale Chard, MD. Glucose= 101. A1C= 6.4. Vitamin D= 128. INCREASE Pitavastatin 1 mg daily to 2 mg daily.   12-31-2020 Glendale Chard, MD. Referral placed to hand surgery. Order placed for ear lavage. Glucose= 103. A1C= 6.5. Vitamin D= 109. Shingrix ordered.   Recent consult visits:  03-04-2021 Orene Desanctis, MD (Orthopaedic). XR of both hands.   Hospital visits:  None in previous 6 months  Objective:  Lab Results  Component Value Date   CREATININE 0.92 05/05/2021   BUN 11 05/05/2021   GFRNONAA 70 04/30/2020   GFRAA 81 04/30/2020   NA 138 05/05/2021   K 4.2 05/05/2021   CALCIUM 9.7 05/05/2021   CO2 26 05/05/2021   GLUCOSE 101 (H) 05/05/2021    Lab Results  Component Value Date/Time   HGBA1C 6.4 (H) 05/05/2021 10:21 AM  HGBA1C 6.5 (H) 12/31/2020 10:41 AM   MICROALBUR 30 12/31/2020 10:38 AM   MICROALBUR 30 12/26/2019 04:50 PM    Last diabetic Eye exam:  Lab Results  Component Value Date/Time   HMDIABEYEEXA No Retinopathy 06/26/2020 12:00 AM    Last diabetic Foot exam: No results found for: HMDIABFOOTEX   Lab Results  Component Value Date   CHOL 159 08/15/2020   HDL 54 08/15/2020   LDLCALC 80 08/15/2020   TRIG 144 08/15/2020    CHOLHDL 2.9 08/15/2020    Hepatic Function Latest Ref Rng & Units 12/31/2020 08/15/2020 12/26/2019  Total Protein 6.0 - 8.5 g/dL 7.4 7.4 7.6  Albumin 3.7 - 4.7 g/dL 4.5 4.3 4.5  AST 0 - 40 IU/L _0 ALT 0 - 32 IU/L _1 Alk Phosphatase 44 - 121 IU/L 56 55 55  Total Bilirubin 0.0 - 1.2 mg/dL 0.3 0.3 0.2    Lab Results  Component Value Date/Time   TSH 2.180 05/05/2021 10:21 AM   TSH 1.160 12/31/2020 10:41 AM   FREET4 1.50 12/31/2020 10:41 AM   FREET4 1.66 04/30/2020 11:37 AM    CBC Latest Ref Rng & Units 12/31/2020 12/26/2019 12/19/2018  WBC 3.4 - 10.8 x10E3/uL 5.5 5.4 4.8  Hemoglobin 11.1 - 15.9 g/dL 13.0 12.6 12.7  Hematocrit 34.0 - 46.6 % 39.6 36.9 37.6  Platelets 150 - 450 x10E3/uL 344 360 351    Lab Results  Component Value Date/Time   VD25OH 128.0 (H) 05/05/2021 10:21 AM   VD25OH 109.0 (H) 12/31/2020 10:41 AM    Clinical ASCVD: Yes  The 10-year ASCVD risk score (Arnett DK, et al., 2019) is: 46.4%   Values used to calculate the score:     Age: 49 years     Sex: Female     Is Non-Hispanic African American: Yes     Diabetic: Yes     Tobacco smoker: Yes     Systolic Blood Pressure: 165 mmHg     Is BP treated: Yes     HDL Cholesterol: 54 mg/dL     Total Cholesterol: 159 mg/dL    Depression screen [facility] 2/9 06/12/2021 08/29/2020 12/25/2019  Decreased Interest 0 0 0  Down, Depressed, Hopeless 0 0 0  PHQ - 2 Score 0 0 0  Altered sleeping - - -  Tired, decreased energy - - -  Change in appetite - - -  Feeling bad or failure about yourself  - - -  Trouble concentrating - - -  Moving slowly or fidgety/restless - - -  Suicidal thoughts - - -  PHQ-9 Score - - -  Difficult doing work/chores - - -     Social History   Tobacco Use  Smoking Status Never  Smokeless Tobacco Never   BP Readings from Last 3 Encounters:  05/05/21 130/74  12/31/20 130/68  08/15/20 126/72   Pulse Readings from Last 3 Encounters:  05/05/21 86  12/31/20 78  08/15/20 73   Wt Readings  from Last 3 Encounters:  06/12/21 152 lb (68.9 kg)  05/05/21 154 lb 12.8 oz (70.2 kg)  12/31/20 157 lb (71.2 kg)   BMI Readings from Last 3 Encounters:  06/12/21 25.29 kg/m  05/05/21 26.08 kg/m  12/31/20 26.45 kg/m    Assessment/Interventions: Review of patient past medical history, allergies, medications, health status, including review of consultants reports, laboratory and other test data, was performed as part of comprehensive evaluation and provision of chronic care management services.   SDOH:  (  Social Determinants of Health) assessments and interventions performed: Yes  SDOH Screenings   Alcohol Screen: Not on file  Depression (PHQ2-9): Low Risk    PHQ-2 Score: 0  Financial Resource Strain: Low Risk    Difficulty of Paying Living Expenses: Not hard at all  Food Insecurity: No Food Insecurity   Worried About Charity fundraiser in the Last Year: Never true   Ran Out of Food in the Last Year: Never true  Housing: Low Risk    Last Housing Risk Score: 0  Physical Activity: Sufficiently Active   Days of Exercise per Week: 3 days   Minutes of Exercise per Session: 60 min  Social Connections: Not on file  Stress: No Stress Concern Present   Feeling of Stress : Only a little  Tobacco Use: Low Risk    Smoking Tobacco Use: Never   Smokeless Tobacco Use: Never   Passive Exposure: Not on file  Transportation Needs: No Transportation Needs   Lack of Transportation (Medical): No   Lack of Transportation (Non-Medical): No    CCM Care Plan  No Known Allergies  Medications Reviewed Today     Reviewed by Kellie Simmering, LPN (Licensed Practical Nurse) on 06/12/21 at [place]  Med List Status: <None>   Medication Order Taking? Sig Documenting Provider Last Dose Status Informant  acetaminophen (TYLENOL) 500 MG tablet [id number] Yes Take 500 mg by mouth daily as needed for moderate pain or headache. [provider] Taking Active Self  amLODipine (NORVASC) 5 MG tablet  [id number] Yes Take 1 tablet by mouth once daily Glendale Chard, MD Taking Active   aspirin EC 81 MG tablet [id number] Yes Take 81 mg by mouth daily. [provider] Taking Active Self  Blood Glucose Monitoring Suppl (ONE TOUCH ULTRA 2) w/Device KIT [id number] Yes Use to check blood sugar 3 times a day. Dx code e11.65 Glendale Chard, MD Taking Active   calcium carbonate (OSCAL) 1500 (600 Ca) MG TABS tablet [id number] Yes Take 600 mg of elemental calcium by mouth daily. [provider] Taking Active Self  Carboxymethylcellul-Glycerin (LUBRICATING EYE DROPS OP) [id number] Yes Place 1 drop into both eyes 2 (two) times daily. [provider] Taking Active Self  hydrochlorothiazide (HYDRODIURIL) 25 MG tablet [id number] Yes Take 1 tablet by mouth once daily Glendale Chard, MD Taking Active   Magnesium 250 MG TABS [id number] Yes Matilde Haymaker, MD Taking Active   metFORMIN (GLUCOPHAGE-XR) 500 MG 24 hr tablet [id number] Yes Take 1 tablet by mouth twice daily Glendale Chard, MD Taking Active   Multiple Vitamin (MULTIVITAMIN WITH MINERALS) TABS tablet [id number] Yes Take 1 tablet by mouth daily. [provider] Taking Active Self  olmesartan (BENICAR) 40 MG tablet [id number] Yes Take 1 tablet by mouth once daily Glendale Chard, MD Taking Active   OneTouch Delica Lancets 65K [place] [id number] Yes USE AS DIRECTED TO CHECK BLOOD SUGARS ONCE DAILY Glendale Chard, MD Taking Active   [facility] ULTRA test strip [id number] Yes USE AS INSTRUCTED TO CHECK BLOOD SUGARS ONE TIME PER DAY Glendale Chard, MD Taking Active   Pitavastatin Calcium (LIVALO) 2 MG TABS [id number] Yes Take 1 tablet (2 mg total) by mouth daily. Glendale Chard, MD Taking Active   SYNTHROID 112 MCG tablet [id number] Yes TAKE 1 TABLET BY MOUTH ONCE DAILY MONDAY-SATURDAY  AND  ONE  HALF  TABLET  ON  Antony Odea, MD Taking Active   TURMERIC PO [id number] Yes Take 500 mg by mouth daily at 6 (six) AM.  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 02/27/2020   Hypothyroidism 02/27/2020   Other long term (current) drug therapy 02/27/2020   Overweight with body mass index (BMI) of 27 to 27.9 in adult 12/26/2019   Vitamin D deficiency disease 12/26/2019   Pure hypercholesterolemia 03/29/2019   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   Overweight (BMI 25.0-29.9) 03/29/2019   Right wrist pain 09/30/2015   Unspecified essential hypertension 12/20/2013   Other and unspecified hyperlipidemia 12/20/2013   Type 2 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 disease, without long-term current use of insulin ([place]) 12/20/2013   Neuralgia 12/20/2013   Neck pain 12/20/2013   Dizziness 12/20/2013    Immunization History  Administered Date(s) Administered   Fluad Quad(high Dose 65+) 02/13/2020   Influenza, High Dose Seasonal PF 03/10/2018, 01/31/2019   Influenza-Unspecified 02/27/2021   PFIZER(Purple Top)SARS-COV-2 Vaccination 06/16/2019, 07/07/2019, 03/06/2020, 09/23/2020, 04/03/2021   Pneumococcal Conjugate-13 08/04/2018   Pneumococcal Polysaccharide-23 08/16/2020    Conditions to be addressed/monitored:  Hypertension, Hyperlipidemia, and Thyroid  Care Plan : [place]  Updates made by Mayford Knife, RPH since 06/18/2021 12:00 AM     Problem: HTN, HLD, Hyperthyroidism   Priority: High     Long-Range Goal: Disease Management   Note:   Current Barriers:  Unable to independently monitor therapeutic efficacy  Pharmacist Clinical Goal(s):  Patient will achieve adherence to monitoring guidelines and medication adherence to achieve therapeutic efficacy through collaboration with PharmD and provider.   Interventions: 1:1 collaboration with Glendale Chard, MD regarding development and update of comprehensive  plan of care as evidenced by provider attestation and co-signature Inter-disciplinary care team collaboration (see longitudinal plan of care) Comprehensive medication review performed; medication list updated in electronic medical record  Hypertension (BP goal <130/80) -Controlled -Current treatment: Hydrochlorothiazide 25 mg tablet once per day  Appropriate, Effective, Safe, Accessible Olmesartan 40 mg tablet by mouth daily Appropriate, Effective, Safe, Accessible Amlodipine 5 mg tablet once per day Appropriate, Effective, Safe, Accessible -Medications previously tried: checking in her left arm,  her BP is higher at home then in the office  -Current home readings: 161/76 at night 01/31/2022- 138/72  -Current dietary habits: she only eats twice per day. A piece of fruit mid day, and dinner no later than 7 pm. She does not always eat the healthiest, she tries to limit the amount of salt she is eating. She tries to buy vegetables and salad. When she eats out she is eating vegetable plates, she is eating  only half.  -She is eating out a lot because they don't cook that often.  -Current exercise habits: please see hyperlipidemia  -Denies hypotensive/hypertensive symptoms -Educated on Daily salt intake goal < 2300 mg; Exercise goal of 150 minutes per week; -Counseled to monitor BP at home at least three times per week, document, and provide log at future appointments -Recommended to continue current medication   Hyperlipidemia: (LDL goal < 70) -Controlled -Current medications: Aspirin 81 mg tablet once per day Appropriate, Effective, Safe, Accessible Pitavastatin 2 mg tablet once per day -patient reports having a 30 day supply  Appropriate, Effective, Safe, Query Accessible Taking it Monday through Friday  Costing 90 dollars for a three month supply  -Medications previously tried: she recalls having   -Current  dietary patterns: she is not eating as healthy as she would like because she is  eating out.  -Current exercise habits: she is walking three days per week for at least 45 minutes to an hour. She has a route that she takes.  -Patient reports that Ms. Buckman is open taking Atorvastatin three times per week if Dr. Baird Cancer is agreeable  -Educated on Cholesterol goals;  Benefits of statin for ASCVD risk reduction; -Collaborated with PCP team to change patients medication to Atorvastatin 20 mg tablet three days per week. Will discontinue Livalo for now. Patient to have assessment call in two weeks from [facility] to check on how patient is doing.   Hypothyroidism (Goal: with in normal limits) -Controlled -Current treatment  Synthroid 112 mcg once per day - 90 day supply of medication costs 90 dollars, patient can afford the medication but would like to have a more inexpensive option.  Appropriate, Effective, Safe, Query Accessible -Discussed patients options to decrease the cost of her medication.  -Send prescription for Synthroid delivers program  -Recommended to continue current medication   Patient Goals/Self-Care Activities Patient will:  - take medications as prescribed as evidenced by patient report and record review collaborate with provider on medication access solutions  Follow Up Plan: The patient has been provided with contact information for the care management team and has been advised to call with any health related questions or concerns.       Medication Assistance: None required.  Patient affirms current coverage meets needs.  Compliance/Adherence/Medication fill history: Care Gaps: Shingrix Vaccine COVID-19 Vaccine Booster  Star-Rating Drugs: Livalo 2 mg tablet  Metformin XR 500 mg tablet  Olmesartan 40 mg tablet   Patient's preferred pharmacy is:  [facility]  [id number] [place], [place] - [address] [address] Phone: [phone] Fax: [place], [place] - [place] [address] Phone: [phone] Fax: [phone]  [facility] - [place], [place] - [address] [place] [address] Phone: [phone] Fax: [phone]  Uses pill box? Yes Pt endorses 95% compliance  We discussed: Benefits of medication synchronization, packaging and delivery as well as enhanced pharmacist oversight with [employer]. Patient decided to: Continue current medication management strategy  Care Plan and Follow Up Patient Decision:  Patient agrees to Care Plan and Follow-up.  Plan: The patient has been provided with contact information for the care management team and has been advised to call with any health related questions or concerns.   Orlando Penner, CPP, PharmD Clinical Pharmacist Practitioner [facility] [phone]

## 2021-06-12 ENCOUNTER — Ambulatory Visit (INDEPENDENT_AMBULATORY_CARE_PROVIDER_SITE_OTHER): Payer: PPO

## 2021-06-12 VITALS — Ht 65.0 in | Wt 152.0 lb

## 2021-06-12 DIAGNOSIS — Z0000 Encounter for general adult medical examination without abnormal findings: Secondary | ICD-10-CM

## 2021-06-12 NOTE — Progress Notes (Signed)
I connected with Brooke Brown today by telephone and verified that I am speaking with the correct person using two identifiers. Location patient: home Location provider: work Persons participating in the virtual visit: Brooke Brown, Brooke Durand LPN.   I discussed the limitations, risks, security and privacy concerns of performing an evaluation and management service by telephone and the availability of in person appointments. I also discussed with the patient that there may be a patient responsible charge related to this service. The patient expressed understanding and verbally consented to this telephonic visit.    Interactive audio and video telecommunications were attempted between this provider and patient, however failed, due to patient having technical difficulties OR patient did not have access to video capability.  We continued and completed visit with audio only.     Vital signs may be patient reported or missing.  Subjective:   Brooke Brown is a 71 y.o. female who presents for Medicare Annual (Subsequent) preventive examination.  Review of Systems     Cardiac Risk Factors include: advanced age (>54mn, >>87women);diabetes mellitus;dyslipidemia;hypertension     Objective:    Today's Vitals   06/12/21 0946 06/12/21 0947  Weight: 152 lb (68.9 kg)   Height: _0  (1.651 m)   PainSc:  3    Body mass index is 25.29 kg/m.  Advanced Directives 06/12/2021 05/28/2021 08/29/2020 08/10/2019 08/03/2018 08/09/2017  Does Patient Have a Medical Advance Directive? No No Yes No Yes Yes  Type of Advance Directive - - Living will - Living will Living will  Does patient want to make changes to medical advance directive? - - - - - No - Patient declined  Would patient like information on creating a medical advance directive? - Yes (MAU/Ambulatory/Procedural Areas - Information given) - No - Patient declined - -    Current Medications (verified) Outpatient Encounter  Medications as of 06/12/2021  Medication Sig   acetaminophen (TYLENOL) 500 MG tablet Take 500 mg by mouth daily as needed for moderate pain or headache.   amLODipine (NORVASC) 5 MG tablet Take 1 tablet by mouth once daily   aspirin EC 81 MG tablet Take 81 mg by mouth daily.   Blood Glucose Monitoring Suppl (ONE TOUCH ULTRA 2) w/Device KIT Use to check blood sugar 3 times a day. Dx code e11.65   calcium carbonate (OSCAL) 1500 (600 Ca) MG TABS tablet Take 600 mg of elemental calcium by mouth daily.   Carboxymethylcellul-Glycerin (LUBRICATING EYE DROPS OP) Place 1 drop into both eyes 2 (two) times daily.   hydrochlorothiazide (HYDRODIURIL) 25 MG tablet Take 1 tablet by mouth once daily   Magnesium 250 MG TABS otc   metFORMIN (GLUCOPHAGE-XR) 500 MG 24 hr tablet Take 1 tablet by mouth twice daily   Multiple Vitamin (MULTIVITAMIN WITH MINERALS) TABS tablet Take 1 tablet by mouth daily.   olmesartan (BENICAR) 40 MG tablet Take 1 tablet by mouth once daily   OneTouch Delica Lancets 351OMISC USE AS DIRECTED TO CHECK BLOOD SUGARS ONCE DAILY   ONETOUCH ULTRA test strip USE AS INSTRUCTED TO CHECK BLOOD SUGARS ONE TIME PER DAY   Pitavastatin Calcium (LIVALO) 2 MG TABS Take 1 tablet (2 mg total) by mouth daily.   SYNTHROID 112 MCG tablet TAKE 1 TABLET BY MOUTH ONCE DAILY MONDAY-SATURDAY  AND  ONE  HALF  TABLET  ON  SUNDAY   TURMERIC PO Take 500 mg by mouth daily at 6 (six) AM.   No facility-administered encounter medications on file as of  06/12/2021.    Allergies (verified) Patient has no known allergies.   History: Past Medical History:  Diagnosis Date   Anxiety    Arthritis    Diabetes mellitus    Grave's disease    Grave's disease    Hyperlipemia    Hypertension    Past Surgical History:  Procedure Laterality Date   ABDOMINAL HYSTERECTOMY     CATARACT EXTRACTION W/ INTRAOCULAR LENS  IMPLANT, BILATERAL Bilateral    CHOLECYSTECTOMY N/A 08/16/2017   Procedure: LAPAROSCOPIC CHOLECYSTECTOMY;   Surgeon: Judeth Horn, MD;  Location: [facility];  Service: General;  Laterality: N/A;   TUBAL LIGATION     Family History  Problem Relation Age of Onset   Pancreatic cancer Mother    Heart failure Father    Diabetes Brother    Breast cancer Cousin    Social History   Socioeconomic History   Marital status: Married    Spouse name: Not on file   Number of children: Not on file   Years of education: Not on file   Highest education level: Not on file  Occupational History   Occupation: retired  Tobacco Use   Smoking status: Never   Smokeless tobacco: Never  Vaping Use   Vaping Use: Never used  Substance and Sexual Activity   Alcohol use: Not Currently    Alcohol/week: 0.0 standard drinks    Comment: rarely   Drug use: No   Sexual activity: Not Currently  Other Topics Concern   Not on file  Social History Narrative   Not on file   Social Determinants of Health   Financial Resource Strain: Low Risk    Difficulty of Paying Living Expenses: Not hard at all  Food Insecurity: No Food Insecurity   Worried About Charity fundraiser in the Last Year: Never true   [place] in the Last Year: Never true  Transportation Needs: No Transportation Needs   Lack of Transportation (Medical): No   Lack of Transportation (Non-Medical): No  Physical Activity: Sufficiently Active   Days of Exercise per Week: 3 days   Minutes of Exercise per Session: 60 min  Stress: No Stress Concern Present   Feeling of Stress : Only a little  Social Connections: Not on file    Tobacco Counseling Counseling given: Not Answered   Clinical Intake:  Pre-visit preparation completed: Yes  Pain : 0-10 Pain Score: 3  Pain Type: Chronic pain Pain Location:  (neck and thumbs) Pain Descriptors / Indicators: Aching Pain Frequency: Constant     Nutritional Status: BMI 25 -29 Overweight Nutritional Risks: None Diabetes: Yes  How often do you need to have someone help you when you read  instructions, pamphlets, or other written materials from your doctor or pharmacy?: 1 - Never What is the last grade level you completed in school?: 55yrcollege  Diabetic? Yes Nutrition Risk Assessment:  Has the patient had any N/V/D within the last 2 months?  No  Does the patient have any non-healing wounds?  No  Has the patient had any unintentional weight loss or weight gain?  No   Diabetes:  Is the patient diabetic?  Yes  If diabetic, was a CBG obtained today?  No  Did the patient bring in their glucometer from home?  No  How often do you monitor your CBG's? daily.   Financial Strains and Diabetes Management:  Are you having any financial strains with the device, your supplies or your medication? No .  Does the patient want to be seen by Chronic Care Management for management of their diabetes?  No  Would the patient like to be referred to a Nutritionist or for Diabetic Management?  No   Diabetic Exams:  Diabetic Eye Exam: Completed 06/26/2020 Diabetic Foot Exam: Completed 12/31/2020   Interpreter Needed?: No  Information entered by :: NAllen LPN   Activities of Daily Living In your present state of health, do you have any difficulty performing the following activities: 06/12/2021 06/11/2021  Hearing? N N  Vision? N N  Difficulty concentrating or making decisions? N N  Walking or climbing stairs? N N  Dressing or bathing? N N  Doing errands, shopping? N N  Preparing Food and eating ? N N  Using the Toilet? N N  In the past six months, have you accidently leaked urine? Y Y  Comment if held too long -  Do you have problems with loss of bowel control? N N  Managing your Medications? N N  Managing your Finances? N N  Housekeeping or managing your Housekeeping? N N  Some recent data might be hidden    Patient Care Team: Glendale Chard, MD as PCP - General (Internal Medicine) Rex Kras, Claudette Stapler, RN as Case Manager Mayford Knife, [facility] (Pharmacist)  Indicate any recent  Medical Services you may have received from other than [facility] providers in the past year (date may be approximate).     Assessment:   This is a routine wellness examination for Brooke Brown.  Hearing/Vision screen Vision Screening - Comments:: Regular eye exams, [facility]  Dietary issues and exercise activities discussed: Current Exercise Habits: Home exercise routine, Type of exercise: walking, Time (Minutes): 60, Frequency (Times/Week): 3, Weekly Exercise (Minutes/Week): 180   Goals Addressed             This Visit's Progress    Patient Stated       06/12/2021, wants to cut back eating out and decrease diabetes medication       Depression Screen PHQ 2/9 Scores 06/12/2021 08/29/2020 12/25/2019 08/10/2019 03/07/2019 12/19/2018 08/03/2018  PHQ - 2 Score 0 0 0 0 0 0 0  PHQ- 9 Score - - - 2 - - -    Fall Risk Fall Risk  06/12/2021 06/11/2021 08/29/2020 08/15/2020 08/10/2019  Falls in the past year? 1 1 0 0 0  Comment tripped on uneven ground - - - -  Number falls in past yr: 0 0 - - -  Injury with Fall? 1 1 - - -  Comment lip busted and chin bruise - - - -  Risk for fall due to : Medication side effect - Medication side effect - Medication side effect  Follow up Falls evaluation completed;Education provided;Falls prevention discussed - Falls evaluation completed;Education provided;Falls prevention discussed - Falls evaluation completed;Education provided;Falls prevention discussed    FALL RISK PREVENTION PERTAINING TO THE HOME:  Any stairs in or around the home? Yes  If so, are there any without handrails? No  Home free of loose throw rugs in walkways, pet beds, electrical cords, etc? Yes  Adequate lighting in your home to reduce risk of falls? Yes   ASSISTIVE DEVICES UTILIZED TO PREVENT FALLS:  Life alert? No  Use of a cane, walker or w/c? No  Grab bars in the bathroom? Yes  Shower chair or bench in shower? Yes  Elevated toilet seat or a handicapped toilet? Yes   TIMED UP  AND GO:  Was the test performed? No .  Cognitive Function:     6CIT Screen 06/12/2021 08/29/2020 08/10/2019 08/03/2018  What Year? 0 points 0 points 0 points 0 points  What month? 0 points 0 points 0 points 0 points  What time? 0 points 0 points 0 points 0 points  Count back from 20 0 points 0 points 0 points 0 points  Months in reverse 0 points 0 points 0 points 0 points  Repeat phrase 0 points 0 points 2 points 0 points  Total Score 0 0 2 0    Immunizations Immunization History  Administered Date(s) Administered   Fluad Quad(high Dose 65+) 02/13/2020   Influenza, High Dose Seasonal PF 03/10/2018, 01/31/2019   Influenza-Unspecified 02/27/2021   PFIZER(Purple Top)SARS-COV-2 Vaccination 06/16/2019, 07/07/2019, 03/06/2020, 09/23/2020, 04/03/2021   Pneumococcal Conjugate-13 08/04/2018   Pneumococcal Polysaccharide-23 08/16/2020    TDAP status: Up to date  Flu Vaccine status: Up to date  Pneumococcal vaccine status: Up to date  Covid-19 vaccine status: Completed vaccines  Qualifies for Shingles Vaccine? Yes   Zostavax completed No   Shingrix Completed?: No.    Education has been provided regarding the importance of this vaccine. Patient has been advised to call insurance company to determine out of pocket expense if they have not yet received this vaccine. Advised may also receive vaccine at local pharmacy or Health Dept. Verbalized acceptance and understanding.  Screening Tests Health Maintenance  Topic Date Due   Zoster Vaccines- Shingrix (1 of 2) Never done   COVID-19 Vaccine (6 - Booster for Pfizer series) 05/29/2021   OPHTHALMOLOGY EXAM  06/26/2021   HEMOGLOBIN A1C  11/03/2021   FOOT EXAM  12/31/2021   TETANUS/TDAP  01/20/2022   MAMMOGRAM  02/27/2023   COLONOSCOPY (Pts 45-43yr Insurance coverage will need to be confirmed)  11/08/2028   Pneumonia Vaccine 71 Years old  Completed   INFLUENZA VACCINE  Completed   DEXA SCAN  Completed   Hepatitis C Screening   Completed   HPV VACCINES  Aged Out    Health Maintenance  Health Maintenance Due  Topic Date Due   Zoster Vaccines- Shingrix (1 of 2) Never done   COVID-19 Vaccine (6 - Booster for PPendletonseries) 05/29/2021    Colorectal cancer screening: Type of screening: Colonoscopy. Completed 11/09/2018. Repeat every 10 years  Mammogram status: Completed 02/26/2021. Repeat every year  Bone Density status: Completed 02/09/2018  Lung Cancer Screening: (Low Dose CT Chest recommended if Age 304-80years, 30 pack-year currently smoking OR have quit w/in 15years.) does not qualify.   Lung Cancer Screening Referral: no  Additional Screening:  Hepatitis C Screening: does qualify; Completed 01/15/2012  Vision Screening: Recommended annual ophthalmology exams for early detection of glaucoma and other disorders of the eye. Is the patient up to date with their annual eye exam?  Yes  Who is the provider or what is the name of the office in which the patient attends annual eye exams? [facility] If pt is not established with a provider, would they like to be referred to a provider to establish care? No .   Dental Screening: Recommended annual dental exams for proper oral hygiene  Community Resource Referral / Chronic Care Management: CRR required this visit?  No   CCM required this visit?  No      Plan:     I have personally reviewed and noted the following in the patients chart:   Medical and social history Use of alcohol, tobacco or illicit drugs  Current medications and supplements including opioid  prescriptions.  Functional ability and status Nutritional status Physical activity Advanced directives List of other physicians Hospitalizations, surgeries, and ER visits in previous 12 months Vitals Screenings to include cognitive, depression, and falls Referrals and appointments  In addition, I have reviewed and discussed with patient certain preventive protocols, quality metrics,  and best practice recommendations. A written personalized care plan for preventive services as well as general preventive health recommendations were provided to patient.     Kellie Simmering, LPN   [id number]   Nurse Notes: none

## 2021-06-12 NOTE — Patient Instructions (Signed)
[name] , Thank you for taking time to come for your Medicare Wellness Visit. I appreciate your ongoing commitment to your health goals. Please review the following plan we discussed and let me know if I can assist you in the future.   Screening recommendations/referrals: Colonoscopy: completed 11/09/2018 Mammogram: completed 02/26/2021 Bone Density: completed 02/09/2018 Recommended yearly ophthalmology/optometry visit for glaucoma screening and checkup Recommended yearly dental visit for hygiene and checkup  Vaccinations: Influenza vaccine: completed 02/27/2021 Pneumococcal vaccine: completed 08/16/2020 Tdap vaccine: completed 01/21/2012, due 01/20/2022 Shingles vaccine: discussed   Covid-19: 04/03/2021, 09/23/2020, 03/06/2020, 07/07/2019, 06/16/2019  Advanced directives: Advance directive discussed with you today.   Conditions/risks identified: none  Next appointment: Follow up in one year for your annual wellness visit    Preventive Care 65 Years and Older, Female Preventive care refers to lifestyle choices and visits with your health care provider that can promote health and wellness. What does preventive care include? A yearly physical exam. This is also called an annual well check. Dental exams once or twice a year. Routine eye exams. Ask your health care provider how often you should have your eyes checked. Personal lifestyle choices, including: Daily care of your teeth and gums. Regular physical activity. Eating a healthy diet. Avoiding tobacco and drug use. Limiting alcohol use. Practicing safe sex. Taking low-dose aspirin every day. Taking vitamin and mineral supplements as recommended by your health care provider. What happens during an annual well check? The services and screenings done by your health care provider during your annual well check will depend on your age, overall health, lifestyle risk factors, and family history of disease. Counseling  Your health care  provider may ask you questions about your: Alcohol use. Tobacco use. Drug use. Emotional well-being. Home and relationship well-being. Sexual activity. Eating habits. History of falls. Memory and ability to understand (cognition). Work and work Statistician. Reproductive health. Screening  You may have the following tests or measurements: Height, weight, and BMI. Blood pressure. Lipid and cholesterol levels. These may be checked every 5 years, or more frequently if you are over 17 years old. Skin check. Lung cancer screening. You may have this screening every year starting at age 95 if you have a 30-pack-year history of smoking and currently smoke or have quit within the past 15 years. Fecal occult blood test (FOBT) of the stool. You may have this test every year starting at age 22. Flexible sigmoidoscopy or colonoscopy. You may have a sigmoidoscopy every 5 years or a colonoscopy every 10 years starting at age 21. Hepatitis C blood test. Hepatitis B blood test. Sexually transmitted disease (STD) testing. Diabetes screening. This is done by checking your blood sugar (glucose) after you have not eaten for a while (fasting). You may have this done every 1-3 years. Bone density scan. This is done to screen for osteoporosis. You may have this done starting at age 4. Mammogram. This may be done every 1-2 years. Talk to your health care provider about how often you should have regular mammograms. Talk with your health care provider about your test results, treatment options, and if necessary, the need for more tests. Vaccines  Your health care provider may recommend certain vaccines, such as: Influenza vaccine. This is recommended every year. Tetanus, diphtheria, and acellular pertussis (Tdap, Td) vaccine. You may need a Td booster every 10 years. Zoster vaccine. You may need this after age 69. Pneumococcal 13-valent conjugate (PCV13) vaccine. One dose is recommended after age  61. Pneumococcal polysaccharide (PPSV23)  vaccine. One dose is recommended after age 51. Talk to your health care provider about which screenings and vaccines you need and how often you need them. This information is not intended to replace advice given to you by your health care provider. Make sure you discuss any questions you have with your health care provider. Document Released: 06/07/2015 Document Revised: 01/29/2016 Document Reviewed: 03/12/2015 Elsevier Interactive Patient Education  2017 [place] Prevention in the Home Falls can cause injuries. They can happen to people of all ages. There are many things you can do to make your home safe and to help prevent falls. What can I do on the outside of my home? Regularly fix the edges of walkways and driveways and fix any cracks. Remove anything that might make you trip as you walk through a door, such as a raised step or threshold. Trim any bushes or trees on the path to your home. Use bright outdoor lighting. Clear any walking paths of anything that might make someone trip, such as rocks or tools. Regularly check to see if handrails are loose or broken. Make sure that both sides of any steps have handrails. Any raised decks and porches should have guardrails on the edges. Have any leaves, snow, or ice cleared regularly. Use sand or salt on walking paths during winter. Clean up any spills in your garage right away. This includes oil or grease spills. What can I do in the bathroom? Use night lights. Install grab bars by the toilet and in the tub and shower. Do not use towel bars as grab bars. Use non-skid mats or decals in the tub or shower. If you need to sit down in the shower, use a plastic, non-slip stool. Keep the floor dry. Clean up any water that spills on the floor as soon as it happens. Remove soap buildup in the tub or shower regularly. Attach bath mats securely with double-sided non-slip rug tape. Do not have throw  rugs and other things on the floor that can make you trip. What can I do in the bedroom? Use night lights. Make sure that you have a light by your bed that is easy to reach. Do not use any sheets or blankets that are too big for your bed. They should not hang down onto the floor. Have a firm chair that has side arms. You can use this for support while you get dressed. Do not have throw rugs and other things on the floor that can make you trip. What can I do in the kitchen? Clean up any spills right away. Avoid walking on wet floors. Keep items that you use a lot in easy-to-reach places. If you need to reach something above you, use a strong step stool that has a grab bar. Keep electrical cords out of the way. Do not use floor polish or wax that makes floors slippery. If you must use wax, use non-skid floor wax. Do not have throw rugs and other things on the floor that can make you trip. What can I do with my stairs? Do not leave any items on the stairs. Make sure that there are handrails on both sides of the stairs and use them. Fix handrails that are broken or loose. Make sure that handrails are as long as the stairways. Check any carpeting to make sure that it is firmly attached to the stairs. Fix any carpet that is loose or worn. Avoid having throw rugs at the top or bottom of  the stairs. If you do have throw rugs, attach them to the floor with carpet tape. Make sure that you have a light switch at the top of the stairs and the bottom of the stairs. If you do not have them, ask someone to add them for you. What else can I do to help prevent falls? Wear shoes that: Do not have high heels. Have rubber bottoms. Are comfortable and fit you well. Are closed at the toe. Do not wear sandals. If you use a stepladder: Make sure that it is fully opened. Do not climb a closed stepladder. Make sure that both sides of the stepladder are locked into place. Ask someone to hold it for you, if  possible. Clearly mark and make sure that you can see: Any grab bars or handrails. First and last steps. Where the edge of each step is. Use tools that help you move around (mobility aids) if they are needed. These include: Canes. Walkers. Scooters. Crutches. Turn on the lights when you go into a dark area. Replace any light bulbs as soon as they burn out. Set up your furniture so you have a clear path. Avoid moving your furniture around. If any of your floors are uneven, fix them. If there are any pets around you, be aware of where they are. Review your medicines with your doctor. Some medicines can make you feel dizzy. This can increase your chance of falling. Ask your doctor what other things that you can do to help prevent falls. This information is not intended to replace advice given to you by your health care provider. Make sure you discuss any questions you have with your health care provider. Document Released: 03/07/2009 Document Revised: 10/17/2015 Document Reviewed: 06/15/2014 Elsevier Interactive Patient Education  2017 [employer].

## 2021-06-17 ENCOUNTER — Telehealth: Payer: Self-pay

## 2021-06-17 NOTE — Chronic Care Management (AMB) (Signed)
° ° °  Chronic Care Management Pharmacy Assistant   Name: Brooke Brown  MRN: [id number] DOB: June 02, 1950  Reason for Encounter: Patient assistance for Livalo  Medications: Outpatient Encounter Medications as of 06/17/2021  Medication Sig   acetaminophen (TYLENOL) 500 MG tablet Take 500 mg by mouth daily as needed for moderate pain or headache.   amLODipine (NORVASC) 5 MG tablet Take 1 tablet by mouth once daily   aspirin EC 81 MG tablet Take 81 mg by mouth daily.   Blood Glucose Monitoring Suppl (ONE TOUCH ULTRA 2) w/Device KIT Use to check blood sugar 3 times a day. Dx code e11.65   calcium carbonate (OSCAL) 1500 (600 Ca) MG TABS tablet Take 600 mg of elemental calcium by mouth daily.   Carboxymethylcellul-Glycerin (LUBRICATING EYE DROPS OP) Place 1 drop into both eyes 2 (two) times daily.   hydrochlorothiazide (HYDRODIURIL) 25 MG tablet Take 1 tablet by mouth once daily   Magnesium 250 MG TABS otc   metFORMIN (GLUCOPHAGE-XR) 500 MG 24 hr tablet Take 1 tablet by mouth twice daily   Multiple Vitamin (MULTIVITAMIN WITH MINERALS) TABS tablet Take 1 tablet by mouth daily.   olmesartan (BENICAR) 40 MG tablet Take 1 tablet by mouth once daily   OneTouch Delica Lancets 07H MISC USE AS DIRECTED TO CHECK BLOOD SUGARS ONCE DAILY   ONETOUCH ULTRA test strip USE AS INSTRUCTED TO CHECK BLOOD SUGARS ONE TIME PER DAY   Pitavastatin Calcium (LIVALO) 2 MG TABS Take 1 tablet (2 mg total) by mouth daily.   SYNTHROID 112 MCG tablet TAKE 1 TABLET BY MOUTH ONCE DAILY MONDAY-SATURDAY  AND  ONE  HALF  TABLET  ON  SUNDAY   TURMERIC PO Take 500 mg by mouth daily at 6 (six) AM.   No facility-administered encounter medications on file as of 06/17/2021.   [id number]: Started application for livalo to mail and for patient to return with income.  [place] Pharmacist Assistant [phone]

## 2021-06-17 NOTE — Patient Instructions (Addendum)
Visit Information It was great speaking with you today!  Please let me know if you have any questions about our visit.   Goals Addressed             This Visit's Progress    Manage My Medicine       Timeframe:  Long-Range Goal Priority:  High Start Date:                             Expected End Date:                       Follow Up Date 09/30/2021    - call for medicine refill 2 or 3 days before it runs out - keep a list of all the medicines I take; vitamins and herbals too    Why is this important?   These steps will help you keep on track with your medicines.   Notes:  -Please call if you have any questions about your medications        Problem: HTN, HLD, Hyperthyroidism   Priority: High     Long-Range Goal: Disease Management   Note:   Current Barriers:  Unable to independently monitor therapeutic efficacy  Pharmacist Clinical Goal(s):  Patient will achieve adherence to monitoring guidelines and medication adherence to achieve therapeutic efficacy through collaboration with PharmD and provider.   Interventions: 1:1 collaboration with Glendale Chard, MD regarding development and update of comprehensive plan of care as evidenced by provider attestation and co-signature Inter-disciplinary care team collaboration (see longitudinal plan of care) Comprehensive medication review performed; medication list updated in electronic medical record  Hypertension (BP goal <130/80) -Controlled -Current treatment: Hydrochlorothiazide 25 mg tablet once per day  Appropriate, Effective, Safe, Accessible Olmesartan 40 mg tablet by mouth daily Appropriate, Effective, Safe, Accessible Amlodipine 5 mg tablet once per day Appropriate, Effective, Safe, Accessible -Medications previously tried: checking in her left arm,  her BP is higher at home then in the office  -Current home readings: 161/76 at night 01/31/2022- 138/72  -Current dietary habits: she only eats twice per day. A piece  of fruit mid day, and dinner no later than 7 pm. She does not always eat the healthiest, she tries to limit the amount of salt she is eating. She tries to buy vegetables and salad. When she eats out she is eating vegetable plates, she is eating  only half.  -She is eating out a lot because they don't cook that often.  -Current exercise habits: please see hyperlipidemia  -Denies hypotensive/hypertensive symptoms -Educated on Daily salt intake goal < 2300 mg; Exercise goal of 150 minutes per week; -Counseled to monitor BP at home at least three times per week, document, and provide log at future appointments -Recommended to continue current medication   Hyperlipidemia: (LDL goal < 70) -Controlled -Current medications: Aspirin 81 mg tablet once per day Appropriate, Effective, Safe, Accessible Pitavastatin 2 mg tablet once per day -patient reports having a 30 day supply  Appropriate, Effective, Safe, Query Accessible Taking it Monday through Friday  Costing 90 dollars for a three month supply  -Medications previously tried: she recalls having   -Current dietary patterns: she is not eating as healthy as she would like because she is eating out.  -Current exercise habits: she is walking three days per week for at least 45 minutes to an hour. She has a route that she takes.  -Patient reports that  Ms. Markham is open taking Atorvastatin three times per week if Dr. Baird Cancer is agreeable  -Educated on Cholesterol goals;  Benefits of statin for ASCVD risk reduction; -Collaborated with PCP team to change patients medication to Atorvastatin 20 mg tablet three days per week. Will discontinue Livalo for now. Patient to have assessment call in two weeks from [facility] to check on how patient is doing with her cholesterol medication   Hypothyroidism (Goal: with in normal limits) -Controlled -Current treatment  Synthroid 112 mcg once per day - 90 day supply of medication costs 90 dollars, patient can afford the  medication but would like to have a more inexpensive option.  Appropriate, Effective, Safe, Query Accessible -Discussed patients options to decrease the cost of her medication.  -Send prescription for Synthroid delivers program  -Recommended to continue current medication   Patient Goals/Self-Care Activities Patient will:  - take medications as prescribed as evidenced by patient report and record review collaborate with provider on medication access solutions  Follow Up Plan: The patient has been provided with contact information for the care management team and has been advised to call with any health related questions or concerns.        Ms. Mathwig was given information about Chronic Care Management services today including:  CCM service includes personalized support from designated clinical staff supervised by her physician, including individualized plan of care and coordination with other care providers 24/7 contact phone numbers for assistance for urgent and routine care needs. Standard insurance, coinsurance, copays and deductibles apply for chronic care management only during months in which we provide at least 20 minutes of these services. Most insurances cover these services at 100%, however patients may be responsible for any copay, coinsurance and/or deductible if applicable. This service may help you avoid the need for more expensive face-to-face services. Only one practitioner may furnish and bill the service in a calendar month. The patient may stop CCM services at any time (effective at the end of the month) by phone call to the office staff.  Patient agreed to services and verbal consent obtained.   The patient verbalized understanding of instructions, educational materials, and care plan provided today and agreed to receive a mailed copy of patient instructions, educational materials, and care plan.   Orlando Penner, PharmD Clinical Pharmacist Practitioner  [facility] [phone]

## 2021-06-18 MED ORDER — SYNTHROID 112 MCG PO TABS: ORAL_TABLET | ORAL | 1 refills | Status: DC

## 2021-06-18 MED ORDER — ATORVASTATIN CALCIUM 20 MG PO TABS: 20.0000 mg | ORAL_TABLET | ORAL | 0 refills | Status: DC

## 2021-06-24 ENCOUNTER — Telehealth: Payer: PPO

## 2021-06-24 ENCOUNTER — Ambulatory Visit: Payer: Self-pay

## 2021-06-24 DIAGNOSIS — E890 Postprocedural hypothyroidism: Secondary | ICD-10-CM

## 2021-06-24 DIAGNOSIS — I129 Hypertensive chronic kidney disease with stage 1 through stage 4 chronic kidney disease, or unspecified chronic kidney disease: Secondary | ICD-10-CM | POA: Diagnosis not present

## 2021-06-24 DIAGNOSIS — E782 Mixed hyperlipidemia: Secondary | ICD-10-CM | POA: Diagnosis not present

## 2021-06-24 DIAGNOSIS — E1122 Type 2 diabetes mellitus with diabetic chronic kidney disease: Secondary | ICD-10-CM | POA: Diagnosis not present

## 2021-06-24 DIAGNOSIS — N182 Chronic kidney disease, stage 2 (mild): Secondary | ICD-10-CM

## 2021-06-24 DIAGNOSIS — E039 Hypothyroidism, unspecified: Secondary | ICD-10-CM

## 2021-06-25 ENCOUNTER — Ambulatory Visit (INDEPENDENT_AMBULATORY_CARE_PROVIDER_SITE_OTHER): Payer: PPO

## 2021-06-25 DIAGNOSIS — I129 Hypertensive chronic kidney disease with stage 1 through stage 4 chronic kidney disease, or unspecified chronic kidney disease: Secondary | ICD-10-CM

## 2021-06-25 DIAGNOSIS — E1122 Type 2 diabetes mellitus with diabetic chronic kidney disease: Secondary | ICD-10-CM

## 2021-06-25 DIAGNOSIS — E782 Mixed hyperlipidemia: Secondary | ICD-10-CM

## 2021-06-25 DIAGNOSIS — N182 Chronic kidney disease, stage 2 (mild): Secondary | ICD-10-CM

## 2021-06-25 NOTE — Chronic Care Management (AMB) (Signed)
Chronic Care Management   CCM RN Visit Note  06/24/2021 Name: Brooke Brown MRN: [id number] DOB: 04/25/1951  Subjective: Brooke Brown is a 71 y.o. year old female who is a primary care patient of Glendale Chard, MD. The care management team was consulted for assistance with disease management and care coordination needs.    Engaged with patient by telephone for initial visit in response to provider referral for case management and/or care coordination services.   Consent to Services:  The patient was given information about Chronic Care Management services, agreed to services, and gave verbal consent prior to initiation of services.  Please see initial visit note for detailed documentation.   Patient agreed to services and verbal consent obtained.   Assessment: Review of patient past medical history, allergies, medications, health status, including review of consultants reports, laboratory and other test data, was performed as part of comprehensive evaluation and provision of chronic care management services.   SDOH (Social Determinants of Health) assessments and interventions performed:  Yes, no acute needs  CCM Care Plan  No Known Allergies  Outpatient Encounter Medications as of 06/24/2021  Medication Sig   acetaminophen (TYLENOL) 500 MG tablet Take 500 mg by mouth daily as needed for moderate pain or headache.   amLODipine (NORVASC) 5 MG tablet Take 1 tablet by mouth once daily   aspirin EC 81 MG tablet Take 81 mg by mouth daily.   atorvastatin (LIPITOR) 20 MG tablet Take 1 tablet (20 mg total) by mouth 3 (three) times a week for 36 doses.   calcium carbonate (OSCAL) 1500 (600 Ca) MG TABS tablet Take 600 mg of elemental calcium by mouth daily.   Carboxymethylcellul-Glycerin (LUBRICATING EYE DROPS OP) Place 1 drop into both eyes 2 (two) times daily.   hydrochlorothiazide (HYDRODIURIL) 25 MG tablet Take 1 tablet by mouth once daily   Magnesium 250 MG TABS otc    metFORMIN (GLUCOPHAGE-XR) 500 MG 24 hr tablet Take 1 tablet by mouth twice daily   Multiple Vitamin (MULTIVITAMIN WITH MINERALS) TABS tablet Take 1 tablet by mouth daily.   olmesartan (BENICAR) 40 MG tablet Take 1 tablet by mouth once daily   SYNTHROID 112 MCG tablet TAKE 1 TABLET BY MOUTH ONCE DAILY MONDAY-SATURDAY  AND  ONE  HALF  TABLET  ON  SUNDAY   Turmeric (QC TUMERIC COMPLEX PO) Take 500 mg by mouth daily.   TURMERIC PO Take 500 mg by mouth daily at 6 (six) AM.   Blood Glucose Monitoring Suppl (ONE TOUCH ULTRA 2) w/Device KIT Use to check blood sugar 3 times a day. Dx code X32.44   OneTouch Delica Lancets 01U MISC USE AS DIRECTED TO CHECK BLOOD SUGARS ONCE DAILY   ONETOUCH ULTRA test strip USE AS INSTRUCTED TO CHECK BLOOD SUGARS ONE TIME PER DAY   No facility-administered encounter medications on file as of 06/24/2021.    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   Overweight (BMI 25.0-29.9) 03/29/2019   Right wrist pain 09/30/2015   Unspecified essential hypertension 12/20/2013   Other and unspecified hyperlipidemia 12/20/2013   Type 2  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 disease, without long-term current use of insulin ([place]) 12/20/2013   Neuralgia 12/20/2013   Neck pain 12/20/2013   Dizziness 12/20/2013    Conditions to be addressed/monitored: Type 2 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 disease, without long-term current use of insulin, Benign hypertensive renal disease,Postablative hypothyroidism  Care Plan : RN Care Manager Plan of  Care  Updates made by Lynne Logan, RN since 06/24/2021 12:00 AM     Problem: No plan established with management of chronic disease states (Type 2 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 disease, without long-term current use of insulin, Benign hypertensive renal disease,Postablative hypothyroidism)   Priority: High     Long-Range Goal: Establishment of plan of care for management of chronic disease states (Type 2 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 disease, without long-term current use of insulin, Benign hypertensive renal disease,Postablative hypothyroidism)   Start Date: 05/28/2021  Expected End Date: 05/28/2022  Recent Progress: On track  Priority: High  Note:   Current Barriers:  Knowledge Deficits related to plan of care for management of Type 2 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 disease, without long-term current use of insulin, Benign hypertensive renal disease,Postablative hypothyroidism  Chronic Disease Management support and education needs related to Type 2 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 disease, without long-term current use of insulin, Benign hypertensive renal disease,Postablative hypothyroidism   RNCM Clinical Goal(s):  Patient will verbalize basic understanding of  Type 2 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 disease, without long-term current use of insulin, Benign hypertensive renal disease,Postablative hypothyroidism disease process and self health management plan as evidenced by patient will report having no disease exacerbations related to her chronic disease states as listed above take all medications exactly as prescribed and will call provider for medication related questions as evidenced by demonstrate improved understanding of prescribed medications and rationale for usage as evidenced by patient teach back demonstrate Ongoing health management independence as evidenced by patient will report 100% adherence to her  prescribed treatment plan  continue to work with RN Care Manager to address care management and care coordination needs related to  Type 2 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 disease, without long-term current use of insulin, Benign hypertensive renal disease,Postablative hypothyroidism as evidenced by adherence to CM Team Scheduled appointments demonstrate ongoing self health care management ability   as evidenced by    through collaboration with RN Care manager, provider, and care team.   Interventions: 1:1 collaboration with primary care provider regarding development and update of comprehensive plan of care as evidenced by provider attestation and co-signature Inter-disciplinary care team collaboration (see longitudinal plan of care) Evaluation of current treatment plan related to  self management and patient's adherence to plan as established by provider  Interdisciplinary Collaboration Interventions:  (Status: Goal on track:  Yes.) Long Term Goal   Collaborated with BSW to initiate plan of care to address needs related to Limited education about Advanced Directives* in patient with  Type 2 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 disease, without long-term current use of insulin, Benign hypertensive renal disease,Postablative hypothyroidism Collaboration with Glendale Chard, MD regarding development and update of comprehensive plan of care as evidenced by provider attestation and co-signature Inter-disciplinary care team collaboration    Diabetes Interventions:  (Status:  New goal.) Long Term Goal Assessed patient's understanding of A1c goal: <6.5% Provided education to patient about basic DM disease process Reviewed medications with patient  and discussed importance of medication adherence Counseled on importance of regular laboratory monitoring as prescribed Provided patient with written educational materials related to hypo and hyperglycemia and importance of correct  treatment Advised patient, providing education and rationale, to check cbg daily before meals and at bedtime and record, calling PCP and or RN CM  for findings outside established parameters Review of patient status, including review of consultants reports, relevant laboratory and other test results, and medications completed Assessed social determinant of health barriers Mailed printed educational materials related to Meal Planning; The Dangers in Skipping Meals; Mediterranean diet Discussed plans with patient for ongoing care management follow up and provided patient with direct contact information for care management team Lab Results  Component Value Date   HGBA1C 6.4 (H) 05/05/2021   Hypertension Interventions:  (Status:  New goal.) Long Term Goal Last practice recorded BP readings:  BP Readings from Last 3 Encounters:  05/05/21 130/74  12/31/20 130/68  08/15/20 126/72  Most recent eGFR/CrCl:  Lab Results  Component Value Date   EGFR 66 05/05/2021    No components found for: CRCL Evaluation of current treatment plan related to hypertension self management and patient's adherence to plan as established by provider Reviewed medications with patient and discussed importance of compliance Counseled on the importance of exercise goals with target of 150 minutes per week Advised patient, providing education and rationale, to monitor blood pressure daily and record, calling PCP for findings outside established parameters Provided education on prescribed diet low Sodium Discussed complications of poorly controlled blood pressure such as heart disease, stroke, circulatory complications, vision complications, kidney impairment, sexual dysfunction Mailed printed educational materials related to Why Should I Restrict Sodium?; How to Accurately Monitor BP at Home Discussed plans with patient for ongoing care management follow up and provided patient with direct contact information for care  management team   Patient Goals/Self-Care Activities: Take all medications as prescribed Attend all scheduled provider appointments Call pharmacy for medication refills 3-7 days in advance of running out of medications Perform all self care activities independently  Perform IADL's (shopping, preparing meals, housekeeping, managing finances) independently Call provider office for new concerns or questions  Work with the social worker to address care coordination needs and will continue to work with the clinical team to address health care and disease management related needs drink 6 to 8 glasses of water each day fill half of plate with vegetables manage portion size learn about high blood pressure take blood pressure log to all doctor appointments call doctor for signs and symptoms of high blood pressure take medications for blood pressure exactly as prescribed report new symptoms to your doctor  Follow Up Plan:  Telephone follow up appointment with care management team member scheduled for:  08/21/21     Plan:Telephone follow up appointment with care management team member scheduled for:  08/21/21  Barb Merino, RN, BSN, CCM Care Management Coordinator [place] [facility]  Direct Phone: [phone]

## 2021-06-25 NOTE — Chronic Care Management (AMB) (Signed)
Chronic Care Management    Social Work Note  06/25/2021 Name: Brooke Brown MRN: [id number] DOB: 1951-04-10  Brooke Brown is a 71 y.o. year old female who is a primary care patient of Glendale Chard, MD. The CCM team was consulted to assist the patient with chronic disease management and/or care coordination needs related to:  DM II, HLD, HTN, Social Isolation .   Engaged with patient by telephone for follow up visit in response to provider referral for social work chronic care management and care coordination services.   Consent to Services:  The patient was given information about Chronic Care Management services, agreed to services, and gave verbal consent prior to initiation of services.  Please see initial visit note for detailed documentation.   Patient agreed to services and consent obtained.   Assessment: Review of patient past medical history, allergies, medications, and health status, including review of relevant consultants reports was performed today as part of a comprehensive evaluation and provision of chronic care management and care coordination services.     SDOH (Social Determinants of Health) assessments and interventions performed:    Advanced Directives Status: See Care Plan for related entries.  CCM Care Plan  No Known Allergies  Outpatient Encounter Medications as of 06/25/2021  Medication Sig   acetaminophen (TYLENOL) 500 MG tablet Take 500 mg by mouth daily as needed for moderate pain or headache.   amLODipine (NORVASC) 5 MG tablet Take 1 tablet by mouth once daily   aspirin EC 81 MG tablet Take 81 mg by mouth daily.   atorvastatin (LIPITOR) 20 MG tablet Take 1 tablet (20 mg total) by mouth 3 (three) times a week for 36 doses.   Blood Glucose Monitoring Suppl (ONE TOUCH ULTRA 2) w/Device KIT Use to check blood sugar 3 times a day. Dx code e11.65   calcium carbonate (OSCAL) 1500 (600 Ca) MG TABS tablet Take 600 mg of elemental calcium by  mouth daily.   Carboxymethylcellul-Glycerin (LUBRICATING EYE DROPS OP) Place 1 drop into both eyes 2 (two) times daily.   hydrochlorothiazide (HYDRODIURIL) 25 MG tablet Take 1 tablet by mouth once daily   Magnesium 250 MG TABS otc   metFORMIN (GLUCOPHAGE-XR) 500 MG 24 hr tablet Take 1 tablet by mouth twice daily   Multiple Vitamin (MULTIVITAMIN WITH MINERALS) TABS tablet Take 1 tablet by mouth daily.   olmesartan (BENICAR) 40 MG tablet Take 1 tablet by mouth once daily   OneTouch Delica Lancets 01B MISC USE AS DIRECTED TO CHECK BLOOD SUGARS ONCE DAILY   ONETOUCH ULTRA test strip USE AS INSTRUCTED TO CHECK BLOOD SUGARS ONE TIME PER DAY   SYNTHROID 112 MCG tablet TAKE 1 TABLET BY MOUTH ONCE DAILY MONDAY-SATURDAY  AND  ONE  HALF  TABLET  ON  SUNDAY   Turmeric (QC TUMERIC COMPLEX PO) Take 500 mg by mouth daily.   TURMERIC PO Take 500 mg by mouth daily at 6 (six) AM.   No facility-administered encounter medications on file as of 06/25/2021.    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 02/27/2020   Hypothyroidism 02/27/2020   Other long term (current) drug therapy 02/27/2020   Overweight with body mass index (BMI) of 27 to 27.9 in adult 12/26/2019   Vitamin D deficiency disease 12/26/2019   Pure hypercholesterolemia 03/29/2019   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   Overweight (BMI 25.0-29.9)  03/29/2019   Right wrist pain 09/30/2015   Unspecified essential hypertension 12/20/2013   Other and unspecified hyperlipidemia 12/20/2013   Type 2 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 disease, without long-term current use of insulin ([place]) 12/20/2013   Neuralgia 12/20/2013   Neck pain 12/20/2013   Dizziness 12/20/2013    Conditions to be addressed/monitored: HTN, HLD, and DMII; Social Isolation  Care Plan : Social Work Plan of Care  Updates made by  Daneen Schick since 06/25/2021 12:00 AM     Problem: Quality of Life (General Plan of Care)      Goal: Advance Directive Education Completed 06/25/2021  Start Date: 05/28/2021  Priority: High  Note:   Current Barriers:  Chronic disease management support and education needs related to HTN, HLD, and DM  Limited education of the importance of an Estate agent Clinical Goal(s):  patient will work with SW to identify and address any acute and/or chronic care coordination needs related to the self health management of HTN, DM, and HLD   Patient will work with SW to gain a better understanding of the importance of completing an Forensic scientist SW Interventions:  Inter-disciplinary care team collaboration (see longitudinal plan of care) Collaboration with Glendale Chard, MD regarding development and update of comprehensive plan of care as evidenced by provider attestation and co-signature Telephonic visit completed with the patient to review Advance Directive process Confirmed patient has received this packet and has completed the packet with exception of having the document notarized Advised the patient that once the document is notarized she is to keep the original while providing a copy to the person she has named her health care POA as well as to provide a copy to her primary providers office - patient stated understanding Goal met Patient Goals/Self-Care Activities patient will:   -  Have Advance Directive notarized      Long-Range Goal: Become more active and engaged in the community   Start Date: 06/25/2021  Priority: High  Note:   Current Barriers:  Chronic disease management support and education needs related to HTN, HLD, and DM  Limited knowledge of programs offered to seniors in the community  Social Worker Clinical Goal(s):  patient will work with SW to identify and address any acute and/or chronic care coordination needs related to the self health  management of HTN, HLD, and DM  patient will work with SW to address concerns related to social isolation  SW Interventions:  Inter-disciplinary care team collaboration (see longitudinal plan of care) Collaboration with Glendale Chard, MD regarding development and update of comprehensive plan of care as evidenced by provider attestation and co-signature Collaboration with RN Care Manager who indicates the patient is interested in becoming more active  Telephonic visit completed with the patient who reports she walks 3 days per week at the park but her daughter has encouraged her to get out of the house more and engage with other seniors in the area Educated the patient on local senior centers including [facility] located at [employer] [facility] [place], [place], and [employer] [facility] Discussed plans for SW to mail the patient the current newsletter for each senior center as well as to refer the patient to each to be added to the mailing list - patient agreeable to plan Advised the patient she may review each calendar and decide which activities she would like to participate in Discussed long term follow up with SW while patient remains actively involved  with  RN Case Manager  to address care management needs Scheduled follow up call over the next 60 days  Patient Goals/Self-Care Activities patient will:   -  Review mailed information on local senior centers -Attend a local senior center if she sees an activity of interest -Contact SW as needed prior to next scheduled call  Follow Up Plan: The care management team will reach out to the patient again over the next 60 days.        Follow Up Plan: SW will follow up with patient by phone over the next 60 days.      Daneen Schick, BSW, CDP Social Worker, Certified Dementia Practitioner [place] [facility] [place] [facility] [phone]

## 2021-06-25 NOTE — Patient Instructions (Signed)
Visit Information  Thank you for taking time to visit with me today. Please don't hesitate to contact me if I can be of assistance to you before our next scheduled telephone appointment.  Following are the goals we discussed today:  (Copy and paste patient goals from clinical care plan here)  Our next appointment is by telephone on 08/21/21 at 1:20 PM   Please call the care guide team at [phone] if you need to cancel or reschedule your appointment.   If you are experiencing a Mental Health or [place] or need someone to talk to, please call [phone] (toll free, 24 hour hotline)   Patient verbalizes understanding of instructions and care plan provided today and agrees to view in [place]. Active MyChart status confirmed with patient.    Barb Merino, RN, BSN, CCM Care Management Coordinator [place] [facility]  Direct Phone: [phone]

## 2021-06-25 NOTE — Patient Instructions (Signed)
Social Worker Visit Information  Goals we discussed today:   Patient Goals/Self-Care Activities patient will:   - Have Advance Directive notarized  - Review mailed information on local senior centers -Attend a local senior center if she sees an activity of interest -Contact SW as needed prior to next scheduled call  Materials Provided: Yes: verbal and written education provided about local senior centers  Patient verbalizes understanding of instructions and care plan provided today and agrees to view in [place]. Active MyChart status confirmed with patient.    Follow Up Plan: SW will follow up with patient by phone over the next 60 days.   Daneen Schick, BSW, CDP Social Worker, Certified Dementia Practitioner [place] [facility] [place] [facility] [phone]

## 2021-06-26 DIAGNOSIS — E119 Type 2 diabetes mellitus without complications: Secondary | ICD-10-CM | POA: Diagnosis not present

## 2021-06-26 DIAGNOSIS — H26491 Other secondary cataract, right eye: Secondary | ICD-10-CM | POA: Diagnosis not present

## 2021-06-26 DIAGNOSIS — Q141 Congenital malformation of retina: Secondary | ICD-10-CM | POA: Diagnosis not present

## 2021-06-26 DIAGNOSIS — H43813 Vitreous degeneration, bilateral: Secondary | ICD-10-CM | POA: Diagnosis not present

## 2021-06-26 DIAGNOSIS — H16223 Keratoconjunctivitis sicca, not specified as Sjogren's, bilateral: Secondary | ICD-10-CM | POA: Diagnosis not present

## 2021-06-26 DIAGNOSIS — H33321 Round hole, right eye: Secondary | ICD-10-CM | POA: Diagnosis not present

## 2021-06-26 DIAGNOSIS — Z961 Presence of intraocular lens: Secondary | ICD-10-CM | POA: Diagnosis not present

## 2021-07-23 ENCOUNTER — Other Ambulatory Visit: Payer: Self-pay

## 2021-07-23 ENCOUNTER — Ambulatory Visit: Payer: PPO | Admitting: Podiatry

## 2021-07-29 DIAGNOSIS — H16223 Keratoconjunctivitis sicca, not specified as Sjogren's, bilateral: Secondary | ICD-10-CM | POA: Diagnosis not present

## 2021-07-29 DIAGNOSIS — H26491 Other secondary cataract, right eye: Secondary | ICD-10-CM | POA: Diagnosis not present

## 2021-07-29 LAB — HM DIABETES EYE EXAM

## 2021-07-30 ENCOUNTER — Encounter: Payer: Self-pay | Admitting: Podiatry

## 2021-07-30 ENCOUNTER — Other Ambulatory Visit: Payer: Self-pay

## 2021-07-30 ENCOUNTER — Ambulatory Visit (INDEPENDENT_AMBULATORY_CARE_PROVIDER_SITE_OTHER): Payer: PPO | Admitting: Podiatry

## 2021-07-30 DIAGNOSIS — K5900 Constipation, unspecified: Secondary | ICD-10-CM | POA: Insufficient documentation

## 2021-07-30 DIAGNOSIS — B351 Tinea unguium: Secondary | ICD-10-CM

## 2021-07-30 DIAGNOSIS — R141 Gas pain: Secondary | ICD-10-CM | POA: Insufficient documentation

## 2021-07-30 DIAGNOSIS — E1122 Type 2 diabetes mellitus with diabetic chronic kidney disease: Secondary | ICD-10-CM

## 2021-07-30 DIAGNOSIS — N182 Chronic kidney disease, stage 2 (mild): Secondary | ICD-10-CM | POA: Diagnosis not present

## 2021-07-30 DIAGNOSIS — Z1211 Encounter for screening for malignant neoplasm of colon: Secondary | ICD-10-CM | POA: Insufficient documentation

## 2021-07-30 DIAGNOSIS — K219 Gastro-esophageal reflux disease without esophagitis: Secondary | ICD-10-CM | POA: Insufficient documentation

## 2021-07-30 DIAGNOSIS — R142 Eructation: Secondary | ICD-10-CM | POA: Insufficient documentation

## 2021-07-30 DIAGNOSIS — R1031 Right lower quadrant pain: Secondary | ICD-10-CM | POA: Insufficient documentation

## 2021-07-30 DIAGNOSIS — Z86010 Personal history of colonic polyps: Secondary | ICD-10-CM | POA: Insufficient documentation

## 2021-08-03 IMAGING — MG MM DIGITAL SCREENING BILAT W/ TOMO W/ CAD
8 series · 8 of 24 positions shown · non-contrast
Comparison: Previous exam(s).

CLINICAL DATA: Screening.

EXAM:
DIGITAL SCREENING BILATERAL MAMMOGRAM WITH TOMO AND CAD

[R CC synth-2D]
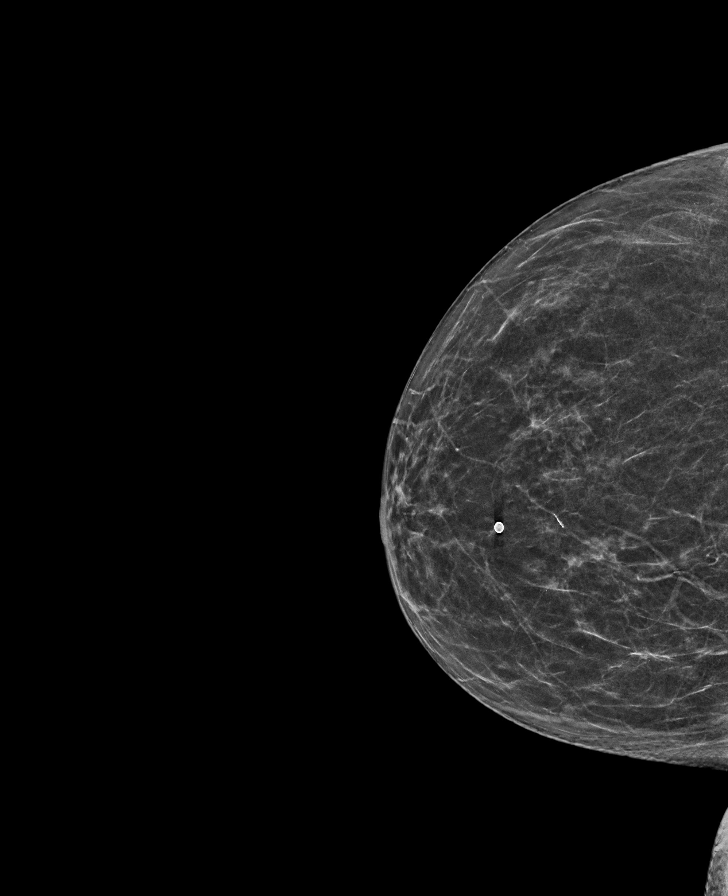

[R MLO synth-2D]
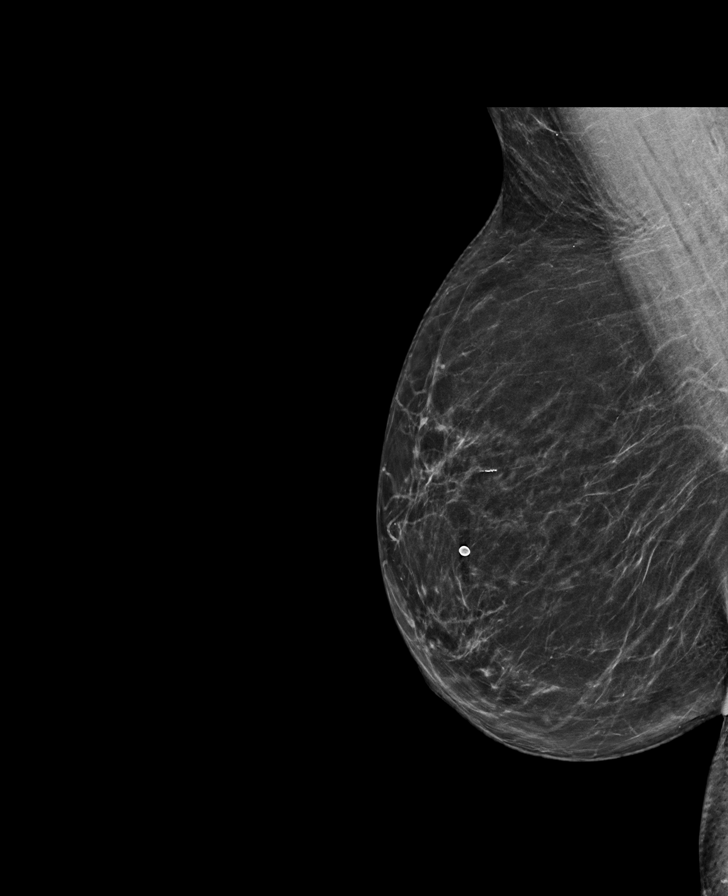

[L CC synth-2D]
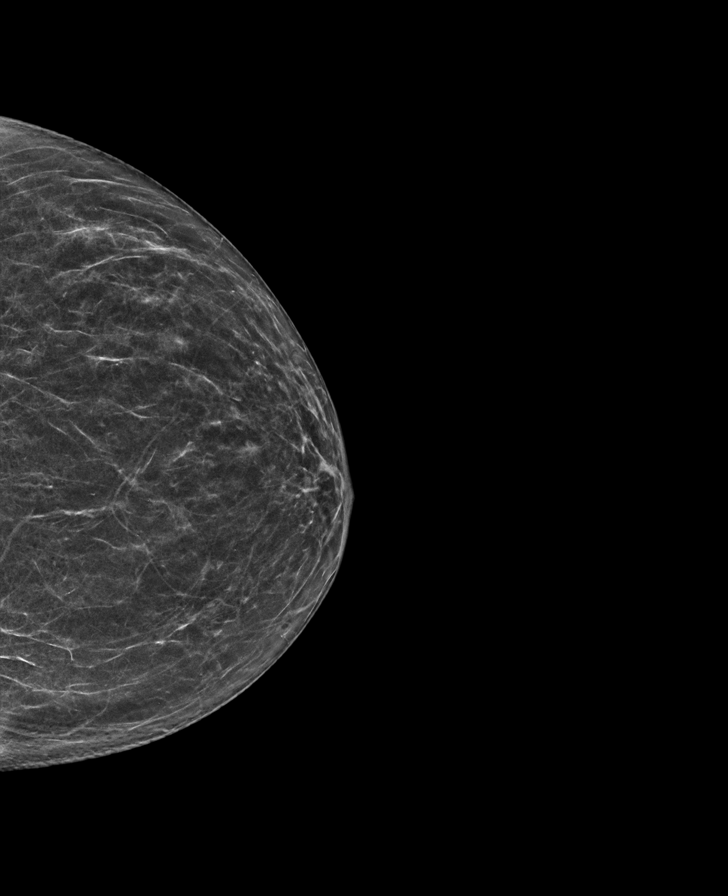

[L MLO synth-2D]
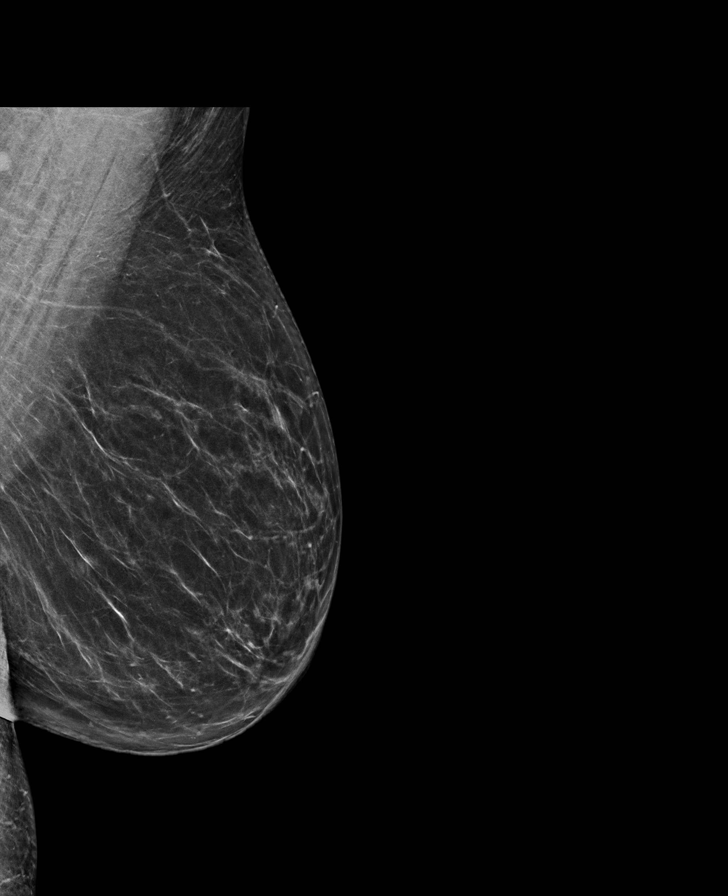

[R MLO tomo · tomo slice 35/70.0]
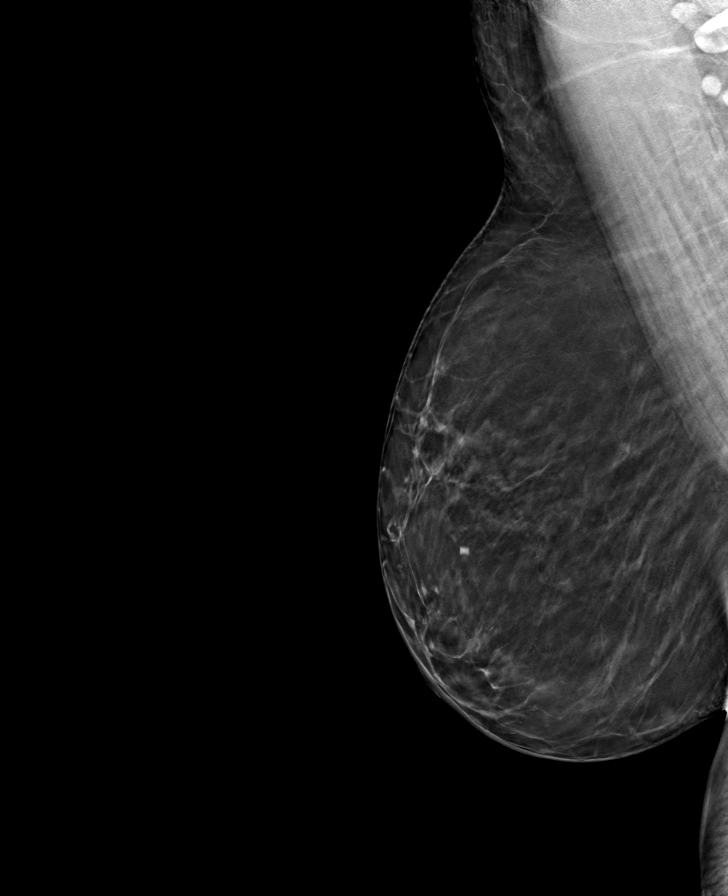

[L CC tomo · tomo slice 29/57.0]
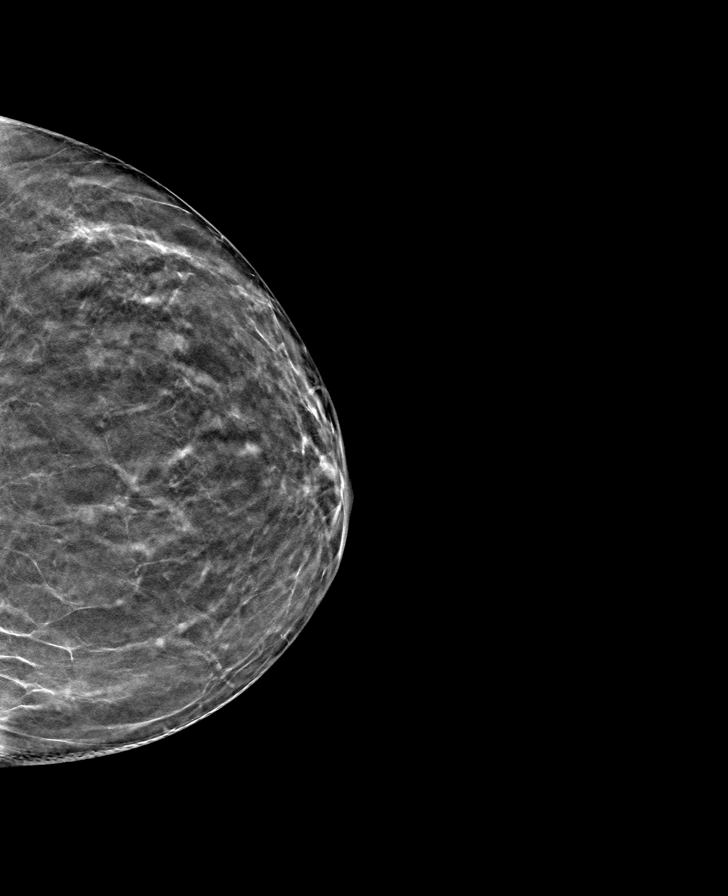

[R CC tomo · tomo slice 29/57.0]
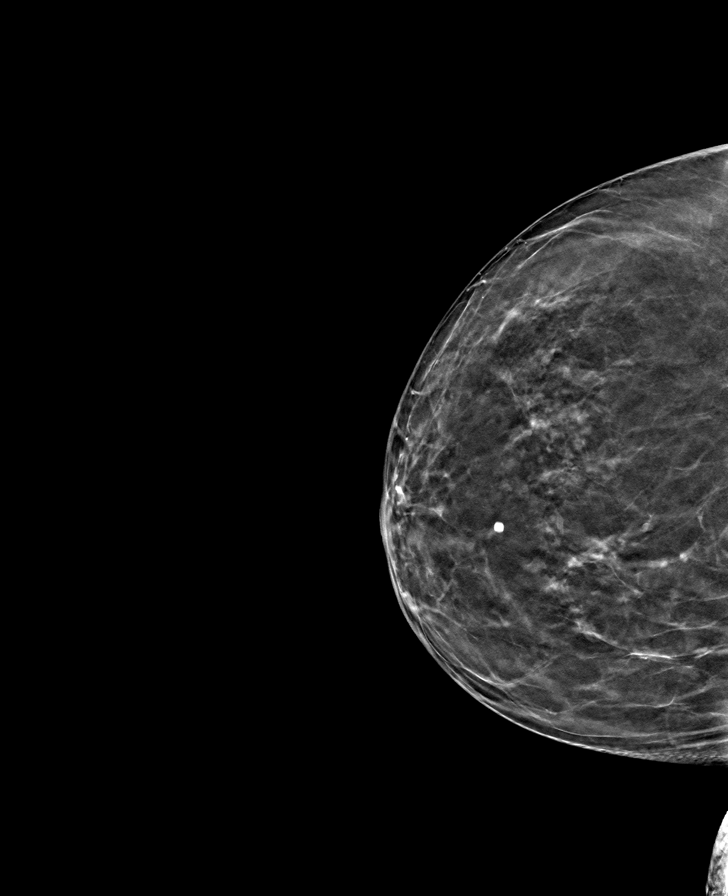

[L MLO tomo · tomo slice 36/71.0]
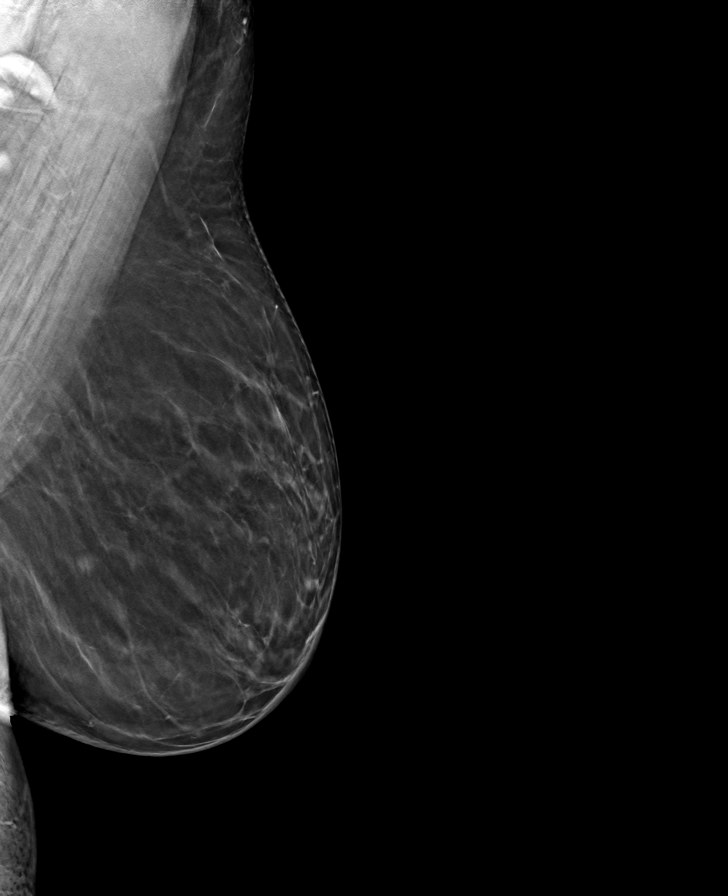

[8 of 24 positions shown; findings below may reference images not displayed]

ACR Breast Density Category b: There are scattered areas of
fibroglandular density.
FINDINGS: There are no findings suspicious for malignancy. Images were
processed with CAD.
IMPRESSION: No mammographic evidence of malignancy. A result letter of this
screening mammogram will be mailed directly to the patient.

RECOMMENDATION:
Screening mammogram in one year. (Code:[id number])

BI-RADS CATEGORY  1: Negative.

## 2021-08-05 NOTE — Progress Notes (Signed)
?  Subjective:  ?Patient ID: Brooke Brown, female    DOB: May 12, 1951,  MRN: [id number] ? ?[facility] Brooke Brown presents to clinic today for painful thick toenails that are difficult to trim. Pain interferes with ambulation. Aggravating factors include wearing enclosed shoe gear. Pain is relieved with periodic professional debridement. ? ?Patient states blood glucose was 113 mg/dl on Monday.  Patient did not check blood glucose today. ? ?New problem(s): None.  ? ?PCP is Glendale Chard, MD , and last visit was May 05, 2021. ? ?No Known Allergies ? ?Review of Systems: Negative except as noted in the HPI. ?Objective:  ?Vascular Examination: ?Vascular status intact b/l with palpable pedal pulses. Pedal hair present b/l. CFT immediate b/l. No edema. No pain with calf compression b/l. Skin temperature gradient WNL b/l.  ? ?Neurological Examination: ?Sensation grossly intact b/l with 10 gram monofilament. Vibratory sensation intact b/l.  ? ?Dermatological Examination: ?Pedal skin with normal turgor, texture and tone b/l. No hyperkeratotic lesions noted b/l. Toenails bilateral great toes and bilateral 5th toes elongated, discolored, dystrophic, thickened, and crumbly with subungual debris and tenderness to dorsal palpation. ? ?Musculoskeletal Examination: ?Muscle strength 5/5 to b/l LE. No pain, crepitus or joint limitation noted with ROM bilateral LE. No gross bony deformities bilaterally. ? ?Last A1c:   ?Hemoglobin A1C Latest Ref Rng & Units 05/05/2021 12/31/2020 08/15/2020  ?HGBA1C 4.8 - 5.6 % 6.4(H) 6.5(H) 6.5(H)  ?Some recent data might be hidden  ?  ?Assessment/Plan: ?1. Onychomycosis   ?2. Type 2 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 disease, without long-term current use of insulin ([place])   ?-Mycotic toenails bilateral great toes and bilateral 5th toes were debrided in length and girth with sterile nail nippers and dremel without iatrogenic bleeding. ?-Patient/POA to call should there be  question/concern in the interim.  ? ?Return in about 3 months (around 10/30/2021). ? ?Marzetta Board, DPM  ?

## 2021-08-09 ENCOUNTER — Other Ambulatory Visit: Payer: Self-pay | Admitting: Internal Medicine

## 2021-08-11 ENCOUNTER — Other Ambulatory Visit: Payer: Self-pay

## 2021-08-11 ENCOUNTER — Encounter: Payer: Self-pay | Admitting: Internal Medicine

## 2021-08-11 ENCOUNTER — Ambulatory Visit (INDEPENDENT_AMBULATORY_CARE_PROVIDER_SITE_OTHER): Payer: PPO | Admitting: Internal Medicine

## 2021-08-11 VITALS — BP 128/76 | HR 71 | Temp 98.1°F | Ht 65.0 in | Wt 153.6 lb

## 2021-08-11 DIAGNOSIS — Z2821 Immunization not carried out because of patient refusal: Secondary | ICD-10-CM

## 2021-08-11 DIAGNOSIS — I129 Hypertensive chronic kidney disease with stage 1 through stage 4 chronic kidney disease, or unspecified chronic kidney disease: Secondary | ICD-10-CM | POA: Diagnosis not present

## 2021-08-11 DIAGNOSIS — Z6825 Body mass index (BMI) 25.0-25.9, adult: Secondary | ICD-10-CM | POA: Diagnosis not present

## 2021-08-11 DIAGNOSIS — I700 Atherosclerosis of aorta: Secondary | ICD-10-CM | POA: Diagnosis not present

## 2021-08-11 DIAGNOSIS — E1122 Type 2 diabetes mellitus with diabetic chronic kidney disease: Secondary | ICD-10-CM

## 2021-08-11 DIAGNOSIS — N182 Chronic kidney disease, stage 2 (mild): Secondary | ICD-10-CM

## 2021-08-11 NOTE — Progress Notes (Signed)
?Rich Brave [place],acting as a Education administrator for [name], MD.,have documented all relevant documentation on the behalf of [name], MD,as directed by  [name], MD while in the presence of [name], MD.  ?This visit occurred during the SARS-CoV-2 public health emergency.  Safety protocols were in place, including screening questions prior to the visit, additional usage of staff PPE, and extensive cleaning of exam room while observing appropriate contact time as indicated for disinfecting solutions. ? ?Subjective:  ?  ? Patient ID: Brooke Brown , female    DOB: 06-May-1951 , 71 y.o.   MRN: [id number] ? ? ?Chief Complaint  ?Patient presents with  ? Diabetes  ? Hypertension  ? ? ?HPI ? ?She presents today for diabetes, blood pressure check. She reports compliance with meds.  She denies headaches, chest pain and shortness of breath. She is concerned about her home BP readings, most are above 140/90. She denies change in her eating habits/exercise regimen.  ? ?Diabetes ?She presents for her follow-up diabetic visit. She has type 2 diabetes mellitus. Her disease course has been stable. There are no hypoglycemic associated symptoms. Pertinent negatives for diabetes include no blurred vision, no polydipsia, no polyphagia and no polyuria. There are no hypoglycemic complications. Diabetic complications include nephropathy. Risk factors for coronary artery disease include diabetes mellitus, dyslipidemia, hypertension, post-menopausal and sedentary lifestyle. Her weight is stable. She is following a diabetic diet. She participates in exercise three times a week. Her breakfast blood glucose is taken between 8-9 am. Her breakfast blood glucose range is generally 110-130 mg/dl. Eye exam is current.  ?Hypertension ?This is a chronic problem. The current episode started more than 1 year ago. The problem has been gradually improving since onset. The problem is controlled. Pertinent negatives  include no blurred vision, neck pain or palpitations.   ? ?Past Medical History:  ?Diagnosis Date  ? Anxiety   ? Arthritis   ? Diabetes mellitus   ? Grave's disease   ? Grave's disease   ? Hyperlipemia   ? Hypertension   ?  ? ?Family History  ?Problem Relation Age of Onset  ? Pancreatic cancer Mother   ? Heart failure Father   ? Diabetes Brother   ? Breast cancer Cousin   ? ? ? ?Current Outpatient Medications:  ?  acetaminophen (TYLENOL) 500 MG tablet, Take 500 mg by mouth daily as needed for moderate pain or headache., Disp: , Rfl:  ?  amLODipine (NORVASC) 5 MG tablet, Take 1 tablet by mouth once daily, Disp: 90 tablet, Rfl: 1 ?  aspirin EC 81 MG tablet, Take 81 mg by mouth daily., Disp: , Rfl:  ?  atorvastatin (LIPITOR) 20 MG tablet, Take 1 tablet (20 mg total) by mouth 3 (three) times a week for 36 doses., Disp: 36 tablet, Rfl: 0 ?  Blood Glucose Monitoring Suppl (ONE TOUCH ULTRA 2) w/Device KIT, Use to check blood sugar 3 times a day. Dx code e11.65, Disp: 1 kit, Rfl: 2 ?  calcium carbonate (OSCAL) 1500 (600 Ca) MG TABS tablet, Take 600 mg of elemental calcium by mouth daily., Disp: , Rfl:  ?  Carboxymethylcellul-Glycerin (LUBRICATING EYE DROPS OP), Place 1 drop into both eyes 2 (two) times daily., Disp: , Rfl:  ?  hydrochlorothiazide (HYDRODIURIL) 25 MG tablet, Take 1 tablet by mouth once daily, Disp: 90 tablet, Rfl: 2 ?  Magnesium 250 MG TABS, otc, Disp:  , Rfl: 0 ?  metFORMIN (GLUCOPHAGE-XR) 500 MG 24 hr  tablet, Take 1 tablet by mouth twice daily, Disp: 180 tablet, Rfl: 1 ?  Multiple Vitamin (MULTIVITAMIN WITH MINERALS) TABS tablet, Take 1 tablet by mouth daily., Disp: , Rfl:  ?  olmesartan (BENICAR) 40 MG tablet, Take 1 tablet by mouth once daily, Disp: 90 tablet, Rfl: 1 ?  OneTouch Delica Lancets 11A MISC, USE AS DIRECTED TO CHECK BLOOD SUGARS ONCE DAILY, Disp: 100 each, Rfl: 2 ?  ONETOUCH ULTRA test strip, USE AS INSTRUCTED TO CHECK BLOOD SUGARS ONE TIME PER DAY, Disp: 100 each, Rfl: 2 ?  SYNTHROID 112 MCG  tablet, TAKE 1 TABLET BY MOUTH ONCE DAILY MONDAY-SATURDAY  AND  ONE  HALF  TABLET  ON  SUNDAY, Disp: 90 tablet, Rfl: 1  ? ?No Known Allergies  ? ?Review of Systems  ?Constitutional: Negative.   ?Eyes:  Negative for blurred vision.  ?Respiratory: Negative.    ?Cardiovascular: Negative.  Negative for palpitations.  ?Endocrine: Negative for polydipsia, polyphagia and polyuria.  ?Musculoskeletal:  Negative for neck pain.  ?Neurological: Negative.   ?Psychiatric/Behavioral: Negative.     ? ?Today's Vitals  ? 08/11/21 1610 08/11/21 1656 08/11/21 1657  ?BP: 118/70 134/80 128/76  ?Pulse: 71    ?Temp: 98.1 ?F (36.7 ?C)    ?Weight: 153 lb 9.6 oz (69.7 kg)    ?Height: $RemoveB'5\' 5"'iuTIMZDu$  (1.651 m)    ?PainSc: 0-No pain    ? ?Body mass index is 25.56 kg/m?.  ?Wt Readings from Last 3 Encounters:  ?08/11/21 153 lb 9.6 oz (69.7 kg)  ?06/12/21 152 lb (68.9 kg)  ?05/05/21 154 lb 12.8 oz (70.2 kg)  ?  ? ?Objective:  ?Physical Exam ?Vitals and nursing note reviewed.  ?Constitutional:   ?   Appearance: Normal appearance.  ?HENT:  ?   Head: Normocephalic and atraumatic.  ?   Nose:  ?   Comments: Masked  ?   Mouth/Throat:  ?   Comments: Masked  ?Eyes:  ?   Extraocular Movements: Extraocular movements intact.  ?Cardiovascular:  ?   Rate and Rhythm: Normal rate and regular rhythm.  ?   Heart sounds: Normal heart sounds.  ?Pulmonary:  ?   Effort: Pulmonary effort is normal.  ?   Breath sounds: Normal breath sounds.  ?Musculoskeletal:  ?   Cervical back: Normal range of motion.  ?Skin: ?   General: Skin is warm.  ?Neurological:  ?   General: No focal deficit present.  ?   Mental Status: She is alert.  ?Psychiatric:     ?   Mood and Affect: Mood normal.     ?   Behavior: Behavior normal.  ?   ?Assessment And Plan:  ?   ?1. Type 2 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 disease, without long-term current use of insulin ([place]) ?Comments: Chronic, I will check labs as below. She was commended on resuming her regular exercise regimen. She will rto in 3  months for re-evaluation.  ?- CMP14+EGFR ?- Hemoglobin A1c ? ?2. Benign hypertensive renal disease ?Comments: Chronic, well controlled. However, reading is 134/80 with her machine. She will c/w current meds, but take amlodipine at dinner. We also discussed dietary changes including cutting back on processed meats - bacon/sausages and packaged foods which tend to be high in sodium. She agrees to rto in 2 weeks for a nurse visit.  ? ?3. Atherosclerosis of aorta ([place]) ?Comments: Chronic, she is on ASA and statin therapy. She will c/w with atorvastatin therapy.  ? ?4. Herpes zoster vaccination declined ? ?  5. BMI 25.0-25.9,adult ?Comments: She is encouraged to aim for at least 150 minutes of exercise per week. She is 5lbs less than she was March 2022.  ?  ?Patient was given opportunity to ask questions. Patient verbalized understanding of the plan and was able to repeat key elements of the plan. All questions were answered to their satisfaction.  ? ?I, [name], MD, have reviewed all documentation for this visit. The documentation on 08/11/21 for the exam, diagnosis, procedures, and orders are all accurate and complete.  ? ?IF YOU HAVE BEEN REFERRED TO A SPECIALIST, IT MAY TAKE 1-2 WEEKS TO SCHEDULE/PROCESS THE REFERRAL. IF YOU HAVE NOT HEARD FROM US/SPECIALIST IN TWO WEEKS, PLEASE GIVE [place] A CALL AT [phone] X [phone].  ? ?THE PATIENT IS ENCOURAGED TO PRACTICE SOCIAL DISTANCING DUE TO THE COVID-19 PANDEMIC.   ?

## 2021-08-11 NOTE — Patient Instructions (Signed)

## 2021-08-12 LAB — HEMOGLOBIN A1C
Est. average glucose Bld gHb Est-mCnc: 137 mg/dL
Hgb A1c MFr Bld: 6.4 % — ABNORMAL HIGH (ref 4.8–5.6)

## 2021-08-12 LAB — CMP14+EGFR
ALT: 13 IU/L (ref 0–32)
AST: 26 IU/L (ref 0–40)
Albumin/Globulin Ratio: 1.2 (ref 1.2–2.2)
Albumin: 4.3 g/dL (ref 3.7–4.7)
Alkaline Phosphatase: 56 IU/L (ref 44–121)
BUN/Creatinine Ratio: 15 (ref 12–28)
BUN: 12 mg/dL (ref 8–27)
Bilirubin Total: <0.2 mg/dL (ref 0.0–1.2)
CO2: 26 mmol/L (ref 20–29)
Calcium: 10.7 mg/dL — ABNORMAL HIGH (ref 8.7–10.3)
Chloride: 103 mmol/L (ref 96–106)
Creatinine, Ser: 0.8 mg/dL (ref 0.57–1.00)
Globulin, Total: 3.5 g/dL (ref 1.5–4.5)
Glucose: 99 mg/dL (ref 70–99)
Potassium: 4.2 mmol/L (ref 3.5–5.2)
Sodium: 143 mmol/L (ref 134–144)
Total Protein: 7.8 g/dL (ref 6.0–8.5)
eGFR: 78 mL/min/{1.73_m2} (ref >59)

## 2021-08-15 ENCOUNTER — Encounter: Payer: Self-pay | Admitting: Internal Medicine

## 2021-08-21 ENCOUNTER — Ambulatory Visit (INDEPENDENT_AMBULATORY_CARE_PROVIDER_SITE_OTHER): Payer: PPO

## 2021-08-21 ENCOUNTER — Telehealth: Payer: PPO

## 2021-08-21 DIAGNOSIS — I129 Hypertensive chronic kidney disease with stage 1 through stage 4 chronic kidney disease, or unspecified chronic kidney disease: Secondary | ICD-10-CM

## 2021-08-21 DIAGNOSIS — E1122 Type 2 diabetes mellitus with diabetic chronic kidney disease: Secondary | ICD-10-CM

## 2021-08-21 DIAGNOSIS — E890 Postprocedural hypothyroidism: Secondary | ICD-10-CM

## 2021-08-21 NOTE — Patient Instructions (Signed)
Visit Information ? ?Thank you for taking time to visit with me today. Please don't hesitate to contact me if I can be of assistance to you before our next scheduled telephone appointment. ? ?Following are the goals we discussed today:  ?(Copy and paste patient goals from clinical care plan here) ? ?Our next appointment is by telephone on 11/21/21 at 12:45 PM ? ?Please call the care guide team at [phone] if you need to cancel or reschedule your appointment.  ? ?If you are experiencing a Mental Health or [place] or need someone to talk to, please call [phone]-TALK (toll free, 24 hour hotline)  ? ?Patient verbalizes understanding of instructions and care plan provided today and agrees to view in [place]. Active MyChart status confirmed with patient.   ? ?Barb Merino, RN, BSN, CCM ?Care Management Coordinator ?[place] [facility]  ?Direct Phone: [phone] ? ? ?

## 2021-08-21 NOTE — Chronic Care Management (AMB) (Signed)
?Chronic Care Management  ? ?CCM RN Visit Note ? ?08/21/2021 ?Name: Brooke Brown MRN: [id number] DOB: 02/12/1951 ? ?Subjective: ?Brooke Brown is a 71 y.o. year old female who is a primary care patient of Glendale Chard, MD. The care management team was consulted for assistance with disease management and care coordination needs.   ? ?Engaged with patient by telephone for follow up visit in response to provider referral for case management and/or care coordination services.  ? ?Consent to Services:  ?The patient was given information about Chronic Care Management services, agreed to services, and gave verbal consent prior to initiation of services.  Please see initial visit note for detailed documentation.  ? ?Patient agreed to services and verbal consent obtained.  ? ?Assessment: Review of patient past medical history, allergies, medications, health status, including review of consultants reports, laboratory and other test data, was performed as part of comprehensive evaluation and provision of chronic care management services.  ? ?SDOH (Social Determinants of Health) assessments and interventions performed:  Yes, no acute challenges  ? ?CCM Care Plan ? ?No Known Allergies ? ?Outpatient Encounter Medications as of 08/21/2021  ?Medication Sig  ? acetaminophen (TYLENOL) 500 MG tablet Take 500 mg by mouth daily as needed for moderate pain or headache.  ? amLODipine (NORVASC) 5 MG tablet Take 1 tablet by mouth once daily  ? aspirin EC 81 MG tablet Take 81 mg by mouth daily.  ? atorvastatin (LIPITOR) 20 MG tablet TAKE 1 TABLET BY MOUTH 3 TIMES A WEEK  ? Blood Glucose Monitoring Suppl (ONE TOUCH ULTRA 2) w/Device KIT Use to check blood sugar 3 times a day. Dx code e11.65  ? calcium carbonate (OSCAL) 1500 (600 Ca) MG TABS tablet Take 600 mg of elemental calcium by mouth daily.  ? Carboxymethylcellul-Glycerin (LUBRICATING EYE DROPS OP) Place 1 drop into both eyes 2 (two) times daily.  ?  hydrochlorothiazide (HYDRODIURIL) 25 MG tablet Take 1 tablet by mouth once daily  ? Magnesium 250 MG TABS otc  ? metFORMIN (GLUCOPHAGE-XR) 500 MG 24 hr tablet Take 1 tablet by mouth twice daily  ? Multiple Vitamin (MULTIVITAMIN WITH MINERALS) TABS tablet Take 1 tablet by mouth daily.  ? olmesartan (BENICAR) 40 MG tablet Take 1 tablet by mouth once daily  ? OneTouch Delica Lancets 50T MISC USE AS DIRECTED TO CHECK BLOOD SUGARS ONCE DAILY  ? ONETOUCH ULTRA test strip USE AS INSTRUCTED TO CHECK BLOOD SUGARS ONE TIME PER DAY  ? SYNTHROID 112 MCG tablet TAKE 1 TABLET BY MOUTH ONCE DAILY MONDAY-SATURDAY  AND  ONE  HALF  TABLET  ON  SUNDAY  ? ?No facility-administered encounter medications on file as of 08/21/2021.  ? ? ?Patient Active Problem List  ? Diagnosis Date Noted  ? Colon cancer screening 07/30/2021  ? Flatulence, eructation and gas pain 07/30/2021  ? Gastroesophageal reflux disease 07/30/2021  ? History of colonic polyps 07/30/2021  ? Right lower quadrant pain 07/30/2021  ? Constipation 07/30/2021  ? Osteoarthritis of carpometacarpal [facility]) joint of thumb 03/04/2021  ? Bilateral hand pain 03/04/2021  ? Atherosclerosis of aorta ([place]) 08/15/2020  ? Anosmia 02/27/2020  ? 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 Hypothyroidism 02/27/2020  ? Other long term (current) drug therapy 02/27/2020  ? Overweight with body mass index (BMI) of 27 to 27.9 in adult 12/26/2019  ? Vitamin D deficiency disease 12/26/2019  ? Pure hypercholesterolemia 03/29/2019  ? 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  ? Overweight (BMI 25.0-29.9) 03/29/2019  ?  Right wrist pain 09/30/2015  ? Unspecified essential hypertension 12/20/2013  ? Other and unspecified hyperlipidemia 12/20/2013  ? Type 2 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 disease, without long-term current use of insulin ([place]) 12/20/2013  ? Neuralgia 12/20/2013  ? Neck pain 12/20/2013  ? Dizziness 12/20/2013  ? ? ?Conditions to be  addressed/monitored: Type 2 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 disease, without long-term current use of insulin, Benign hypertensive renal disease,Postablative hypothyroidism ? ?Care Plan : RN Care Manager Plan of Care  ?Updates made by Lynne Logan, RN since 08/21/2021 12:00 AM  ?  ? ?Problem: No plan established with management of chronic disease states (Type 2 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 disease, without long-term current use of insulin, Benign hypertensive renal disease,Postablative hypothyroidism)   ?Priority: High  ?  ? ?Long-Range Goal: Establishment of plan of care for management of chronic disease states (Type 2 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 disease, without long-term current use of insulin, Benign hypertensive renal disease,Postablative hypothyroidism)   ?Start Date: 05/28/2021  ?Expected End Date: 05/28/2022  ?Recent Progress: On track  ?Priority: High  ?Note:   ?Current Barriers:  ?Knowledge Deficits related to plan of care for management of Type 2 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 disease, without long-term current use of insulin, Benign hypertensive renal disease,Postablative hypothyroidism  ?Chronic Disease Management support and education needs related to Type 2 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 disease, without long-term current use of insulin, Benign hypertensive renal disease,Postablative hypothyroidism  ? ?RNCM Clinical Goal(s):  ?Patient will verbalize basic understanding of  Type 2 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 disease, without long-term current use of insulin, Benign hypertensive renal disease,Postablative hypothyroidism disease process and self health management plan as evidenced by patient will report having no disease exacerbations related to her chronic disease states as listed above ?take all medications exactly as prescribed and will call provider for medication related questions as evidenced by  demonstrate improved understanding of prescribed medications and rationale for usage as evidenced by patient teach back ?demonstrate Ongoing health management independence as evidenced by patient will report 100% adherence to her prescribed treatment plan  ?continue to work with RN Care Manager to address care management and care coordination needs related to  Type 2 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 disease, without long-term current use of insulin, Benign hypertensive renal disease,Postablative hypothyroidism as evidenced by adherence to CM Team Scheduled appointments ?demonstrate ongoing self health care management ability   as evidenced by    through collaboration with RN Care manager, provider, and care team.  ? ?Interventions: ?1:1 collaboration with primary care provider regarding development and update of comprehensive plan of care as evidenced by provider attestation and co-signature ?Inter-disciplinary care team collaboration (see longitudinal plan of care) ?Evaluation of current treatment plan related to  self management and patient's adherence to plan as established by provider ? ?Interdisciplinary Collaboration Interventions:  (Status: Goal on track:  Yes.) Long Term Goal   ?Collaborated with BSW to initiate plan of care to address needs related to Limited education about Advanced Directives* in patient with  Type 2 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 disease, without long-term current use of insulin, Benign hypertensive renal disease,Postablative hypothyroidism ?Collaboration with Glendale Chard, MD regarding development and update of comprehensive plan of care as evidenced by provider attestation and co-signature ?Inter-disciplinary care team collaboration  ? ?Diabetes Interventions:  (Status:  Goal on track:  Yes.)  Long Term Goal ?Assessed patient's understanding of A1c goal: <6.5% ?Provided education to patient about basic DM disease process ?Reviewed medications with patient  and discussed importance of medication adherence ?Counseled on importance of regular laboratory monitoring as prescribed ?Review of patient status, including review of consultants reports, relevant laboratory and other test results, and medica

## 2021-08-22 DIAGNOSIS — E890 Postprocedural hypothyroidism: Secondary | ICD-10-CM | POA: Diagnosis not present

## 2021-08-22 DIAGNOSIS — I129 Hypertensive chronic kidney disease with stage 1 through stage 4 chronic kidney disease, or unspecified chronic kidney disease: Secondary | ICD-10-CM

## 2021-08-22 DIAGNOSIS — E1122 Type 2 diabetes mellitus with diabetic chronic kidney disease: Secondary | ICD-10-CM | POA: Diagnosis not present

## 2021-08-22 DIAGNOSIS — N182 Chronic kidney disease, stage 2 (mild): Secondary | ICD-10-CM | POA: Diagnosis not present

## 2021-08-25 ENCOUNTER — Ambulatory Visit (INDEPENDENT_AMBULATORY_CARE_PROVIDER_SITE_OTHER): Payer: PPO

## 2021-08-25 DIAGNOSIS — E782 Mixed hyperlipidemia: Secondary | ICD-10-CM

## 2021-08-25 DIAGNOSIS — E1122 Type 2 diabetes mellitus with diabetic chronic kidney disease: Secondary | ICD-10-CM

## 2021-08-25 DIAGNOSIS — I129 Hypertensive chronic kidney disease with stage 1 through stage 4 chronic kidney disease, or unspecified chronic kidney disease: Secondary | ICD-10-CM

## 2021-08-25 NOTE — Chronic Care Management (AMB) (Signed)
?Chronic Care Management  ? ? Social Work Note ? ?08/25/2021 ?Name: Jacki Couse MRN: [id number] DOB: 09/04/1950 ? ?Carime Dinkel Croker is a 71 y.o. year old female who is a primary care patient of Glendale Chard, MD. The CCM team was consulted to assist the patient with chronic disease management and/or care coordination needs related to:  DM II, HLD, HTN .  ? ?Engaged with patient by telephone for follow up visit in response to provider referral for social work chronic care management and care coordination services.  ? ?Consent to Services:  ?The patient was given information about Chronic Care Management services, agreed to services, and gave verbal consent prior to initiation of services.  Please see initial visit note for detailed documentation.  ? ?Patient agreed to services and consent obtained.  ? ?Assessment: Review of patient past medical history, allergies, medications, and health status, including review of relevant consultants reports was performed today as part of a comprehensive evaluation and provision of chronic care management and care coordination services.    ? ?SDOH (Social Determinants of Health) assessments and interventions performed:  ?SDOH Interventions   ? ?Flowsheet Row Most Recent Value  ?SDOH Interventions   ?Food Insecurity Interventions Intervention Not Indicated  ?Housing Interventions Intervention Not Indicated  ?Social Connections Interventions Intervention Not Indicated  ?Transportation Interventions Intervention Not Indicated  ? ?  ?  ? ?Advanced Directives Status: Not addressed in this encounter. ? ?CCM Care Plan ? ?No Known Allergies ? ?Outpatient Encounter Medications as of 08/25/2021  ?Medication Sig  ? acetaminophen (TYLENOL) 500 MG tablet Take 500 mg by mouth daily as needed for moderate pain or headache.  ? amLODipine (NORVASC) 5 MG tablet Take 1 tablet by mouth once daily  ? aspirin EC 81 MG tablet Take 81 mg by mouth daily.  ? atorvastatin (LIPITOR) 20 MG  tablet TAKE 1 TABLET BY MOUTH 3 TIMES A WEEK  ? Blood Glucose Monitoring Suppl (ONE TOUCH ULTRA 2) w/Device KIT Use to check blood sugar 3 times a day. Dx code e11.65  ? calcium carbonate (OSCAL) 1500 (600 Ca) MG TABS tablet Take 600 mg of elemental calcium by mouth daily.  ? Carboxymethylcellul-Glycerin (LUBRICATING EYE DROPS OP) Place 1 drop into both eyes 2 (two) times daily.  ? hydrochlorothiazide (HYDRODIURIL) 25 MG tablet Take 1 tablet by mouth once daily  ? Magnesium 250 MG TABS otc  ? metFORMIN (GLUCOPHAGE-XR) 500 MG 24 hr tablet Take 1 tablet by mouth twice daily  ? Multiple Vitamin (MULTIVITAMIN WITH MINERALS) TABS tablet Take 1 tablet by mouth daily.  ? olmesartan (BENICAR) 40 MG tablet Take 1 tablet by mouth once daily  ? OneTouch Delica Lancets 63Z MISC USE AS DIRECTED TO CHECK BLOOD SUGARS ONCE DAILY  ? ONETOUCH ULTRA test strip USE AS INSTRUCTED TO CHECK BLOOD SUGARS ONE TIME PER DAY  ? SYNTHROID 112 MCG tablet TAKE 1 TABLET BY MOUTH ONCE DAILY MONDAY-SATURDAY  AND  ONE  HALF  TABLET  ON  SUNDAY  ? ?No facility-administered encounter medications on file as of 08/25/2021.  ? ? ?Patient Active Problem List  ? Diagnosis Date Noted  ? Colon cancer screening 07/30/2021  ? Flatulence, eructation and gas pain 07/30/2021  ? Gastroesophageal reflux disease 07/30/2021  ? History of colonic polyps 07/30/2021  ? Right lower quadrant pain 07/30/2021  ? Constipation 07/30/2021  ? Osteoarthritis of carpometacarpal [facility]) joint of thumb 03/04/2021  ? Bilateral hand pain 03/04/2021  ? Atherosclerosis of aorta ([place]) 08/15/2020  ? Anosmia  02/27/2020  ? 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 Hypothyroidism 02/27/2020  ? Other long term (current) drug therapy 02/27/2020  ? Overweight with body mass index (BMI) of 27 to 27.9 in adult 12/26/2019  ? Vitamin D deficiency disease 12/26/2019  ? Pure hypercholesterolemia 03/29/2019  ? 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  ? Overweight (BMI  25.0-29.9) 03/29/2019  ? Right wrist pain 09/30/2015  ? Unspecified essential hypertension 12/20/2013  ? Other and unspecified hyperlipidemia 12/20/2013  ? Type 2 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 disease, without long-term current use of insulin ([place]) 12/20/2013  ? Neuralgia 12/20/2013  ? Neck pain 12/20/2013  ? Dizziness 12/20/2013  ? ? ?Conditions to be addressed/monitored: HTN, HLD, and DMII ? ?Care Plan : Social Work Plan of Care  ?Updates made by Daneen Schick since 08/25/2021 12:00 AM  ?Completed 08/25/2021  ? ?Problem: Quality of Life (General Plan of Care) Resolved 08/25/2021  ?  ? ?Long-Range Goal: Become more active and engaged in the community Completed 08/25/2021  ?Start Date: 06/25/2021  ?Priority: High  ?Note:   ?Current Barriers:  ?Chronic disease management support and education needs related to HTN, HLD, and DM  ?Limited knowledge of programs offered to seniors in the community ? ?Social Worker Clinical Goal(s):  ?patient will work with SW to identify and address any acute and/or chronic care coordination needs related to the self health management of HTN, HLD, and DM  ?patient will work with SW to address concerns related to social isolation ? ?SW Interventions:  ?Inter-disciplinary care team collaboration (see longitudinal plan of care) ?Collaboration with Glendale Chard, MD regarding development and update of comprehensive plan of care as evidenced by provider attestation and co-signature ?Collaboration with RN Care Manager who indicates the patient is interested in becoming more active  ?Telephonic visit completed with the patient  to assess goal progression ?Confirmed receipt of mailed resource - discussed the patient would begin receiving mailings from local senior centers with quarterly newsletters ?Discussed the patient and her friend may begin taking computer classes together ?SDoH screening performed - no acute resource needs identified ?Performed chart review to note the patient has a  nurse visit for a blood pressure check scheduled on 08/26/21. Patient is knowledgeable of this appointment and denies transportation barriers ?Discussed plan for SW to close goals, patient to contact SW as needed with future care coordination needs ? ?Patient Goals/Self-Care Activities ?patient will:  ? - Attend upcoming scheduled office visit ?-Attend a local senior center if she sees an activity of interest ?-Contact SW as needed  ? ?  ?  ? ?Follow Up Plan:  No SW follow up planned at this time. The patient will remain engaged with RN Care Manager and PharmD to address care management needs. ?     ?Daneen Schick, BSW, CDP ?Social Worker, Certified Dementia Practitioner ?[facility] [place] [facility] ?[phone] ? ?   ? ? ? ? ?

## 2021-08-25 NOTE — Patient Instructions (Signed)
Social Worker Visit Information ? ?Goals we discussed today:  ?Patient Goals/Self-Care Activities ?patient will:  ? - Attend upcoming scheduled office visit ?-Attend a local senior center if she sees an activity of interest ?-Contact SW as needed  ? ?Materials Provided: Verbal education about local community centers provided by phone ? ?Patient verbalizes understanding of instructions and care plan provided today and agrees to view in [place]. Active MyChart status confirmed with patient.   ? ?Follow Up Plan:  No SW follow up planned at this time. Please contact me as needed. ? ? ?Daneen Schick, BSW, CDP ?Social Worker, Certified Dementia Practitioner ?[facility] [place] [facility] ?[phone] ? ?   ? ?

## 2021-08-26 ENCOUNTER — Ambulatory Visit: Payer: PPO

## 2021-08-26 VITALS — BP 110/80 | HR 77 | Temp 98.1°F | Ht 65.0 in | Wt 153.0 lb

## 2021-08-26 DIAGNOSIS — I129 Hypertensive chronic kidney disease with stage 1 through stage 4 chronic kidney disease, or unspecified chronic kidney disease: Secondary | ICD-10-CM

## 2021-08-26 NOTE — Progress Notes (Signed)
Pt presents today for bpc. She currently takes amlodipine '5mg'$  at night after dinner, hydrochlorothiazide '25mg'$  in the am , olmesartan '40mg'$  in the am. She reports her readings are still iffy, some days they do still run high.  ?She has been monitoring her bp, bringing in a list from 3/21 up until yesterday after dinner. She reports drinking a lot more water lately. She also walks 5 days a week, 30-23mns. ?BP Readings from Last 3 Encounters:  ?08/26/21 110/80  ?08/11/21 128/76  ?05/05/21 130/74  ? She has also brought in bp monitor from home, it read:  ?Bp: 142/82 ?P:78 ?She reports having the machine for about 2 years.  ?Patient has a follow up appointment on 11/19/2021 @ 11:20.  ? ? ?

## 2021-09-10 ENCOUNTER — Ambulatory Visit
Admission: EM | Admit: 2021-09-10 | Discharge: 2021-09-10 | Disposition: A | Discharge status: Home / Self Care | Payer: PPO | Attending: Family Medicine | Admitting: Family Medicine

## 2021-09-10 ENCOUNTER — Other Ambulatory Visit: Payer: Self-pay | Admitting: Internal Medicine

## 2021-09-10 DIAGNOSIS — S76911A Strain of unspecified muscles, fascia and tendons at thigh level, right thigh, initial encounter: Secondary | ICD-10-CM | POA: Diagnosis not present

## 2021-09-10 MED ORDER — MELOXICAM 7.5 MG PO TABS: 7.5000 mg | ORAL_TABLET | Freq: Every day | ORAL | 0 refills | Status: DC

## 2021-09-10 MED ORDER — KETOROLAC TROMETHAMINE 15 MG/ML IJ SOLN
15.0000 mg | Freq: Once | INTRAMUSCULAR | Status: AC
  Administered 2021-09-10: 15 mg via INTRAMUSCULAR

## 2021-09-10 MED ORDER — TIZANIDINE HCL 2 MG PO TABS: 2.0000 mg | ORAL_TABLET | Freq: Two times a day (BID) | ORAL | 0 refills | Status: DC | PRN

## 2021-09-10 NOTE — ED Triage Notes (Signed)
Pt c/o generalized right lateral thigh area. States pain just feels "sore," describes baseline level of pain that worsens while walking. Was at beach last week and first noticed pain ~7 days ago. States just woke up to pain, denies any direct trauma or injury. States she thought it is b/c she was walking in the sand and may have "pulled something."  ?

## 2021-09-10 NOTE — ED Provider Notes (Signed)
Brooke Brown    CSN: [id number] Arrival date & time: 09/10/21  1348      History   Chief Complaint Chief Complaint  Patient presents with   right leg pain    HPI Brooke Brown is a 71 y.o. female.   HPI Patient presents today with right upper thigh pain.  She reports pain developed abruptly last week while she was on vacation and walking on the beach.  She reports having a pain that was radiating from her mid upper thigh down to her knee.  She denies any known injury.  She reports that she went for a walk yesterday and following her routine exercise of walking she developed worsening aching pain.  She reports the pain is stabilized with rest but worsens with activity.  She denies any swelling or bruising.  Has taken over-the-counter Tylenol without improvement of pain. Past Medical History:  Diagnosis Date   Anxiety    Arthritis    Diabetes mellitus    Grave's disease    Grave's disease    Hyperlipemia    Hypertension     Patient Active Problem List   Diagnosis Date Noted   Colon cancer screening 07/30/2021   Flatulence, eructation and gas pain 07/30/2021   Gastroesophageal reflux disease 07/30/2021   History of colonic polyps 07/30/2021   Right lower quadrant pain 07/30/2021   Constipation 07/30/2021   Osteoarthritis of carpometacarpal (CMC) joint of thumb 03/04/2021   Bilateral hand pain 03/04/2021   Atherosclerosis of aorta (HCC) 08/15/2020   Anosmia 02/27/2020   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 02/27/2020   Hypothyroidism 02/27/2020   Other long term (current) drug therapy 02/27/2020   Overweight with body mass index (BMI) of 27 to 27.9 in adult 12/26/2019   Vitamin D deficiency disease 12/26/2019   Pure hypercholesterolemia 03/29/2019   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   Overweight (BMI 25.0-29.9) 03/29/2019   Right wrist pain 09/30/2015   Unspecified essential hypertension 12/20/2013   Other and  unspecified hyperlipidemia 12/20/2013   Type 2 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 disease, without long-term current use of insulin (HCC) 12/20/2013   Neuralgia 12/20/2013   Neck pain 12/20/2013   Dizziness 12/20/2013    Past Surgical History:  Procedure Laterality Date   ABDOMINAL HYSTERECTOMY     CATARACT EXTRACTION W/ INTRAOCULAR LENS  IMPLANT, BILATERAL Bilateral    CHOLECYSTECTOMY N/A 08/16/2017   Procedure: LAPAROSCOPIC CHOLECYSTECTOMY;  Surgeon: Jimmye Norman, MD;  Location: [facility];  Service: General;  Laterality: N/A;   TUBAL LIGATION      OB History   No obstetric history on file.      Home Medications    Prior to Admission medications   Medication Sig Start Date End Date Taking? Authorizing Provider  meloxicam (MOBIC) 7.5 MG tablet Take 1 tablet (7.5 mg total) by mouth daily. 09/10/21  Yes Bing Neighbors, FNP  tiZANidine (ZANAFLEX) 2 MG tablet Take 1 tablet (2 mg total) by mouth every 12 (twelve) hours as needed for muscle spasms. 09/10/21  Yes Bing Neighbors, FNP  acetaminophen (TYLENOL) 500 MG tablet Take 500 mg by mouth daily as needed for moderate pain or headache.    [provider]  amLODipine (NORVASC) 5 MG tablet Take 1 tablet by mouth once daily 09/10/21   Dorothyann Peng, MD  aspirin EC 81 MG tablet Take 81 mg by mouth daily.    [provider]  atorvastatin (LIPITOR) 20 MG tablet TAKE 1 TABLET  BY MOUTH 3 TIMES A WEEK 08/12/21   Dorothyann Peng, MD  Blood Glucose Monitoring Suppl (ONE TOUCH ULTRA 2) w/Device KIT Use to check blood sugar 3 times a day. Dx code e11.65 02/27/20   Dorothyann Peng, MD  calcium carbonate (OSCAL) 1500 (600 Ca) MG TABS tablet Take 600 mg of elemental calcium by mouth daily. Patient not taking: Reported on 08/26/2021    [provider]  Carboxymethylcellul-Glycerin (LUBRICATING EYE DROPS OP) Place 1 drop into both eyes 2 (two) times daily.    [provider]  hydrochlorothiazide (HYDRODIURIL) 25  MG tablet Take 1 tablet by mouth once daily 03/21/21   Dorothyann Peng, MD  Magnesium 250 MG TABS otc 03/30/19   Dorothyann Peng, MD  metFORMIN (GLUCOPHAGE-XR) 500 MG 24 hr tablet Take 1 tablet by mouth twice daily 08/12/21   Dorothyann Peng, MD  Multiple Vitamin (MULTIVITAMIN WITH MINERALS) TABS tablet Take 1 tablet by mouth daily.    [provider]  olmesartan (BENICAR) 40 MG tablet Take 1 tablet by mouth once daily 09/10/21   Dorothyann Peng, MD  OneTouch [employer]ica Lancets 30G MISC USE AS DIRECTED TO CHECK BLOOD SUGARS ONCE DAILY 05/05/21   Dorothyann Peng, MD  [facility] ULTRA test strip USE AS INSTRUCTED TO CHECK BLOOD SUGARS ONE TIME PER DAY 05/05/21   Dorothyann Peng, MD  SYNTHROID 112 MCG tablet TAKE 1 TABLET BY MOUTH ONCE DAILY MONDAY-SATURDAY  AND  ONE  HALF  TABLET  ON  SUNDAY 06/18/21   Dorothyann Peng, MD    Family History Family History  Problem Relation Age of Onset   Pancreatic cancer Mother    Heart failure Father    Diabetes Brother    Breast cancer Cousin     Social History Social History   Tobacco Use   Smoking status: Never   Smokeless tobacco: Never  Vaping Use   Vaping Use: Never used  Substance Use Topics   Alcohol use: Not Currently    Alcohol/week: 0.0 standard drinks    Comment: rarely   Drug use: No     Allergies   Patient has no known allergies.   Review of Systems Review of Systems Pertinent negatives listed in HPI  Physical Exam Triage Vital Signs ED Triage Vitals  Enc Vitals Group     BP 09/10/21 1423 137/74     Pulse Rate 09/10/21 1423 71     Resp 09/10/21 1423 18     Temp 09/10/21 1423 97.9 F (36.6 C)     Temp Source 09/10/21 1423 Oral     SpO2 09/10/21 1423 98 %     Weight --      Height --      Head Circumference --      Peak Flow --      Pain Score 09/10/21 1424 0     Pain Loc --      Pain Edu? --      Excl. in [place]? --    No data found.  Updated Vital Signs BP 137/74 (BP Location: Left Arm)   Pulse 71   Temp 97.9 F  (36.6 C) (Oral)   Resp 18   SpO2 98%   Visual Acuity Right Eye Distance:   Left Eye Distance:   Bilateral Distance:    Right Eye Near:   Left Eye Near:    Bilateral Near:     Physical Exam Constitutional:      Appearance: Normal appearance.  HENT:     Head: Normocephalic.  Eyes:     Extraocular Movements: Extraocular movements intact.     Pupils: Pupils are equal, round, and reactive to light.  Cardiovascular:     Rate and Rhythm: Normal rate and regular rhythm.  Pulmonary:     Effort: Pulmonary effort is normal.     Breath sounds: Normal breath sounds.  Musculoskeletal:     Right upper leg: Normal. No swelling, tenderness or bony tenderness.     Left upper leg: Normal. No swelling, tenderness or bony tenderness.  Skin:    General: Skin is warm and dry.     Capillary Refill: Capillary refill takes less than 2 seconds.  Neurological:     General: No focal deficit present.     Mental Status: She is alert.    UC Treatments / Results  Labs (all labs ordered are listed, but only abnormal results are displayed) Labs Reviewed - No data to display  EKG   Radiology No results found.  Procedures Procedures (including critical care time)  Medications Ordered in UC Medications  ketorolac (TORADOL) 15 MG/ML injection 15 mg (15 mg Intramuscular Given 09/10/21 1455)    Initial Impression / Assessment and Plan / UC Course  I have reviewed the triage vital signs and the nursing notes.  Pertinent labs & imaging results that were available during my care of the patient were reviewed by me and considered in my medical decision making (see chart for details).    Suspect muscle strain of the right thigh, no obvious injury or deformity noted on exam.  Toradol 15 mg IM given here in clinic today.  Patient advised to follow-up with orthopedics or her primary care provider if her symptoms do not readily improve with prescribed medication therapy. Final Clinical Impressions(s) /  UC Diagnoses   Final diagnoses:  Muscle strain of right thigh, initial encounter   Discharge Instructions   None    ED Prescriptions     Medication Sig Dispense Auth. Provider   meloxicam (MOBIC) 7.5 MG tablet Take 1 tablet (7.5 mg total) by mouth daily. 20 tablet Bing Neighbors, FNP   tiZANidine (ZANAFLEX) 2 MG tablet Take 1 tablet (2 mg total) by mouth every 12 (twelve) hours as needed for muscle spasms. 30 tablet Bing Neighbors, FNP      PDMP not reviewed this encounter.   Bing Neighbors, FNP 09/11/21 [phone]

## 2021-09-11 ENCOUNTER — Ambulatory Visit: Payer: PPO | Admitting: Internal Medicine

## 2021-09-17 ENCOUNTER — Ambulatory Visit
Admission: EM | Admit: 2021-09-17 | Discharge: 2021-09-17 | Disposition: A | Discharge status: Home / Self Care | Payer: PPO | Attending: Internal Medicine | Admitting: Internal Medicine

## 2021-09-17 ENCOUNTER — Encounter: Payer: Self-pay | Admitting: Emergency Medicine

## 2021-09-17 ENCOUNTER — Ambulatory Visit (INDEPENDENT_AMBULATORY_CARE_PROVIDER_SITE_OTHER): Payer: PPO

## 2021-09-17 DIAGNOSIS — M25561 Pain in right knee: Secondary | ICD-10-CM

## 2021-09-17 DIAGNOSIS — S8991XA Unspecified injury of right lower leg, initial encounter: Secondary | ICD-10-CM

## 2021-09-17 NOTE — ED Provider Notes (Signed)
?[place] ? ? ? ?CSN: [id number] ?Arrival date & time: 09/17/21  1644 ? ? ?  ? ?History   ?Chief Complaint ?Chief Complaint  ?Patient presents with  ? Leg Pain  ? ? ?HPI ?Brooke Brown is a 71 y.o. female.  ? ?Patient presents with right knee pain that started today.  Patient reports that she was walking out of her bathroom and she felt a "pop" in her right leg with subsequent pain.  Patient having difficulty bearing weight due to pain.  Denies any numbness or tingling.  Pain is present in the anterior knee and radiates down to lower leg.  Patient has applied heat to the area with minimal improvement. ? ?Patient also has elevated blood pressure reading and reports that she has taken her blood pressure medication.  Denies chest pain, shortness of breath, headache, blurred vision, dizziness, nausea, vomiting. ? ? ?Leg Pain ? ?Past Medical History:  ?Diagnosis Date  ? Anxiety   ? Arthritis   ? Diabetes mellitus   ? Grave's disease   ? Grave's disease   ? Hyperlipemia   ? Hypertension   ? ? ?Patient Active Problem List  ? Diagnosis Date Noted  ? Colon cancer screening 07/30/2021  ? Flatulence, eructation and gas pain 07/30/2021  ? Gastroesophageal reflux disease 07/30/2021  ? History of colonic polyps 07/30/2021  ? Right lower quadrant pain 07/30/2021  ? Constipation 07/30/2021  ? Osteoarthritis of carpometacarpal [facility]) joint of thumb 03/04/2021  ? Bilateral hand pain 03/04/2021  ? Atherosclerosis of aorta ([place]) 08/15/2020  ? Anosmia 02/27/2020  ? 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 Hypothyroidism 02/27/2020  ? Other long term (current) drug therapy 02/27/2020  ? Overweight with body mass index (BMI) of 27 to 27.9 in adult 12/26/2019  ? Vitamin D deficiency disease 12/26/2019  ? Pure hypercholesterolemia 03/29/2019  ? 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  ? Overweight (BMI 25.0-29.9) 03/29/2019  ? Right wrist pain 09/30/2015  ? Unspecified essential  hypertension 12/20/2013  ? Other and unspecified hyperlipidemia 12/20/2013  ? Type 2 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 disease, without long-term current use of insulin ([place]) 12/20/2013  ? Neuralgia 12/20/2013  ? Neck pain 12/20/2013  ? Dizziness 12/20/2013  ? ? ?Past Surgical History:  ?Procedure Laterality Date  ? ABDOMINAL HYSTERECTOMY    ? CATARACT EXTRACTION W/ INTRAOCULAR LENS  IMPLANT, BILATERAL Bilateral   ? CHOLECYSTECTOMY N/A 08/16/2017  ? Procedure: LAPAROSCOPIC CHOLECYSTECTOMY;  Surgeon: Judeth Horn, MD;  Location: [place];  Service: General;  Laterality: N/A;  ? TUBAL LIGATION    ? ? ?OB History   ?No obstetric history on file. ?  ? ? ? ?Home Medications   ? ?Prior to Admission medications   ?Medication Sig Start Date End Date Taking? Authorizing Provider  ?acetaminophen (TYLENOL) 500 MG tablet Take 500 mg by mouth daily as needed for moderate pain or headache.   Yes [provider]  ?amLODipine (NORVASC) 5 MG tablet Take 1 tablet by mouth once daily 09/10/21  Yes Glendale Chard, MD  ?aspirin EC 81 MG tablet Take 81 mg by mouth daily.   Yes [provider]  ?atorvastatin (LIPITOR) 20 MG tablet TAKE 1 TABLET BY MOUTH 3 TIMES A WEEK 08/12/21  Yes Glendale Chard, MD  ?Blood Glucose Monitoring Suppl (ONE TOUCH ULTRA 2) w/Device KIT Use to check blood sugar 3 times a day. Dx code e11.65 02/27/20  Yes Glendale Chard, MD  ?calcium carbonate (OSCAL) 1500 (600  Ca) MG TABS tablet Take 600 mg of elemental calcium by mouth daily.   Yes [provider]  ?Carboxymethylcellul-Glycerin (LUBRICATING EYE DROPS OP) Place 1 drop into both eyes 2 (two) times daily.   Yes [provider]  ?hydrochlorothiazide (HYDRODIURIL) 25 MG tablet Take 1 tablet by mouth once daily 03/21/21  Yes Glendale Chard, MD  ?Magnesium 250 MG TABS otc 03/30/19  Yes Glendale Chard, MD  ?meloxicam (MOBIC) 7.5 MG tablet Take 1 tablet (7.5 mg total) by mouth daily. 09/10/21  Yes Scot Jun, FNP   ?metFORMIN (GLUCOPHAGE-XR) 500 MG 24 hr tablet Take 1 tablet by mouth twice daily 08/12/21  Yes Glendale Chard, MD  ?Multiple Vitamin (MULTIVITAMIN WITH MINERALS) TABS tablet Take 1 tablet by mouth daily.   Yes [provider]  ?olmesartan (BENICAR) 40 MG tablet Take 1 tablet by mouth once daily 09/10/21  Yes Glendale Chard, MD  ?[facility] Lancets 16S MISC USE AS DIRECTED TO CHECK BLOOD SUGARS ONCE DAILY 05/05/21  Yes Glendale Chard, MD  ?[facility] ULTRA test strip USE AS INSTRUCTED TO CHECK BLOOD SUGARS ONE TIME PER DAY 05/05/21  Yes Glendale Chard, MD  ?SYNTHROID 112 MCG tablet TAKE 1 TABLET BY MOUTH ONCE DAILY MONDAY-SATURDAY  AND  ONE  HALF  TABLET  ON  SUNDAY 06/18/21  Yes Glendale Chard, MD  ?tiZANidine (ZANAFLEX) 2 MG tablet Take 1 tablet (2 mg total) by mouth every 12 (twelve) hours as needed for muscle spasms. 09/10/21  Yes Scot Jun, FNP  ? ? ?Family History ?Family History  ?Problem Relation Age of Onset  ? Pancreatic cancer Mother   ? Heart failure Father   ? Diabetes Brother   ? Breast cancer Cousin   ? ? ?Social History ?Social History  ? ?Tobacco Use  ? Smoking status: Never  ? Smokeless tobacco: Never  ?Vaping Use  ? Vaping Use: Never used  ?Substance Use Topics  ? Alcohol use: Not Currently  ?  Alcohol/week: 0.0 standard drinks  ?  Comment: rarely  ? Drug use: No  ? ? ? ?Allergies   ?Patient has no known allergies. ? ? ?Review of Systems ?Review of Systems ?Per HPI ? ?Physical Exam ?Triage Vital Signs ?ED Triage Vitals  ?Enc Vitals Group  ?   BP 09/17/21 1656 (!) 175/70  ?   Pulse Rate 09/17/21 1656 73  ?   Resp 09/17/21 1656 18  ?   Temp 09/17/21 1656 98 ?F (36.7 ?C)  ?   Temp Source 09/17/21 1656 Oral  ?   SpO2 09/17/21 1656 97 %  ?   Weight 09/17/21 1657 153 lb (69.4 kg)  ?   Height 09/17/21 1657 5' 5" (1.651 m)  ?   Head Circumference --   ?   Peak Flow --   ?   Pain Score 09/17/21 1656 7  ?   Pain Loc --   ?   Pain Edu? --   ?   Excl. in [place]? --   ? ?No data found. ? ?Updated  Vital Signs ?BP (!) 175/70 (BP Location: Left Arm)   Pulse 73   Temp 98 ?F (36.7 ?C) (Oral)   Resp 18   Ht 5' 5" (1.651 m)   Wt 153 lb (69.4 kg)   SpO2 97%   BMI 25.46 kg/m?  ? ?Visual Acuity ?Right Eye Distance:   ?Left Eye Distance:   ?Bilateral Distance:   ? ?Right Eye Near:   ?Left Eye Near:    ?  Bilateral Near:    ? ?Physical Exam ?Constitutional:   ?   General: She is not in acute distress. ?   Appearance: Normal appearance. She is not toxic-appearing or diaphoretic.  ?HENT:  ?   Head: Normocephalic and atraumatic.  ?Eyes:  ?   Extraocular Movements: Extraocular movements intact.  ?   Conjunctiva/sclera: Conjunctivae normal.  ?   Pupils: Pupils are equal, round, and reactive to light.  ?Cardiovascular:  ?   Rate and Rhythm: Normal rate and regular rhythm.  ?   Pulses: Normal pulses.  ?   Heart sounds: Normal heart sounds.  ?Pulmonary:  ?   Effort: Pulmonary effort is normal. No respiratory distress.  ?   Breath sounds: Normal breath sounds.  ?Musculoskeletal:  ?   Right knee: No swelling, deformity, erythema or crepitus. Decreased range of motion. Tenderness present. No LCL laxity, MCL laxity, ACL laxity or PCL laxity. Normal alignment, normal meniscus and normal patellar mobility. Normal pulse.  ?   Left knee: Normal.  ?   Comments: Tenderness to palpation directly over patella and directly beneath patella.  No obvious swelling or discoloration noted.  Patient has full range of motion but with pain.  No crepitus noted.  Neurovascular intact.  ?Neurological:  ?   General: No focal deficit present.  ?   Mental Status: She is alert and oriented to person, place, and time. Mental status is at baseline.  ?   Cranial Nerves: Cranial nerves 2-12 are intact.  ?   Sensory: Sensation is intact.  ?   Motor: Motor function is intact.  ?   Coordination: Coordination is intact.  ?   Gait: Gait is intact.  ?Psychiatric:     ?   Mood and Affect: Mood normal.     ?   Behavior: Behavior normal.     ?   Thought Content:  Thought content normal.     ?   Judgment: Judgment normal.  ? ? ? ?UC Treatments / Results  ?Labs ?(all labs ordered are listed, but only abnormal results are displayed) ?Labs Reviewed - No data to display ? ?EKG ? ? ?Ra

## 2021-09-17 NOTE — Discharge Instructions (Addendum)
Your knee x-ray was negative for any fracture or dislocation.  A knee brace has been applied.  Please use ice application.  Follow-up with provided contact information for orthopedist for further evaluation and management. ?

## 2021-09-17 NOTE — ED Triage Notes (Signed)
Patient states that she was walking out of her bathroom today, she felt something "pop" in her right leg.  She has been unable to put any weight on her leg.  The pain radiates down her knee to her lower leg.  She has applied heat to the area. ?

## 2021-09-19 ENCOUNTER — Ambulatory Visit (INDEPENDENT_AMBULATORY_CARE_PROVIDER_SITE_OTHER): Payer: PPO | Admitting: Physician Assistant

## 2021-09-19 DIAGNOSIS — M25561 Pain in right knee: Secondary | ICD-10-CM | POA: Diagnosis not present

## 2021-09-19 NOTE — Progress Notes (Signed)
? ?Office Visit Note ?  ?Patient: Brooke Brown           ?Date of Birth: 1951-03-29           ?MRN: [id number] ?Visit Date: 09/19/2021 ?             ?Requested by: Glendale Chard, MD ?[address] ?[address] ?[place],  [place] [id number] ?PCP: Glendale Chard, MD ? ?Chief Complaint  ?Patient presents with  ? Right Knee - Pain  ? ? ? ? ?HPI: ?Patient is a pleasant 71 year old woman with a chief complaint of right knee pain.  She thinks this started initially when she made a trip to the beach a few weeks ago and did quite a bit of walking on sand.  She said it was getting better and then earlier this week she went to go walk at a park and she and felt a pop in her leg.  She had difficulty bearing weight after that she had locking initially and popping but that is now improved.  She was seen and evaluated in urgent care where an x-ray showed some degenerative changes but no acute changes.  She does feel like she is improving ? ?Assessment & Plan: ?Visit Diagnoses: Right knee pain ? ?Plan: Right knee pain meniscus pathology or arthritis.  I would like for her to follow-up in 2 weeks.  She should wear the brace if she as needed for comfort.  She will be on a regular course of anti-inflammatories.  She wishes to defer an injection and would like to see how things go ? ?Follow-Up Instructions: No follow-ups on file.  ? ?Ortho Exam ? ?Patient is alert, oriented, no adenopathy, well-dressed, normal affect, normal respiratory effort. ?Examination of her right knee she has no real effusion no warmth no cellulitis she does have some tenderness over the medial joint line.  No significant crepitus.  She has good varus valgus stability ? ?Imaging: ?No results found. ?No images are attached to the encounter. ? ?Labs: ?Lab Results  ?Component Value Date  ? HGBA1C 6.4 (H) 08/11/2021  ? HGBA1C 6.4 (H) 05/05/2021  ? HGBA1C 6.5 (H) 12/31/2020  ? ESRSEDRATE 19 08/29/2018  ? LABURIC 5.0 08/29/2018  ? ? ? ?Lab Results  ?Component  Value Date  ? ALBUMIN 4.3 08/11/2021  ? ALBUMIN 4.5 12/31/2020  ? ALBUMIN 4.3 08/15/2020  ? ? ?Lab Results  ?Component Value Date  ? MG 2.2 03/29/2019  ? ?Lab Results  ?Component Value Date  ? VD25OH 128.0 (H) 05/05/2021  ? VD25OH 109.0 (H) 12/31/2020  ? VD25OH 112.0 (H) 12/26/2019  ? ? ?No results found for: PREALBUMIN ? ?  Latest Ref Rng & Units 12/31/2020  ? 10:41 AM 12/26/2019  ?  4:42 PM 12/19/2018  ?  1:02 PM  ?CBC EXTENDED  ?WBC 3.4 - 10.8 x10E3/uL 5.5   5.4   4.8    ?RBC 3.77 - 5.28 x10E6/uL 4.63   4.48   4.46    ?Hemoglobin 11.1 - 15.9 g/dL 13.0   12.6   12.7    ?HCT 34.0 - 46.6 % 39.6   36.9   37.6    ?Platelets 150 - 450 x10E3/uL 344   360   351    ? ? ? ?There is no height or weight on file to calculate BMI. ? ?Orders:  ?No orders of the defined types were placed in this encounter. ? ?No orders of the defined types were placed in this encounter. ? ? ?  Procedures: ?No procedures performed ? ?Clinical Data: ?No additional findings. ? ?ROS: ? ?All other systems negative, except as noted in the HPI. ?Review of Systems ? ?Objective: ?Vital Signs: There were no vitals taken for this visit. ? ?Specialty Comments:  ?No specialty comments available. ? ?PMFS History: ?Patient Active Problem List  ? Diagnosis Date Noted  ? Colon cancer screening 07/30/2021  ? Flatulence, eructation and gas pain 07/30/2021  ? Gastroesophageal reflux disease 07/30/2021  ? History of colonic polyps 07/30/2021  ? Right lower quadrant pain 07/30/2021  ? Constipation 07/30/2021  ? Osteoarthritis of carpometacarpal [facility]) joint of thumb 03/04/2021  ? Bilateral hand pain 03/04/2021  ? Atherosclerosis of aorta ([place]) 08/15/2020  ? Anosmia 02/27/2020  ? 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 Hypothyroidism 02/27/2020  ? Other long term (current) drug therapy 02/27/2020  ? Overweight with body mass index (BMI) of 27 to 27.9 in adult 12/26/2019  ? Vitamin D deficiency disease 12/26/2019  ? Pure hypercholesterolemia  03/29/2019  ? 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  ? Overweight (BMI 25.0-29.9) 03/29/2019  ? Right wrist pain 09/30/2015  ? Unspecified essential hypertension 12/20/2013  ? Other and unspecified hyperlipidemia 12/20/2013  ? Type 2 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 disease, without long-term current use of insulin ([place]) 12/20/2013  ? Neuralgia 12/20/2013  ? Neck pain 12/20/2013  ? Dizziness 12/20/2013  ? ?Past Medical History:  ?Diagnosis Date  ? Anxiety   ? Arthritis   ? Diabetes mellitus   ? Grave's disease   ? Grave's disease   ? Hyperlipemia   ? Hypertension   ?  ?Family History  ?Problem Relation Age of Onset  ? Pancreatic cancer Mother   ? Heart failure Father   ? Diabetes Brother   ? Breast cancer Cousin   ?  ?Past Surgical History:  ?Procedure Laterality Date  ? ABDOMINAL HYSTERECTOMY    ? CATARACT EXTRACTION W/ INTRAOCULAR LENS  IMPLANT, BILATERAL Bilateral   ? CHOLECYSTECTOMY N/A 08/16/2017  ? Procedure: LAPAROSCOPIC CHOLECYSTECTOMY;  Surgeon: Judeth Horn, MD;  Location: [place];  Service: General;  Laterality: N/A;  ? TUBAL LIGATION    ? ?Social History  ? ?Occupational History  ? Occupation: retired  ?Tobacco Use  ? Smoking status: Never  ? Smokeless tobacco: Never  ?Vaping Use  ? Vaping Use: Never used  ?Substance and Sexual Activity  ? Alcohol use: Not Currently  ?  Alcohol/week: 0.0 standard drinks  ?  Comment: rarely  ? Drug use: No  ? Sexual activity: Not Currently  ? ? ? ? ? ?

## 2021-09-21 DIAGNOSIS — N182 Chronic kidney disease, stage 2 (mild): Secondary | ICD-10-CM

## 2021-09-21 DIAGNOSIS — E1122 Type 2 diabetes mellitus with diabetic chronic kidney disease: Secondary | ICD-10-CM

## 2021-09-21 DIAGNOSIS — I129 Hypertensive chronic kidney disease with stage 1 through stage 4 chronic kidney disease, or unspecified chronic kidney disease: Secondary | ICD-10-CM

## 2021-09-21 DIAGNOSIS — E782 Mixed hyperlipidemia: Secondary | ICD-10-CM

## 2021-09-26 ENCOUNTER — Telehealth: Payer: Self-pay

## 2021-09-26 NOTE — Chronic Care Management (AMB) (Signed)
? ? ?  Called [place], No answer, left message of appointment on 09-30-2021 at 11:15 via telephone visit with Orlando Penner, Pharm D. Notified to have all medications, supplements, blood pressure and/or blood sugar logs available during appointment and to return call if need to reschedule. ? ? ?Malecca Hicks CMA ?Clinical Pharmacist Assistant ?[phone] ? ? ?

## 2021-09-30 ENCOUNTER — Ambulatory Visit (INDEPENDENT_AMBULATORY_CARE_PROVIDER_SITE_OTHER): Payer: PPO

## 2021-09-30 ENCOUNTER — Telehealth: Payer: PPO

## 2021-09-30 DIAGNOSIS — E782 Mixed hyperlipidemia: Secondary | ICD-10-CM

## 2021-09-30 DIAGNOSIS — I129 Hypertensive chronic kidney disease with stage 1 through stage 4 chronic kidney disease, or unspecified chronic kidney disease: Secondary | ICD-10-CM

## 2021-09-30 NOTE — Progress Notes (Signed)
? ?Chronic Care Management ?Pharmacy Note ? ?10/03/2021 ?Name:  Brooke Brown MRN:  [id number] DOB:  April 13, 1951 ? ?Summary: ?Patient reports that she is doing well and is happy with the decrease in cost of her cholesterol and thyroid medication. ? ?Recommendations/Changes made from today's visit: ?Recommend patient stop taking Alive MV because it has vitamin D and calcium in it. ?Recommend patient receive the COVID-19 Booster.  ?Patient reports still having elevated BP readings at home. ? ?Plan: ?Patient reports that she is going to the COVID-19 booster next week.  ?Patient is no longer going to take ALIVE MV, due to its Vitamin D and Calcium content ?Collaborate with PCP to determine if patient should continue to have nurse visits every 2 weeks and or a follow up call from CCM team to determine how their BP doing.  ? ? ?Subjective: ?Brooke Brown is an 71 y.o. year old female who is a primary patient of Glendale Chard, MD.  The CCM team was consulted for assistance with disease management and care coordination needs.   ? ?Engaged with patient by telephone for follow up visit in response to provider referral for pharmacy case management and/or care coordination services.  ? ?Consent to Services:  ?The patient was given information about Chronic Care Management services, agreed to services, and gave verbal consent prior to initiation of services.  Please see initial visit note for detailed documentation.  ? ?Patient Care Team: ?Glendale Chard, MD as PCP - General (Internal Medicine) ?Little, Claudette Stapler, RN as Case Manager ?Mayford Knife, RPH (Pharmacist) ? ?Recent office visits: ?08/11/2021 PCP [facility] ? ?Recent consult visits: ?09/19/2021 Orthopedics OV  ?06/26/2021 Opthalmology OV ? ?Hospital visits: ?09/10/2021 Urgent Care Appointment for right knee muscle strain ? ? ?Objective: ? ?Lab Results  ?Component Value Date  ? CREATININE 0.80 08/11/2021  ? BUN 12 08/11/2021  ? EGFR 78 08/11/2021  ? GFRNONAA  70 04/30/2020  ? GFRAA 81 04/30/2020  ? NA 143 08/11/2021  ? K 4.2 08/11/2021  ? CALCIUM 10.7 (H) 08/11/2021  ? CO2 26 08/11/2021  ? GLUCOSE 99 08/11/2021  ? ? ?Lab Results  ?Component Value Date/Time  ? HGBA1C 6.4 (H) 08/11/2021 05:20 PM  ? HGBA1C 6.4 (H) 05/05/2021 10:21 AM  ? MICROALBUR 30 12/31/2020 10:38 AM  ? MICROALBUR 30 12/26/2019 04:50 PM  ?  ?Last diabetic Eye exam:  ?Lab Results  ?Component Value Date/Time  ? HMDIABEYEEXA No Retinopathy 06/26/2020 12:00 AM  ?  ?Last diabetic Foot exam: No results found for: HMDIABFOOTEX  ? ?Lab Results  ?Component Value Date  ? CHOL 159 08/15/2020  ? HDL 54 08/15/2020  ? [place] 80 08/15/2020  ? TRIG 144 08/15/2020  ? CHOLHDL 2.9 08/15/2020  ? ? ? ?  Latest Ref Rng & Units 08/11/2021  ?  5:20 PM 12/31/2020  ? 10:41 AM 08/15/2020  ? 10:53 AM  ?Hepatic Function  ?Total Protein 6.0 - 8.5 g/dL 7.8   7.4   7.4    ?Albumin 3.7 - 4.7 g/dL 4.3   4.5   4.3    ?AST 0 - 40 IU/L _0 ?ALT 0 - 32 IU/L _1 ?Alk Phosphatase 44 - 121 IU/L 56   56   55    ?Total Bilirubin 0.0 - 1.2 mg/dL <0.2   0.3   0.3    ? ? ?Lab Results  ?Component Value Date/Time  ?  TSH 2.180 05/05/2021 10:21 AM  ? TSH 1.160 12/31/2020 10:41 AM  ? FREET4 1.50 12/31/2020 10:41 AM  ? FREET4 1.66 04/30/2020 11:37 AM  ? ? ? ?  Latest Ref Rng & Units 12/31/2020  ? 10:41 AM 12/26/2019  ?  4:42 PM 12/19/2018  ?  1:02 PM  ?CBC  ?WBC 3.4 - 10.8 x10E3/uL 5.5   5.4   4.8    ?Hemoglobin 11.1 - 15.9 g/dL 13.0   12.6   12.7    ?Hematocrit 34.0 - 46.6 % 39.6   36.9   37.6    ?Platelets 150 - 450 x10E3/uL 344   360   351    ? ? ?Lab Results  ?Component Value Date/Time  ? VD25OH 128.0 (H) 05/05/2021 10:21 AM  ? VD25OH 109.0 (H) 12/31/2020 10:41 AM  ? ? ?Clinical ASCVD: Yes  ?The 10-year ASCVD risk score (Arnett DK, et al., 2019) is: 54% ?  Values used to calculate the score: ?    Age: 71 years ?    Sex: Female ?    Is Non-Hispanic African American: Yes ?    Diabetic: Yes ?    Tobacco smoker: Yes ?    Systolic Blood  Pressure: 156 mmHg ?    Is BP treated: Yes ?    HDL Cholesterol: 54 mg/dL ?    Total Cholesterol: 159 mg/dL   ? ? ?  06/12/2021  ?  9:54 AM 08/29/2020  ?  3:32 PM 12/25/2019  ? 12:15 PM  ?Depression screen PHQ 2/9  ?Decreased Interest 0 0 0  ?Down, Depressed, Hopeless 0 0 0  ?PHQ - 2 Score 0 0 0  ?  ? ? ?Social History  ? ?Tobacco Use  ?Smoking Status Never  ?Smokeless Tobacco Never  ? ?BP Readings from Last 3 Encounters:  ?09/17/21 (!) 156/82  ?09/10/21 137/74  ?08/26/21 110/80  ? ?Pulse Readings from Last 3 Encounters:  ?09/17/21 69  ?09/10/21 71  ?08/26/21 77  ? ?Wt Readings from Last 3 Encounters:  ?09/17/21 153 lb (69.4 kg)  ?08/26/21 153 lb (69.4 kg)  ?08/11/21 153 lb 9.6 oz (69.7 kg)  ? ?BMI Readings from Last 3 Encounters:  ?09/17/21 25.46 kg/m?  ?08/26/21 25.46 kg/m?  ?08/11/21 25.56 kg/m?  ? ? ?Assessment/Interventions: Review of patient past medical history, allergies, medications, health status, including review of consultants reports, laboratory and other test data, was performed as part of comprehensive evaluation and provision of chronic care management services.  ? ?SDOH:  (Social Determinants of Health) assessments and interventions performed: No ? ?SDOH Screenings  ? ?Alcohol Screen: Not on file  ?Depression (PHQ2-9): Low Risk   ? PHQ-2 Score: 0  ?Financial Resource Strain: Low Risk   ? Difficulty of Paying Living Expenses: Not hard at all  ?Food Insecurity: No Food Insecurity  ? Worried About Charity fundraiser in the Last Year: Never true  ? Ran Out of Food in the Last Year: Never true  ?Housing: Low Risk   ? Last Housing Risk Score: 0  ?Physical Activity: Sufficiently Active  ? Days of Exercise per Week: 3 days  ? Minutes of Exercise per Session: 60 min  ?Social Connections: Unknown  ? Frequency of Communication with Friends and Family: More than three times a week  ? Frequency of Social Gatherings with Friends and Family: Twice a week  ? Attends Religious Services: Not on file  ? Active Member of  Clubs or Organizations: Not on file  ? Attends  Club or Organization Meetings: Not on file  ? Marital Status: Not on file  ?Stress: No Stress Concern Present  ? Feeling of Stress : Only a little  ?Tobacco Use: Low Risk   ? Smoking Tobacco Use: Never  ? Smokeless Tobacco Use: Never  ? Passive Exposure: Not on file  ?Transportation Needs: No Transportation Needs  ? Lack of Transportation (Medical): No  ? Lack of Transportation (Non-Medical): No  ? ? ?CCM Care Plan ? ?No Known Allergies ? ?Medications Reviewed Today   ? ? Reviewed by Persons, Bevely Palmer, PA (Physician Assistant) on 09/19/21 at 1128  Med List Status: <None>  ? ?Medication Order Taking? Sig Documenting Provider Last Dose Status Informant  ?acetaminophen (TYLENOL) 500 MG tablet [id number] No Take 500 mg by mouth daily as needed for moderate pain or headache. [provider] 09/17/2021 Active Self  ?amLODipine (NORVASC) 5 MG tablet [id number] No Take 1 tablet by mouth once daily Glendale Chard, MD 09/17/2021 Active   ?aspirin EC 81 MG tablet [id number] No Take 81 mg by mouth daily. [provider] 09/17/2021 Active Self  ?atorvastatin (LIPITOR) 20 MG tablet [id number] No TAKE 1 TABLET BY MOUTH 3 TIMES A Dayna Barker, MD 09/17/2021 Active   ?Blood Glucose Monitoring Suppl (ONE TOUCH ULTRA 2) w/Device KIT [id number] No Use to check blood sugar 3 times a day. Dx code e11.65 Glendale Chard, MD 09/17/2021 Active   ?calcium carbonate (OSCAL) 1500 (600 Ca) MG TABS tablet [id number] No Take 600 mg of elemental calcium by mouth daily. [provider] 09/17/2021 Active Self  ?Carboxymethylcellul-Glycerin (LUBRICATING EYE DROPS OP) [id number] No Place 1 drop into both eyes 2 (two) times daily. [provider] 09/17/2021 Active Self  ?hydrochlorothiazide (HYDRODIURIL) 25 MG tablet [id number] No Take 1 tablet by mouth once daily Glendale Chard, MD 09/17/2021 Active   ?Magnesium 250 MG TABS [id number] No Matilde Haymaker, MD 09/17/2021 Active    ?meloxicam (MOBIC) 7.5 MG tablet [id number] No Take 1 tablet (7.5 mg total) by mouth daily. Scot Jun, FNP 09/17/2021 Active   ?metFORMIN (GLUCOPHAGE-XR) 500 MG 24 hr tablet [id number] No Take 1 tabl

## 2021-10-03 NOTE — Patient Instructions (Signed)
Visit Information ?It was great speaking with you today!  Please let me know if you have any questions about our visit. ? ? Goals Addressed   ? ?  ?  ?  ?  ? This Visit's Progress  ?  Manage My Medicine     ?  Timeframe:  Long-Range Goal ?Priority:  High ?Start Date:                             ?Expected End Date:                      ? ?Follow Up Date 11/04/2021 ?  ?- call for medicine refill 2 or 3 days before it runs out ?- keep a list of all the medicines I take; vitamins and herbals too  ?  ?Why is this important?   ?These steps will help you keep on track with your medicines. ?  ?Notes:  ?-Please call if you have any questions about your medications  ?  ? ?  ? ? ?Patient Care Plan: RN Care Manager Plan of Care  ?  ? ?Problem Identified: No plan established with management of chronic disease states (Type 2 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 disease, without long-term current use of insulin, Benign hypertensive renal disease,Postablative hypothyroidism)   ?Priority: High  ?  ? ?Long-Range Goal: Establishment of plan of care for management of chronic disease states (Type 2 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 disease, without long-term current use of insulin, Benign hypertensive renal disease,Postablative hypothyroidism)   ?Start Date: 05/28/2021  ?Expected End Date: 05/28/2022  ?Recent Progress: On track  ?Priority: High  ?Note:   ?Current Barriers:  ?Knowledge Deficits related to plan of care for management of Type 2 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 disease, without long-term current use of insulin, Benign hypertensive renal disease,Postablative hypothyroidism  ?Chronic Disease Management support and education needs related to Type 2 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 disease, without long-term current use of insulin, Benign hypertensive renal disease,Postablative hypothyroidism  ? ?RNCM Clinical Goal(s):  ?Patient will verbalize basic understanding of  Type 2 diabetes  mellitus with stage 2 chronic kidney disease, without long-term current use of insulin, Benign hypertensive renal disease,Postablative hypothyroidism disease process and self health management plan as evidenced by patient will report having no disease exacerbations related to her chronic disease states as listed above ?take all medications exactly as prescribed and will call provider for medication related questions as evidenced by demonstrate improved understanding of prescribed medications and rationale for usage as evidenced by patient teach back ?demonstrate Ongoing health management independence as evidenced by patient will report 100% adherence to her prescribed treatment plan  ?continue to work with RN Care Manager to address care management and care coordination needs related to  Type 2 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 disease, without long-term current use of insulin, Benign hypertensive renal disease,Postablative hypothyroidism as evidenced by adherence to CM Team Scheduled appointments ?demonstrate ongoing self health care management ability   as evidenced by    through collaboration with RN Care manager, provider, and care team.  ? ?Interventions: ?1:1 collaboration with primary care provider regarding development and update of comprehensive plan of care as evidenced by provider attestation and co-signature ?Inter-disciplinary care team collaboration (see longitudinal plan of care) ?Evaluation of current treatment plan related to  self management and patient's adherence to plan as  established by provider ? ?Interdisciplinary Collaboration Interventions:  (Status: Goal on track:  Yes.) Long Term Goal   ?Collaborated with BSW to initiate plan of care to address needs related to Limited education about Advanced Directives* in patient with  Type 2 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 disease, without long-term current use of insulin, Benign hypertensive renal disease,Postablative  hypothyroidism ?Collaboration with Glendale Chard, MD regarding development and update of comprehensive plan of care as evidenced by provider attestation and co-signature ?Inter-disciplinary care team collaboration  ? ?Diabetes Interventions:  (Status:  Goal on track:  Yes.) Long Term Goal ?Assessed patient's understanding of A1c goal: <6.5% ?Provided education to patient about basic DM disease process ?Reviewed medications with patient and discussed importance of medication adherence ?Counseled on importance of regular laboratory monitoring as prescribed ?Review of patient status, including review of consultants reports, relevant laboratory and other test results, and medications completed ?Educated patient on dietary and exercise recommendations  ?Mailed printed educational materials related to [employer]; Chair Exercises ?Lab Results  ?Component Value Date  ? HGBA1C 6.4 (H) 08/11/2021  ?Hypertension Interventions:  (Status:  Goal on track:  Yes.) Long Term Goal ?Last practice recorded BP readings:  ?BP Readings from Last 3 Encounters:  ?08/11/21 128/76  ?05/05/21 130/74  ?12/31/20 130/68  ?Most recent eGFR/CrCl:  ?Lab Results  ?Component Value Date  ? EGFR 78 08/11/2021  ?  No components found for: CRCL ?Evaluation of current treatment plan related to hypertension self management and patient's adherence to plan as established by provider ?Counseled on the importance of exercise goals with target of 150 minutes per week ?Advised patient, providing education and rationale, to monitor blood pressure daily and record, calling PCP for findings outside established parameters ?Provided education on prescribed diet low Sodium diet  ?Discussed complications of poorly controlled blood pressure such as heart disease, stroke, circulatory complications, vision complications, kidney impairment, sexual dysfunction ?Educated patient on target BP <130/80 ?Mailed printed educational materials related to Why Should I Restrict  Sodium?; Understanding Blood Pressure Readings ?Reviewed scheduled/upcoming provider appointment including: next PCP follow up appointment for BP check scheduled for 08/26/21 _0  AM ? ?Patient Goals/Self-Care Activities: ?Take all medications as prescribed ?Attend all scheduled provider appointments ?Call pharmacy for medication refills 3-7 days in advance of running out of medications ?Perform all self care activities independently  ?Perform IADL's (shopping, preparing meals, housekeeping, managing finances) independently ?Call provider office for new concerns or questions  ?Work with the Education officer, museum to address care coordination needs and will continue to work with the clinical team to address health care and disease management related needs ?drink 6 to 8 glasses of water each day ?fill half of plate with vegetables ?manage portion size ?learn about high blood pressure ?take blood pressure log to all doctor appointments ?call doctor for signs and symptoms of high blood pressure ?take medications for blood pressure exactly as prescribed ?report new symptoms to your doctor ? ?Follow Up Plan:  Telephone follow up appointment with care management team member scheduled for:  11/21/21 ? ?  ? ?Patient Care Plan: Social Work Plan of Care  ?Completed 08/25/2021  ? ?Problem Identified: Quality of Life (General Plan of Care) Resolved 08/25/2021  ?  ? ?Goal: Advance Directive Education Completed 06/25/2021  ?Start Date: 05/28/2021  ?Priority: High  ?Note:   ?Current Barriers:  ?Chronic disease management support and education needs related to HTN, HLD, and DM  ?Limited education of the importance of an Advance Directive ? ?Education officer, museum  Clinical Goal(s):  ?patient will work with SW to identify and address any acute and/or chronic care coordination needs related to the self health management of HTN, DM, and HLD   ?Patient will work with SW to gain a better understanding of the importance of completing an Advance Directive ?SW  Interventions:  ?Inter-disciplinary care team collaboration (see longitudinal plan of care) ?Collaboration with Glendale Chard, MD regarding development and update of comprehensive plan of care as evidenced by provide

## 2021-10-07 ENCOUNTER — Encounter: Payer: Self-pay | Admitting: Orthopaedic Surgery

## 2021-10-07 ENCOUNTER — Ambulatory Visit (INDEPENDENT_AMBULATORY_CARE_PROVIDER_SITE_OTHER): Payer: PPO | Admitting: Orthopaedic Surgery

## 2021-10-07 DIAGNOSIS — M25561 Pain in right knee: Secondary | ICD-10-CM

## 2021-10-07 MED ORDER — METHYLPREDNISOLONE ACETATE 40 MG/ML IJ SUSP
80.0000 mg | INTRAMUSCULAR | Status: AC | PRN
  Administered 2021-10-07: 80 mg via INTRA_ARTICULAR

## 2021-10-07 MED ORDER — BUPIVACAINE HCL 0.25 % IJ SOLN
2.0000 mL | INTRAMUSCULAR | Status: AC | PRN
  Administered 2021-10-07: 2 mL via INTRA_ARTICULAR

## 2021-10-07 MED ORDER — LIDOCAINE HCL 1 % IJ SOLN
2.0000 mL | INTRAMUSCULAR | Status: AC | PRN
  Administered 2021-10-07: 2 mL

## 2021-10-07 NOTE — Progress Notes (Signed)
? ?Office Visit Note ?  ?Patient: Brooke Brown           ?Date of Birth: [birth year]           ?MRN: [id number] ?Visit Date: 10/07/2021 ?             ?Requested by: Glendale Chard, MD ?[address] ?[address] ?[place],  [place] [address] ?PCP: Glendale Chard, MD ? ? ?Assessment & Plan: ?Visit Diagnoses: Right knee pain ? ?Plan: Patient presents in follow-up today for her right knee pain.  She has been using a brace and feels its been helping.  She takes extra strength Tylenol twice daily.  She still has intermittent catching that can cause significant pain.  She does have some arthritic changes in her knee cannot rule out a meniscal tear.  Based on her intermittent symptoms she may benefit from an injection.  If she does not get good relief from the injection would recommend an MRI to evaluate for meniscal tear ? ?Follow-Up Instructions: No follow-ups on file.  ? ?Orders:  ?No orders of the defined types were placed in this encounter. ? ?No orders of the defined types were placed in this encounter. ? ? ? ? Procedures: ?Large Joint Inj: R knee on 10/07/2021 2:23 PM ?Indications: pain and diagnostic evaluation ?Details: 25 G 1.5 in needle, anteromedial approach ? ?Arthrogram: No ? ?Medications: 80 mg methylPREDNISolone acetate 40 MG/ML; 2 mL lidocaine 1 %; 2 mL bupivacaine 0.25 % ?Outcome: tolerated well, no immediate complications ?Procedure, treatment alternatives, risks and benefits explained, specific risks discussed. Consent was given by the patient.  ? ? ? ?Clinical Data: ?No additional findings. ? ? ?Subjective: ?Chief Complaint  ?Patient presents with  ?? Right Knee - Follow-up  ? ?Patient presents today for follow up of her right knee pain. She states that she has been wearing her knee brace while she is walking and states that while she is in the house she has been wearing her knee sleeve. Reports that she still has been having slight catching pains noticed more while she is walking. At this time she  is currently taking OTC extra strength tylenol for pain.  ? ?Review of Systems  ?All other systems reviewed and are negative. ? ? ?Objective: ?Vital Signs: There were no vitals taken for this visit. ? ?Physical Exam ?Vitals reviewed.  ? ? ?Ortho Exam ?Examination of her knee she has no effusion no redness no cellulitis she does have some tenderness over the posterior medial side of the knee.  Good varus valgus and anterior stability ?Specialty Comments:  ?No specialty comments available. ? ?Imaging: ?No results found. ? ? ?PMFS History: ?Patient Active Problem List  ? Diagnosis Date Noted  ?? Pain in right knee 09/19/2021  ?? Colon cancer screening 07/30/2021  ?? Flatulence, eructation and gas pain 07/30/2021  ?? Gastroesophageal reflux disease 07/30/2021  ?? History of colonic polyps 07/30/2021  ?? Right lower quadrant pain 07/30/2021  ?? Constipation 07/30/2021  ?? Osteoarthritis of carpometacarpal (CMC) joint of thumb 03/04/2021  ?? Bilateral hand pain 03/04/2021  ?? Atherosclerosis of aorta ([place]) 08/15/2020  ?? Anosmia 02/27/2020  ?? 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 02/27/2020  ?? Hypothyroidism 02/27/2020  ?? Other long term (current) drug therapy 02/27/2020  ?? Overweight with body mass index (BMI) of 27 to 27.9 in adult 12/26/2019  ?? Vitamin D deficiency disease 12/26/2019  ?? Pure hypercholesterolemia 03/29/2019  ?? 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  ?? Overweight (BMI 25.0-29.9) 03/29/2019  ??  Right wrist pain 09/30/2015  ?? Unspecified essential hypertension 12/20/2013  ?? Other and unspecified hyperlipidemia 12/20/2013  ?? Type 2 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 disease, without long-term current use of insulin ([place]) 12/20/2013  ?? Neuralgia 12/20/2013  ?? Neck pain 12/20/2013  ?? Dizziness 12/20/2013  ? ?Past Medical History:  ?Diagnosis Date  ?? Anxiety   ?? Arthritis   ?? Diabetes mellitus   ?? Grave's disease   ?? Grave's disease   ?? Hyperlipemia   ??  Hypertension   ?  ?Family History  ?Problem Relation Age of Onset  ?? Pancreatic cancer Mother   ?? Heart failure Father   ?? Diabetes Brother   ?? Breast cancer Cousin   ?  ?Past Surgical History:  ?Procedure Laterality Date  ?? ABDOMINAL HYSTERECTOMY    ?? CATARACT EXTRACTION W/ INTRAOCULAR LENS  IMPLANT, BILATERAL Bilateral   ?? CHOLECYSTECTOMY N/A 08/16/2017  ? Procedure: LAPAROSCOPIC CHOLECYSTECTOMY;  Surgeon: Judeth Horn, MD;  Location: [facility];  Service: General;  Laterality: N/A;  ?? TUBAL LIGATION    ? ?Social History  ? ?Occupational History  ?? Occupation: retired  ?Tobacco Use  ?? Smoking status: Never  ?? Smokeless tobacco: Never  ?Vaping Use  ?? Vaping Use: Never used  ?Substance and Sexual Activity  ?? Alcohol use: Not Currently  ?  Alcohol/week: 0.0 standard drinks  ?  Comment: rarely  ?? Drug use: No  ?? Sexual activity: Not Currently  ? ? ? ? ? ? ?

## 2021-10-16 ENCOUNTER — Telehealth: Payer: Self-pay | Admitting: Physician Assistant

## 2021-10-16 NOTE — Telephone Encounter (Signed)
Pt called requesting a call back from PA Persons. Pt did not leave a reason for call back. Pt phone number is [phone].

## 2021-10-22 DIAGNOSIS — I10 Essential (primary) hypertension: Secondary | ICD-10-CM | POA: Diagnosis not present

## 2021-10-22 DIAGNOSIS — E785 Hyperlipidemia, unspecified: Secondary | ICD-10-CM | POA: Diagnosis not present

## 2021-10-30 ENCOUNTER — Telehealth: Payer: Self-pay

## 2021-10-30 NOTE — Chronic Care Management (AMB) (Signed)
   Left patient a VM to call and reschedule 11-04-2021 appointment with Orlando Penner CPP since she has an appointment with PCP this month.   [facility] Pharmacist Assistant [phone]

## 2021-10-31 ENCOUNTER — Ambulatory Visit: Payer: PPO | Admitting: Podiatry

## 2021-11-04 ENCOUNTER — Telehealth: Payer: PPO

## 2021-11-19 ENCOUNTER — Encounter: Payer: Self-pay | Admitting: Internal Medicine

## 2021-11-19 ENCOUNTER — Ambulatory Visit (INDEPENDENT_AMBULATORY_CARE_PROVIDER_SITE_OTHER): Payer: PPO | Admitting: Internal Medicine

## 2021-11-19 VITALS — BP 130/70 | HR 78 | Temp 98.6°F | Ht 65.0 in | Wt 151.0 lb

## 2021-11-19 DIAGNOSIS — N182 Chronic kidney disease, stage 2 (mild): Secondary | ICD-10-CM

## 2021-11-19 DIAGNOSIS — E1122 Type 2 diabetes mellitus with diabetic chronic kidney disease: Secondary | ICD-10-CM

## 2021-11-19 DIAGNOSIS — Z6825 Body mass index (BMI) 25.0-25.9, adult: Secondary | ICD-10-CM

## 2021-11-19 DIAGNOSIS — I700 Atherosclerosis of aorta: Secondary | ICD-10-CM | POA: Diagnosis not present

## 2021-11-19 DIAGNOSIS — E673 Hypervitaminosis D: Secondary | ICD-10-CM

## 2021-11-19 DIAGNOSIS — E890 Postprocedural hypothyroidism: Secondary | ICD-10-CM | POA: Diagnosis not present

## 2021-11-19 DIAGNOSIS — I129 Hypertensive chronic kidney disease with stage 1 through stage 4 chronic kidney disease, or unspecified chronic kidney disease: Secondary | ICD-10-CM | POA: Diagnosis not present

## 2021-11-19 DIAGNOSIS — F411 Generalized anxiety disorder: Secondary | ICD-10-CM

## 2021-11-19 MED ORDER — SERTRALINE HCL 25 MG PO TABS: 25.0000 mg | ORAL_TABLET | Freq: Every day | ORAL | 2 refills | Status: DC

## 2021-11-19 NOTE — Patient Instructions (Addendum)
Generalized Anxiety Disorder, Adult ?Generalized anxiety disorder (GAD) is a mental health condition. Unlike normal worries, anxiety related to GAD is not triggered by a specific event. These worries do not fade or get better with time. GAD interferes with relationships, work, and school. ?GAD symptoms can vary from mild to severe. People with severe GAD can have intense waves of anxiety with physical symptoms that are similar to panic attacks. ?What are the causes? ?The exact cause of GAD is not known, but the following are believed to have an impact: ?Differences in natural brain chemicals. ?Genes passed down from parents to children. ?Differences in the way threats are perceived. ?Development and stress during childhood. ?Personality. ?What increases the risk? ?The following factors may make you more likely to develop this condition: ?Being female. ?Having a family history of anxiety disorders. ?Being very shy. ?Experiencing very stressful life events, such as the death of a loved one. ?Having a very stressful family environment. ?What are the signs or symptoms? ?People with GAD often worry excessively about many things in their lives, such as their health and family. Symptoms may also include: ?Mental and emotional symptoms: ?Worrying excessively about natural disasters. ?Fear of being late. ?Difficulty concentrating. ?Fears that others are judging your performance. ?Physical symptoms: ?Fatigue. ?Headaches, muscle tension, muscle twitches, trembling, or feeling shaky. ?Feeling like your heart is pounding or beating very fast. ?Feeling out of breath or like you cannot take a deep breath. ?Having trouble falling asleep or staying asleep, or experiencing restlessness. ?Sweating. ?Nausea, diarrhea, or irritable bowel syndrome (IBS). ?Behavioral symptoms: ?Experiencing erratic moods or irritability. ?Avoidance of new situations. ?Avoidance of people. ?Extreme difficulty making decisions. ?How is this diagnosed? ?This  condition is diagnosed based on your symptoms and medical history. You will also have a physical exam. Your health care provider may perform tests to rule out other possible causes of your symptoms. ?To be diagnosed with GAD, a person must have anxiety that: ?Is out of his or her control. ?Affects several different aspects of his or her life, such as work and relationships. ?Causes distress that makes him or her unable to take part in normal activities. ?Includes at least three symptoms of GAD, such as restlessness, fatigue, trouble concentrating, irritability, muscle tension, or sleep problems. ?Before your health care provider can confirm a diagnosis of GAD, these symptoms must be present more days than they are not, and they must last for 6 months or longer. ?How is this treated? ?This condition may be treated with: ?Medicine. Antidepressant medicine is usually prescribed for long-term daily control. Anti-anxiety medicines may be added in severe cases, especially when panic attacks occur. ?Talk therapy (psychotherapy). Certain types of talk therapy can be helpful in treating GAD by providing support, education, and guidance. Options include: ?Cognitive behavioral therapy (CBT). People learn coping skills and self-calming techniques to ease their physical symptoms. They learn to identify unrealistic thoughts and behaviors and to replace them with more appropriate thoughts and behaviors. ?Acceptance and commitment therapy (ACT). This treatment teaches people how to be mindful as a way to cope with unwanted thoughts and feelings. ?Biofeedback. This process trains you to manage your body's response (physiological response) through breathing techniques and relaxation methods. You will work with a therapist while machines are used to monitor your physical symptoms. ?Stress management techniques. These include yoga, meditation, and exercise. ?A mental health specialist can help determine which treatment is best for you.  Some people see improvement with one type of therapy. However, other people require   a combination of therapies. Follow these instructions at home: Lifestyle Maintain a consistent routine and schedule. Anticipate stressful situations. Create a plan and allow extra time to work with your plan. Practice stress management or self-calming techniques that you have learned from your therapist or your health care provider. Exercise regularly and spend time outdoors. Eat a healthy diet that includes plenty of vegetables, fruits, whole grains, low-fat dairy products, and lean protein. Do not eat a lot of foods that are high in fat, added sugar, or salt (sodium). Drink plenty of water. Avoid alcohol. Alcohol can increase anxiety. Avoid caffeine and certain over-the-counter cold medicines. These may make you feel worse. Ask your pharmacist which medicines to avoid. General instructions Take over-the-counter and prescription medicines only as told by your health care provider. Understand that you are likely to have setbacks. Accept this and be kind to yourself as you persist to take better care of yourself. Anticipate stressful situations. Create a plan and allow extra time to work with your plan. Recognize and accept your accomplishments, even if you judge them as small. Spend time with people who care about you. Keep all follow-up visits. This is important. Where to find more information [place]: https://carter.com/ Substance Abuse and Mental Health Services: ktimeonline.com Contact a health care provider if: Your symptoms do not get better. Your symptoms get worse. You have signs of depression, such as: A persistently sad or irritable mood. Loss of enjoyment in activities that used to bring you joy. Change in weight or eating. Changes in sleeping habits. Get help right away if: You have thoughts about hurting yourself or others. If you ever feel like you may hurt  yourself or others, or have thoughts about taking your own life, get help right away. Go to your nearest emergency department or: Call your local emergency services (911 in the [place] Call a suicide crisis helpline, such as the [facility] at 831-186-5911 or 988 in the [place]. This is open 24 hours a day in the [place] Text the Crisis Text Line at 313 528 3331 (in the [place] Summary Generalized anxiety disorder (GAD) is a mental health condition that involves worry that is not triggered by a specific event. People with GAD often worry excessively about many things in their lives, such as their health and family. GAD may cause symptoms such as restlessness, trouble concentrating, sleep problems, frequent sweating, nausea, diarrhea, headaches, and trembling or muscle twitching. A mental health specialist can help determine which treatment is best for you. Some people see improvement with one type of therapy. However, other people require a combination of therapies. This information is not intended to replace advice given to you by your health care provider. Make sure you discuss any questions you have with your health care provider. Document Revised: 12/04/2020 Document Reviewed: 09/01/2020 Elsevier Patient Education  [place].   Diabetes Mellitus and Nutrition, Adult When you have diabetes, or diabetes mellitus, it is very important to have healthy eating habits because your blood sugar (glucose) levels are greatly affected by what you eat and drink. Eating healthy foods in the right amounts, at about the same times every day, can help you: Manage your blood glucose. Lower your risk of heart disease. Improve your blood pressure. Reach or maintain a healthy weight. What can affect my meal plan? Every person with diabetes is different, and each person has different needs for a meal plan. Your health care provider may recommend that you work with a  dietitian to make a meal  plan that is best for you. Your meal plan may vary depending on factors such as: The calories you need. The medicines you take. Your weight. Your blood glucose, blood pressure, and cholesterol levels. Your activity level. Other health conditions you have, such as heart or kidney disease. How do carbohydrates affect me? Carbohydrates, also called carbs, affect your blood glucose level more than any other type of food. Eating carbs raises the amount of glucose in your blood. It is important to know how many carbs you can safely have in each meal. This is different for every person. Your dietitian can help you calculate how many carbs you should have at each meal and for each snack. How does alcohol affect me? Alcohol can cause a decrease in blood glucose (hypoglycemia), especially if you use insulin or take certain diabetes medicines by mouth. Hypoglycemia can be a life-threatening condition. Symptoms of hypoglycemia, such as sleepiness, dizziness, and confusion, are similar to symptoms of having too much alcohol. Do not drink alcohol if: Your health care provider tells you not to drink. You are pregnant, may be pregnant, or are planning to become pregnant. If you drink alcohol: Limit how much you have to: 0-1 drink a day for women. 0-2 drinks a day for men. Know how much alcohol is in your drink. In the [place] one drink equals one 12 oz bottle of beer (355 mL), one 5 oz glass of wine (148 mL), or one 1 oz glass of hard liquor (44 mL). Keep yourself hydrated with water, diet soda, or unsweetened iced tea. Keep in mind that regular soda, juice, and other mixers may contain a lot of sugar and must be counted as carbs. What are tips for following this plan?  Reading food labels Start by checking the serving size on the Nutrition Facts label of packaged foods and drinks. The number of calories and the amount of carbs, fats, and other nutrients listed on the label are based on one serving of the  item. Many items contain more than one serving per package. Check the total grams (g) of carbs in one serving. Check the number of grams of saturated fats and trans fats in one serving. Choose foods that have a low amount or none of these fats. Check the number of milligrams (mg) of salt (sodium) in one serving. Most people should limit total sodium intake to less than 2,300 mg per day. Always check the nutrition information of foods labeled as "low-fat" or "nonfat." These foods may be higher in added sugar or refined carbs and should be avoided. Talk to your dietitian to identify your daily goals for nutrients listed on the label. Shopping Avoid buying canned, pre-made, or processed foods. These foods tend to be high in fat, sodium, and added sugar. Shop around the outside edge of the grocery store. This is where you will most often find fresh fruits and vegetables, bulk grains, fresh meats, and fresh dairy products. Cooking Use low-heat cooking methods, such as baking, instead of high-heat cooking methods, such as deep frying. Cook using healthy oils, such as olive, canola, or sunflower oil. Avoid cooking with butter, cream, or high-fat meats. Meal planning Eat meals and snacks regularly, preferably at the same times every day. Avoid going long periods of time without eating. Eat foods that are high in fiber, such as fresh fruits, vegetables, beans, and whole grains. Eat 4-6 oz (112-168 g) of lean protein each day, such as lean meat, chicken,  fish, eggs, or tofu. One ounce (oz) (28 g) of lean protein is equal to: 1 oz (28 g) of meat, chicken, or fish. 1 egg.  cup (62 g) of tofu. Eat some foods each day that contain healthy fats, such as avocado, nuts, seeds, and fish. What foods should I eat? Fruits Berries. Apples. Oranges. Peaches. Apricots. Plums. Grapes. Mangoes. Papayas. Pomegranates. Kiwi. Cherries. Vegetables Leafy greens, including lettuce, spinach, kale, chard, collard greens,  mustard greens, and cabbage. Beets. Cauliflower. Broccoli. Carrots. Green beans. Tomatoes. Peppers. Onions. Cucumbers. Brussels sprouts. Grains Whole grains, such as whole-wheat or whole-grain bread, crackers, tortillas, cereal, and pasta. Unsweetened oatmeal. Quinoa. Brown or wild rice. Meats and other proteins Seafood. Poultry without skin. Lean cuts of poultry and beef. Tofu. Nuts. Seeds. Dairy Low-fat or fat-free dairy products such as milk, yogurt, and cheese. The items listed above may not be a complete list of foods and beverages you can eat and drink. Contact a dietitian for more information. What foods should I avoid? Fruits Fruits canned with syrup. Vegetables Canned vegetables. Frozen vegetables with butter or cream sauce. Grains Refined white flour and flour products such as bread, pasta, snack foods, and cereals. Avoid all processed foods. Meats and other proteins Fatty cuts of meat. Poultry with skin. Breaded or fried meats. Processed meat. Avoid saturated fats. Dairy Full-fat yogurt, cheese, or milk. Beverages Sweetened drinks, such as soda or iced tea. The items listed above may not be a complete list of foods and beverages you should avoid. Contact a dietitian for more information. Questions to ask a health care provider Do I need to meet with a certified diabetes care and education specialist? Do I need to meet with a dietitian? What number can I call if I have questions? When are the best times to check my blood glucose? Where to find more information: American Diabetes Association: diabetes.org Academy of Nutrition and Dietetics: eatright.[employer] of Diabetes and Digestive and Kidney Diseases: AmenCredit.is Association of Diabetes Care & Education Specialists: diabeteseducator.org Summary It is important to have healthy eating habits because your blood sugar (glucose) levels are greatly affected by what you eat and drink. It is important to use  alcohol carefully. A healthy meal plan will help you manage your blood glucose and lower your risk of heart disease. Your health care provider may recommend that you work with a dietitian to make a meal plan that is best for you. This information is not intended to replace advice given to you by your health care provider. Make sure you discuss any questions you have with your health care provider. Document Revised: 12/13/2019 Document Reviewed: 12/13/2019 Elsevier Patient Education  [place].

## 2021-11-19 NOTE — Progress Notes (Signed)
I,Jameka J Llittleton,acting as a scribe for Robyn N Sanders, MD.,have documented all relevant documentation on the behalf of Robyn N Sanders, MD,as directed by  Robyn N Sanders, MD while in the presence of Robyn N Sanders, MD.  This visit occurred during the SARS-CoV-2 public health emergency.  Safety protocols were in place, including screening questions prior to the visit, additional usage of staff PPE, and extensive cleaning of exam room while observing appropriate contact time as indicated for disinfecting solutions.  Subjective:     Patient ID: Brooke Brown , female    DOB: 02/27/1951 , 70 y.o.   MRN: [id number]   Chief Complaint  Patient presents with   Diabetes   Hypertension    HPI  She presents today for diabetes, blood pressure check. She reports compliance with meds.  She denies headaches, chest pain and shortness of breath. She is concerned about her home BP readings, she has had five readings in the past month above 140/90. She admits she is not able to walk regularly due to leg pain. She is now followed by Ortho, Dr. Persons.   Diabetes She presents for her follow-up diabetic visit. She has type 2 diabetes mellitus. Her disease course has been stable. There are no hypoglycemic associated symptoms. Pertinent negatives for diabetes include no blurred vision, no polydipsia, no polyphagia and no polyuria. There are no hypoglycemic complications. Diabetic complications include nephropathy. Risk factors for coronary artery disease include diabetes mellitus, dyslipidemia, hypertension, post-menopausal and sedentary lifestyle. Her weight is stable. She is following a diabetic diet. She participates in exercise three times a week. Her breakfast blood glucose is taken between 8-9 am. Her breakfast blood glucose range is generally 110-130 mg/dl. Eye exam is current.  Hypertension This is a chronic problem. The current episode started more than 1 year ago. The problem has been  gradually improving since onset. The problem is controlled. Pertinent negatives include no blurred vision, neck pain or palpitations.     Past Medical History:  Diagnosis Date   Anxiety    Arthritis    Diabetes mellitus    Grave's disease    Grave's disease    Hyperlipemia    Hypertension      Family History  Problem Relation Age of Onset   Pancreatic cancer Mother    Heart failure Father    Diabetes Brother    Breast cancer Cousin      Current Outpatient Medications:    acetaminophen (TYLENOL) 500 MG tablet, Take 500 mg by mouth daily as needed for moderate pain or headache., Disp: , Rfl:    amLODipine (NORVASC) 5 MG tablet, Take 1 tablet by mouth once daily, Disp: 90 tablet, Rfl: 0   aspirin EC 81 MG tablet, Take 81 mg by mouth daily., Disp: , Rfl:    atorvastatin (LIPITOR) 20 MG tablet, TAKE 1 TABLET BY MOUTH 3 TIMES A WEEK, Disp: 36 tablet, Rfl: 0   Blood Glucose Monitoring Suppl (ONE TOUCH ULTRA 2) w/Device KIT, Use to check blood sugar 3 times a day. Dx code e11.65, Disp: 1 kit, Rfl: 2   Carboxymethylcellul-Glycerin (LUBRICATING EYE DROPS OP), Place 1 drop into both eyes 2 (two) times daily., Disp: , Rfl:    hydrochlorothiazide (HYDRODIURIL) 25 MG tablet, Take 1 tablet by mouth once daily, Disp: 90 tablet, Rfl: 2   Magnesium 250 MG TABS, otc, Disp:  , Rfl: 0   meloxicam (MOBIC) 7.5 MG tablet, Take 1 tablet (7.5 mg total) by mouth daily., Disp: 20   tablet, Rfl: 0   metFORMIN (GLUCOPHAGE-XR) 500 MG 24 hr tablet, Take 1 tablet by mouth twice daily, Disp: 180 tablet, Rfl: 0   olmesartan (BENICAR) 40 MG tablet, Take 1 tablet by mouth once daily, Disp: 90 tablet, Rfl: 0   OneTouch Delica Lancets 30G MISC, USE AS DIRECTED TO CHECK BLOOD SUGARS ONCE DAILY, Disp: 100 each, Rfl: 2   ONETOUCH ULTRA test strip, USE AS INSTRUCTED TO CHECK BLOOD SUGARS ONE TIME PER DAY, Disp: 100 each, Rfl: 2   sertraline (ZOLOFT) 25 MG tablet, Take 1 tablet (25 mg total) by mouth daily., Disp: 30 tablet,  Rfl: 2   SYNTHROID 112 MCG tablet, TAKE 1 TABLET BY MOUTH ONCE DAILY MONDAY-SATURDAY  AND  ONE  HALF  TABLET  ON  SUNDAY, Disp: 90 tablet, Rfl: 1   No Known Allergies   Review of Systems  Constitutional: Negative.   Eyes:  Negative for blurred vision.  Respiratory: Negative.    Cardiovascular: Negative.  Negative for palpitations.  Gastrointestinal: Negative.   Endocrine: Negative for polydipsia, polyphagia and polyuria.  Musculoskeletal:  Negative for neck pain.  Neurological: Negative.   Psychiatric/Behavioral: Negative.       Today's Vitals   11/19/21 1125  BP: 130/70  Pulse: 78  Temp: 98.6 F (37 C)  Weight: 151 lb (68.5 kg)  Height: 5' 5" (1.651 m)  PainSc: 0-No pain   Body mass index is 25.13 kg/m.  Wt Readings from Last 3 Encounters:  11/19/21 151 lb (68.5 kg)  09/17/21 153 lb (69.4 kg)  08/26/21 153 lb (69.4 kg)     Objective:  Physical Exam Vitals and nursing note reviewed.  Constitutional:      Appearance: Normal appearance.  HENT:     Head: Normocephalic and atraumatic.  Eyes:     Extraocular Movements: Extraocular movements intact.  Cardiovascular:     Rate and Rhythm: Normal rate and regular rhythm.     Heart sounds: Normal heart sounds.  Pulmonary:     Effort: Pulmonary effort is normal.     Breath sounds: Normal breath sounds.  Musculoskeletal:     Cervical back: Normal range of motion.  Skin:    General: Skin is warm.  Neurological:     General: No focal deficit present.     Mental Status: She is alert.  Psychiatric:        Mood and Affect: Mood normal.        Behavior: Behavior normal.      Assessment And Plan:     1. Type 2 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 disease, without long-term current use of insulin (HCC) Comments: Chronic, I will check labs as below. She will rto in Oct 2023 for her next physical exam.  - Hemoglobin A1c - CBC no Diff - Lipid panel  2. Benign hypertensive renal disease Comments: Chronic,  controlled. Home BP readings reviewed. I did check her BP w/ her home machine, BP was markedly elevated 168/89. I will not make any BP med changes today. She will c/w olmesartan, hctz, amlodipine. Reminded to limit her intake of processed meats including bacon, sausages and deli meats.  - BMP8+EGFR  3. Generalized anxiety disorder Comments: Chronic, GAD-7 screening questions reviewed. She agrees to starting sertraline, 25mg daily. Possible side effects discussed. She is advised to take in the evenings. She will f/u in 6 weeks.   4. Atherosclerosis of aorta (HCC) Comments: Chronic, she is on statin therapy. LDL goal <70. She will c/w ASA   therapy as well.  - Lipid panel  5. Postablative hypothyroidism Comments: I will check thyroid panel and adjust meds as needed.  - TSH + free T4  6. High vitamin D level Comments: Her last vitamin D level was 128 in December 2022.  I will recheck vitamin D level today.  - Vitamin D (25 hydroxy)  7. BMI 25.0-25.9,adult Comments: She is encouraged to aim for at least 150 minutes of exercise/week. Advised to consider water exercises due to joint issues.    Patient was given opportunity to ask questions. Patient verbalized understanding of the plan and was able to repeat key elements of the plan. All questions were answered to their satisfaction.   I, [name], MD, have reviewed all documentation for this visit. The documentation on 11/19/21 for the exam, diagnosis, procedures, and orders are all accurate and complete.   IF YOU HAVE BEEN REFERRED TO A SPECIALIST, IT MAY TAKE 1-2 WEEKS TO SCHEDULE/PROCESS THE REFERRAL. IF YOU HAVE NOT HEARD FROM US/SPECIALIST IN TWO WEEKS, PLEASE GIVE [place] A CALL AT [phone].   THE PATIENT IS ENCOURAGED TO PRACTICE SOCIAL DISTANCING DUE TO THE COVID-19 PANDEMIC.

## 2021-11-20 ENCOUNTER — Encounter: Payer: Self-pay | Admitting: Internal Medicine

## 2021-11-20 LAB — CBC
Hematocrit: 38.3 % (ref 34.0–46.6)
Hemoglobin: 12.8 g/dL (ref 11.1–15.9)
MCH: 28.3 pg (ref 26.6–33.0)
MCHC: 33.4 g/dL (ref 31.5–35.7)
MCV: 85 fL (ref 79–97)
Platelets: 394 10*3/uL (ref 150–450)
RBC: 4.53 x10E6/uL (ref 3.77–5.28)
RDW: 13.1 % (ref 11.7–15.4)
WBC: 5.1 10*3/uL (ref 3.4–10.8)

## 2021-11-20 LAB — LIPID PANEL
Chol/HDL Ratio: 3.3 ratio (ref 0.0–4.4)
Cholesterol, Total: 180 mg/dL (ref 100–199)
HDL: 54 mg/dL (ref >39)
LDL Chol Calc (NIH): 108 mg/dL — ABNORMAL HIGH (ref 0–99)
Triglycerides: 99 mg/dL (ref 0–149)
VLDL Cholesterol Cal: 18 mg/dL (ref 5–40)

## 2021-11-20 LAB — BMP8+EGFR
BUN/Creatinine Ratio: 17 (ref 12–28)
BUN: 14 mg/dL (ref 8–27)
CO2: 25 mmol/L (ref 20–29)
Calcium: 9.5 mg/dL (ref 8.7–10.3)
Chloride: 103 mmol/L (ref 96–106)
Creatinine, Ser: 0.81 mg/dL (ref 0.57–1.00)
Glucose: 104 mg/dL — ABNORMAL HIGH (ref 70–99)
Potassium: 4.4 mmol/L (ref 3.5–5.2)
Sodium: 141 mmol/L (ref 134–144)
eGFR: 77 mL/min/{1.73_m2} (ref >59)

## 2021-11-20 LAB — TSH+FREE T4
Free T4: 1.55 ng/dL (ref 0.82–1.77)
TSH: 1.17 u[IU]/mL (ref 0.450–4.500)

## 2021-11-20 LAB — HEMOGLOBIN A1C
Est. average glucose Bld gHb Est-mCnc: 137 mg/dL
Hgb A1c MFr Bld: 6.4 % — ABNORMAL HIGH (ref 4.8–5.6)

## 2021-11-20 LAB — VITAMIN D 25 HYDROXY (VIT D DEFICIENCY, FRACTURES): Vit D, 25-Hydroxy: 75.5 ng/mL (ref 30.0–100.0)

## 2021-11-21 ENCOUNTER — Encounter: Payer: Self-pay | Admitting: Physician Assistant

## 2021-11-21 ENCOUNTER — Telehealth: Payer: PPO

## 2021-11-21 ENCOUNTER — Other Ambulatory Visit: Payer: Self-pay | Admitting: Internal Medicine

## 2021-11-21 ENCOUNTER — Ambulatory Visit: Payer: PPO | Admitting: Physician Assistant

## 2021-11-21 DIAGNOSIS — M25561 Pain in right knee: Secondary | ICD-10-CM | POA: Diagnosis not present

## 2021-11-21 NOTE — Progress Notes (Unsigned)
Office Visit Note   Patient: Brooke Brown           Date of Birth: 1951-01-10           MRN: [id number] Visit Date: 11/21/2021              Requested by: Glendale Chard, [address] [place],  [place] [address] PCP: Glendale Chard, MD  Chief Complaint  Patient presents with   Right Knee - Follow-up      HPI: Patient presents in follow-up for her right knee.  She has had trouble with her lateral right knee since [place] the [place] over 3 months ago.  She has tried conservative treatment including anti-inflammatories topical Voltaren gel and even a steroid injection.  These have not helped her.  Her biggest problem is of mechanical symptoms where she can step on her knee feel allowed painful pop.  She normally likes to to walk to keep fit and this has been very limiting to her  Assessment & Plan: Visit Diagnoses:  1. Acute pain of right knee     Plan: She has failed 3 months of conservative treatment including anti-inflammatories and steroid injection and lower extremity strengthening.  Mechanical symptoms are consistent with a possible lateral meniscus tear or medial meniscus tear.  Recommend an MRI with follow-up with Dr. Durward Fortes once this is completed  Follow-Up Instructions: No follow-ups on file.   Ortho Exam  Patient is alert, oriented, no adenopathy, well-dressed, normal affect, normal respiratory effort. Right knee no redness no effusion.  She does have some mild grinding and pain in range of motion but most of her pain is with palpation of the lateral joint line with terminal extension and flexion  Imaging: No results found. No images are attached to the encounter.  Labs: Lab Results  Component Value Date   HGBA1C 6.4 (H) 11/19/2021   HGBA1C 6.4 (H) 08/11/2021   HGBA1C 6.4 (H) 05/05/2021   ESRSEDRATE 19 08/29/2018   LABURIC 5.0 08/29/2018     Lab Results  Component Value Date   ALBUMIN 4.3 08/11/2021   ALBUMIN 4.5  12/31/2020   ALBUMIN 4.3 08/15/2020    Lab Results  Component Value Date   MG 2.2 03/29/2019   Lab Results  Component Value Date   VD25OH 75.5 11/19/2021   VD25OH 128.0 (H) 05/05/2021   VD25OH 109.0 (H) 12/31/2020    No results found for: "PREALBUMIN"    Latest Ref Rng & Units 11/19/2021   11:57 AM 12/31/2020   10:41 AM 12/26/2019    4:42 PM  CBC EXTENDED  WBC 3.4 - 10.8 x10E3/uL 5.1  5.5  5.4   RBC 3.77 - 5.28 x10E6/uL 4.53  4.63  4.48   Hemoglobin 11.1 - 15.9 g/dL 12.8  13.0  12.6   HCT 34.0 - 46.6 % 38.3  39.6  36.9   Platelets 150 - 450 x10E3/uL 394  344  360      There is no height or weight on file to calculate BMI.  Orders:  Orders Placed This Encounter  Procedures   MR Knee Right w/o contrast   No orders of the defined types were placed in this encounter.    Procedures: No procedures performed  Clinical Data: No additional findings.  ROS:  All other systems negative, except as noted in the HPI. Review of Systems  Objective: Vital Signs: There were no vitals taken for this visit.  Specialty Comments:  No specialty  comments available.  PMFS History: Patient Active Problem List   Diagnosis Date Noted   Generalized anxiety disorder 11/19/2021   High vitamin D level 11/19/2021   Pain in right knee 09/19/2021   Colon cancer screening 07/30/2021   Flatulence, eructation and gas pain 07/30/2021   Gastroesophageal reflux disease 07/30/2021   History of colonic polyps 07/30/2021   Right lower quadrant pain 07/30/2021   Constipation 07/30/2021   Osteoarthritis of carpometacarpal (CMC) joint of thumb 03/04/2021   Bilateral hand pain 03/04/2021   Atherosclerosis of aorta ([place]) 08/15/2020   Anosmia 02/27/2020   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 02/27/2020   Hypothyroidism 02/27/2020   Other long term (current) drug therapy 02/27/2020   BMI 25.0-25.9,adult 12/26/2019   Vitamin D deficiency disease 12/26/2019   Pure  hypercholesterolemia 03/29/2019   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   Overweight (BMI 25.0-29.9) 03/29/2019   Right wrist pain 09/30/2015   Unspecified essential hypertension 12/20/2013   Other and unspecified hyperlipidemia 12/20/2013   Type 2 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 disease, without long-term current use of insulin ([place]) 12/20/2013   Neuralgia 12/20/2013   Neck pain 12/20/2013   Dizziness 12/20/2013   Past Medical History:  Diagnosis Date   Anxiety    Arthritis    Diabetes mellitus    Grave's disease    Grave's disease    Hyperlipemia    Hypertension     Family History  Problem Relation Age of Onset   Pancreatic cancer Mother    Heart failure Father    Diabetes Brother    Breast cancer Cousin     Past Surgical History:  Procedure Laterality Date   ABDOMINAL HYSTERECTOMY     CATARACT EXTRACTION W/ INTRAOCULAR LENS  IMPLANT, BILATERAL Bilateral    CHOLECYSTECTOMY N/A 08/16/2017   Procedure: LAPAROSCOPIC CHOLECYSTECTOMY;  Surgeon: Judeth Horn, MD;  Location: [facility];  Service: General;  Laterality: N/A;   TUBAL LIGATION     Social History   Occupational History   Occupation: retired  Tobacco Use   Smoking status: Never   Smokeless tobacco: Never  Vaping Use   Vaping Use: Never used  Substance and Sexual Activity   Alcohol use: Not Currently    Alcohol/week: 0.0 standard drinks of alcohol    Comment: rarely   Drug use: No   Sexual activity: Not Currently

## 2021-11-24 ENCOUNTER — Telehealth: Payer: Self-pay | Admitting: *Deleted

## 2021-11-24 NOTE — Chronic Care Management (AMB) (Signed)
  Chronic Care Management Note  11/24/2021 Name: Brooke Brown MRN: [id number] DOB: [birth year]  Brooke Brown is a 71 y.o. year old female who is a primary care patient of Glendale Chard, MD and is actively engaged with the care management team. I reached out to [facility] by phone today to assist with re-scheduling a follow up visit with the RN Case Manager  Follow up plan: Telephone appointment with care management team member scheduled for:12/04/21  [place], [place] [facility]  Direct Dial: [phone]

## 2021-11-24 NOTE — Chronic Care Management (AMB) (Signed)
  Chronic Care Management Note  11/24/2021 Name: Tennille Montelongo MRN: [id number] DOB: May 08, 1951  Waymond Cera Mirante is a 71 y.o. year old female who is a primary care patient of Glendale Chard, MD and is actively engaged with the care management team. I reached out to [facility] by phone today to assist with re-scheduling a follow up visit with the RN Case Manager  Follow up plan: Unsuccessful telephone outreach attempt made. A HIPAA compliant phone message was left for the patient providing contact information and requesting a return call.  The care management team will reach out to the patient again over the next 1-2 days.  If patient returns call to provider office, please advise to call [place] at [phone].Marland Kitchen  [facility]  Direct Dial: [phone]

## 2021-11-27 ENCOUNTER — Telehealth: Payer: Self-pay | Admitting: Orthopaedic Surgery

## 2021-11-27 NOTE — Telephone Encounter (Signed)
Called pt 1X for pt to call and set an MRI Review appt with Dr. Durward Fortes after 12/03/21

## 2021-11-28 ENCOUNTER — Telehealth: Payer: PPO

## 2021-12-03 ENCOUNTER — Ambulatory Visit
Admission: RE | Admit: 2021-12-03 | Discharge: 2021-12-03 | Disposition: A | Discharge status: Home / Self Care | Payer: PPO | Source: Ambulatory Visit | Attending: Physician Assistant | Admitting: Physician Assistant

## 2021-12-03 DIAGNOSIS — M25561 Pain in right knee: Secondary | ICD-10-CM | POA: Diagnosis not present

## 2021-12-04 ENCOUNTER — Telehealth: Payer: PPO

## 2021-12-09 ENCOUNTER — Ambulatory Visit (INDEPENDENT_AMBULATORY_CARE_PROVIDER_SITE_OTHER): Payer: PPO | Admitting: Orthopaedic Surgery

## 2021-12-09 ENCOUNTER — Encounter: Payer: Self-pay | Admitting: Orthopaedic Surgery

## 2021-12-09 DIAGNOSIS — M1711 Unilateral primary osteoarthritis, right knee: Secondary | ICD-10-CM | POA: Diagnosis not present

## 2021-12-09 NOTE — Progress Notes (Signed)
Office Visit Note   Patient: Brooke Brown           Date of Birth: 24-Oct-1950           MRN: [id number] Visit Date: 12/09/2021              Requested by: Glendale Chard, [address] [place],  [place] [address] PCP: Glendale Chard, MD   Assessment & Plan: Visit Diagnoses:  1. Unilateral primary osteoarthritis, right knee     Plan: Is a pleasant 71 year old woman with a 73-monthhistory of right knee pain.  She has failed conservative treatment including a cortisone shot which helped her but did not last very long.  She is also using a soft brace.  Because she did not improve she went forward with an MRI.  She is here to review that today.  MRI showed full-thickness cartilage loss at the lateral femoral-tibial compartment with severe subchondral marrow edema.  Some maceration of the lateral meniscus.  And high-grade partial thickness cartilage loss of the lateral patellofemoral compartment.  Medial compartment was overall preserved.  The majority of her pain is along the lateral compartment.  We recommended continued bracing.  She enjoys walking but is concerned about having pain and falling.  We recommend she obtain a walking stick.  We discussed viscosupplementation with her and she would like to try this. This patient is diagnosed with osteoarthritis of the knee(s).    Radiographs show evidence of joint space narrowing, osteophytes, subchondral sclerosis and/or subchondral cysts.  This patient has knee pain which interferes with functional and activities of daily living.    This patient has experienced inadequate response, adverse effects and/or intolerance with conservative treatments such as acetaminophen, NSAIDS, topical creams, physical therapy or regular exercise, knee bracing and/or weight loss.   This patient has experienced inadequate response or has a contraindication to intra articular steroid injections for at least 3 months.   This patient is not  scheduled to have a total knee replacement within 6 months of starting treatment with viscosupplementation.   Follow-Up Instructions: Return if symptoms worsen or fail to improve.   Orders:  No orders of the defined types were placed in this encounter.  No orders of the defined types were placed in this encounter.     Procedures: No procedures performed   Clinical Data: No additional findings.   Subjective: Chief Complaint  Patient presents with   Right Knee - Follow-up   Patient presents today for MRI review of her right knee. Patient states that she is continuing to have sharp, aching, and throbbing pains. OTC tylenol extra strength is being used prn for pains. Knee sleeve is being worn on the right side to help with comfort.   Review of Systems  All other systems reviewed and are negative.    Objective: Vital Signs: There were no vitals taken for this visit.  Physical Exam Constitutional:      Appearance: Normal appearance.  Pulmonary:     Effort: Pulmonary effort is normal.  Skin:    General: Skin is warm and dry.  Neurological:     General: No focal deficit present.     Mental Status: She is alert.     Ortho Exam Examination of her right knee.  She does have crepitation with range of motion.  Pain more on the lateral side in the patellofemoral joint.  No tenderness medially.  Good varus valgus stability.  She does have clinically some valgus  alignment.  Small effusion.  Full extension and flexion well beyond 100 degrees Specialty Comments:  No specialty comments available.  Imaging: No results found.   PMFS History: Patient Active Problem List   Diagnosis Date Noted   Unilateral primary osteoarthritis, right knee 12/09/2021   Generalized anxiety disorder 11/19/2021   High vitamin D level 11/19/2021   Pain in right knee 09/19/2021   Colon cancer screening 07/30/2021   Flatulence, eructation and gas pain 07/30/2021   Gastroesophageal reflux disease  07/30/2021   History of colonic polyps 07/30/2021   Right lower quadrant pain 07/30/2021   Constipation 07/30/2021   Osteoarthritis of carpometacarpal (CMC) joint of thumb 03/04/2021   Bilateral hand pain 03/04/2021   Atherosclerosis of aorta ([place]) 08/15/2020   Anosmia 02/27/2020   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 02/27/2020   Hypothyroidism 02/27/2020   Other long term (current) drug therapy 02/27/2020   BMI 25.0-25.9,adult 12/26/2019   Vitamin D deficiency disease 12/26/2019   Pure hypercholesterolemia 03/29/2019   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   Overweight (BMI 25.0-29.9) 03/29/2019   Right wrist pain 09/30/2015   Unspecified essential hypertension 12/20/2013   Other and unspecified hyperlipidemia 12/20/2013   Type 2 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 disease, without long-term current use of insulin ([place]) 12/20/2013   Neuralgia 12/20/2013   Neck pain 12/20/2013   Dizziness 12/20/2013   Past Medical History:  Diagnosis Date   Anxiety    Arthritis    Diabetes mellitus    Grave's disease    Grave's disease    Hyperlipemia    Hypertension     Family History  Problem Relation Age of Onset   Pancreatic cancer Mother    Heart failure Father    Diabetes Brother    Breast cancer Cousin     Past Surgical History:  Procedure Laterality Date   ABDOMINAL HYSTERECTOMY     CATARACT EXTRACTION W/ INTRAOCULAR LENS  IMPLANT, BILATERAL Bilateral    CHOLECYSTECTOMY N/A 08/16/2017   Procedure: LAPAROSCOPIC CHOLECYSTECTOMY;  Surgeon: Judeth Horn, MD;  Location: [facility];  Service: General;  Laterality: N/A;   TUBAL LIGATION     Social History   Occupational History   Occupation: retired  Tobacco Use   Smoking status: Never   Smokeless tobacco: Never  Vaping Use   Vaping Use: Never used  Substance and Sexual Activity   Alcohol use: Not Currently    Alcohol/week: 0.0 standard drinks of alcohol    Comment: rarely   Drug use: No    Sexual activity: Not Currently

## 2021-12-10 ENCOUNTER — Telehealth: Payer: Self-pay

## 2021-12-10 ENCOUNTER — Other Ambulatory Visit: Payer: Self-pay | Admitting: Internal Medicine

## 2021-12-10 NOTE — Chronic Care Management (AMB) (Signed)
Care Gap(s) Not Met that Need to be Addressed:   Kidney Health Evaluation for Patients With Diabetes    Action Taken: Reviewed chart and patinet is taking metformin 500 mg twice daily.   Follow Up: None needed   [place] Clinical Pharmacist Assistant [phone]

## 2021-12-11 ENCOUNTER — Other Ambulatory Visit: Payer: Self-pay

## 2021-12-12 ENCOUNTER — Telehealth: Payer: PPO

## 2021-12-12 ENCOUNTER — Ambulatory Visit: Payer: Self-pay

## 2021-12-12 ENCOUNTER — Other Ambulatory Visit: Payer: Self-pay

## 2021-12-12 DIAGNOSIS — I129 Hypertensive chronic kidney disease with stage 1 through stage 4 chronic kidney disease, or unspecified chronic kidney disease: Secondary | ICD-10-CM

## 2021-12-12 DIAGNOSIS — E1122 Type 2 diabetes mellitus with diabetic chronic kidney disease: Secondary | ICD-10-CM

## 2021-12-12 DIAGNOSIS — E890 Postprocedural hypothyroidism: Secondary | ICD-10-CM

## 2021-12-12 NOTE — Patient Instructions (Signed)
Visit Information  Thank you for taking time to visit with me today. Please don't hesitate to contact me if I can be of assistance to you before our next scheduled telephone appointment.  Following are the goals we discussed today:  Take all medications as prescribed Attend all scheduled provider appointments Call pharmacy for medication refills 3-7 days in advance of running out of medications Perform all self care activities independently  Perform IADL's (shopping, preparing meals, housekeeping, managing finances) independently Call provider office for new concerns or questions  Work with the social worker to address care coordination needs and will continue to work with the clinical team to address health care and disease management related needs drink 6 to 8 glasses of water each day fill half of plate with vegetables manage portion size learn about high blood pressure take blood pressure log to all doctor appointments call doctor for signs and symptoms of high blood pressure take medications for blood pressure exactly as prescribed report new symptoms to your doctor  If you are experiencing a Mental Health or [place] or need someone to talk to, please call 1-800-273-TALK (toll free, 24 hour hotline)   Patient verbalizes understanding of instructions and care plan provided today and agrees to view in [facility]. Active MyChart status and patient understanding of how to access instructions and care plan via MyChart confirmed with patient.     Barb Merino, RN, BSN, CCM Care Management Coordinator [facility]  Direct Phone: [phone]

## 2021-12-12 NOTE — Chronic Care Management (AMB) (Signed)
Care Management    RN Visit Note  12/12/2021 Name: Brooke Brown MRN: [id number] DOB: 1950-09-20  Subjective: Brooke Brown is a 71 y.o. year old female who is a primary care patient of Glendale Chard, MD. The care management team was consulted for assistance with disease management and care coordination needs.    Engaged with patient by telephone for follow up visit in response to provider referral for case management and/or care coordination services.   Consent to Services:   Ms. Ferrero was given information about Care Management services today including:  Care Management services includes personalized support from designated clinical staff supervised by her physician, including individualized plan of care and coordination with other care providers 24/7 contact phone numbers for assistance for urgent and routine care needs. The patient may stop case management services at any time by phone call to the office staff.  Patient agreed to services and consent obtained.   Assessment: Review of patient past medical history, allergies, medications, health status, including review of consultants reports, laboratory and other test data, was performed as part of comprehensive evaluation and provision of chronic care management services.   SDOH (Social Determinants of Health) assessments and interventions performed:  Yes, no acute changes  Care Plan  No Known Allergies  Outpatient Encounter Medications as of 12/12/2021  Medication Sig   acetaminophen (TYLENOL) 500 MG tablet Take 500 mg by mouth daily as needed for moderate pain or headache.   amLODipine (NORVASC) 5 MG tablet Take 1 tablet by mouth once daily   aspirin EC 81 MG tablet Take 81 mg by mouth daily.   atorvastatin (LIPITOR) 20 MG tablet TAKE 1 TABLET BY MOUTH THREE TIMES A WEEK   Blood Glucose Monitoring Suppl (ONE TOUCH ULTRA 2) w/Device KIT Use to check blood sugar 3 times a day. Dx code e11.65    Carboxymethylcellul-Glycerin (LUBRICATING EYE DROPS OP) Place 1 drop into both eyes 2 (two) times daily.   hydrochlorothiazide (HYDRODIURIL) 25 MG tablet Take 1 tablet by mouth once daily   Magnesium 250 MG TABS otc   meloxicam (MOBIC) 7.5 MG tablet Take 1 tablet (7.5 mg total) by mouth daily.   metFORMIN (GLUCOPHAGE-XR) 500 MG 24 hr tablet Take 1 tablet by mouth twice daily   olmesartan (BENICAR) 40 MG tablet Take 1 tablet by mouth once daily   OneTouch Delica Lancets 02H MISC USE AS DIRECTED TO CHECK BLOOD SUGARS ONCE DAILY   ONETOUCH ULTRA test strip USE AS INSTRUCTED TO CHECK BLOOD SUGARS ONE TIME PER DAY   sertraline (ZOLOFT) 25 MG tablet Take 1 tablet (25 mg total) by mouth daily.   SYNTHROID 112 MCG tablet TAKE 1 TABLET BY MOUTH ONCE DAILY MONDAY-SATURDAY  AND  ONE  HALF  TABLET  ON  SUNDAY   No facility-administered encounter medications on file as of 12/12/2021.    Patient Active Problem List   Diagnosis Date Noted   Unilateral primary osteoarthritis, right knee 12/09/2021   Generalized anxiety disorder 11/19/2021   High vitamin D level 11/19/2021   Pain in right knee 09/19/2021   Colon cancer screening 07/30/2021   Flatulence, eructation and gas pain 07/30/2021   Gastroesophageal reflux disease 07/30/2021   History of colonic polyps 07/30/2021   Right lower quadrant pain 07/30/2021   Constipation 07/30/2021   Osteoarthritis of carpometacarpal (CMC) joint of thumb 03/04/2021   Bilateral hand pain 03/04/2021   Atherosclerosis of aorta ([place]) 08/15/2020   Anosmia 02/27/2020   Glomerular disorders in diseases  classified elsewhere 02/27/2020   Hypothyroidism 02/27/2020   Other long term (current) drug therapy 02/27/2020   BMI 25.0-25.9,adult 12/26/2019   Vitamin D deficiency disease 12/26/2019   Pure hypercholesterolemia 03/29/2019   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   Overweight (BMI 25.0-29.9) 03/29/2019   Right wrist pain 09/30/2015   Unspecified essential  hypertension 12/20/2013   Other and unspecified hyperlipidemia 12/20/2013   Type 2 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 disease, without long-term current use of insulin ([place]) 12/20/2013   Neuralgia 12/20/2013   Neck pain 12/20/2013   Dizziness 12/20/2013    Conditions to be addressed/monitored:  Type 2 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 disease, without long-term current use of insulin, Benign hypertensive renal disease,Postablative hypothyroidism  Care Plan : RN Care Manager Plan of Care  Updates made by Lynne Logan, RN since 12/12/2021 12:00 AM  Completed 12/12/2021   Problem: No plan established with management of chronic disease states (Type 2 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 disease, without long-term current use of insulin, Benign hypertensive renal disease,Postablative hypothyroidism) Resolved 12/12/2021  Priority: High     Long-Range Goal: Establishment of plan of care for management of chronic disease states (Type 2 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 disease, without long-term current use of insulin, Benign hypertensive renal disease,Postablative hypothyroidism) Completed 12/12/2021  Start Date: 05/28/2021  Expected End Date: 05/28/2022  Recent Progress: On track  Priority: High  Note:   Current Barriers:  Knowledge Deficits related to plan of care for management of Type 2 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 disease, without long-term current use of insulin, Benign hypertensive renal disease,Postablative hypothyroidism  Chronic Disease Management support and education needs related to Type 2 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 disease, without long-term current use of insulin, Benign hypertensive renal disease,Postablative hypothyroidism   RNCM Clinical Goal(s):  Patient will verbalize basic understanding of  Type 2 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 disease, without long-term current use of insulin, Benign  hypertensive renal disease,Postablative hypothyroidism disease process and self health management plan as evidenced by patient will report having no disease exacerbations related to her chronic disease states as listed above take all medications exactly as prescribed and will call provider for medication related questions as evidenced by demonstrate improved understanding of prescribed medications and rationale for usage as evidenced by patient teach back demonstrate Ongoing health management independence as evidenced by patient will report 100% adherence to her prescribed treatment plan  continue to work with RN Care Manager to address care management and care coordination needs related to  Type 2 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 disease, without long-term current use of insulin, Benign hypertensive renal disease,Postablative hypothyroidism as evidenced by adherence to CM Team Scheduled appointments demonstrate ongoing self health care management ability   as evidenced by    through collaboration with RN Care manager, provider, and care team.   Interventions: 1:1 collaboration with primary care provider regarding development and update of comprehensive plan of care as evidenced by provider attestation and co-signature Inter-disciplinary care team collaboration (see longitudinal plan of care) Evaluation of current treatment plan related to  self management and patient's adherence to plan as established by provider  Interdisciplinary Collaboration Interventions:  (Status: Goal on track:  Yes.) Long Term Goal   Collaborated with BSW to initiate plan of care to address needs related to Limited education about Advanced Directives* in patient with  Type 2 diabetes mellitus with stage  2 chronic kidney disease, without long-term current use of insulin, Benign hypertensive renal disease,Postablative hypothyroidism Collaboration with Glendale Chard, MD regarding development and update of  comprehensive plan of care as evidenced by provider attestation and co-signature Inter-disciplinary care team collaboration    Diabetes Interventions:  (Status:  Goal Met.) Long Term Goal Assessed patient's understanding of A1c goal: <7% Provided education to patient about basic DM disease process Reviewed medications with patient and discussed importance of medication adherence Counseled on importance of regular laboratory monitoring as prescribed Advised patient, providing education and rationale, to check cbg before meals and at bedtime and record, calling PCP for findings outside established parameters Review of patient status, including review of consultants reports, relevant laboratory and other test results, and medications completed Lab Results  Component Value Date   HGBA1C 6.4 (H) 11/19/2021   Hypertension Interventions:  (Status:  Goal Met.) Long Term Goal Last practice recorded BP readings:  BP Readings from Last 3 Encounters:  11/19/21 130/70  09/17/21 (!) 156/82  09/10/21 137/74  Most recent eGFR/CrCl:  Lab Results  Component Value Date   EGFR 77 11/19/2021    No components found for: "CRCL" Evaluation of current treatment plan related to hypertension self management and patient's adherence to plan as established by provider Counseled on the importance of exercise goals with target of 150 minutes per week Advised patient, providing education and rationale, to monitor blood pressure daily and record, calling PCP for findings outside established parameters Provided education on prescribed diet low Sodium  Educated patient about the PREP program Discussed with patient the importance of limiting eating out and to make good food choices when eating out Provided verbal instructions on how to accurately monitor BP at home  Mailed printed educational materials related to Chair Exercises   Patient Goals/Self-Care Activities: Take all medications as prescribed Attend all  scheduled provider appointments Call pharmacy for medication refills 3-7 days in advance of running out of medications Perform all self care activities independently  Perform IADL's (shopping, preparing meals, housekeeping, managing finances) independently Call provider office for new concerns or questions  Work with the social worker to address care coordination needs and will continue to work with the clinical team to address health care and disease management related needs drink 6 to 8 glasses of water each day fill half of plate with vegetables manage portion size learn about high blood pressure take blood pressure log to all doctor appointments call doctor for signs and symptoms of high blood pressure take medications for blood pressure exactly as prescribed report new symptoms to your doctor  Follow Up Plan:  No further follow up required      Barb Merino, RN, BSN, CCM Care Management Coordinator [facility]  Direct Phone: [phone]

## 2021-12-23 ENCOUNTER — Telehealth: Payer: Self-pay

## 2021-12-23 NOTE — Telephone Encounter (Signed)
VOB submitted for Orthovisc, right knee   

## 2022-01-01 ENCOUNTER — Other Ambulatory Visit: Payer: Self-pay

## 2022-01-01 ENCOUNTER — Telehealth: Payer: Self-pay | Admitting: Physician Assistant

## 2022-01-01 DIAGNOSIS — M1711 Unilateral primary osteoarthritis, right knee: Secondary | ICD-10-CM

## 2022-01-01 NOTE — Telephone Encounter (Signed)
Patient called in stating she is waiting for approval for gel injections and would like to know if it was approved or not

## 2022-01-01 NOTE — Telephone Encounter (Signed)
Talked with patient and appointments have been scheduled for gel injections

## 2022-01-05 ENCOUNTER — Encounter: Payer: PPO | Admitting: Internal Medicine

## 2022-01-06 ENCOUNTER — Other Ambulatory Visit: Payer: Self-pay | Admitting: Internal Medicine

## 2022-01-06 ENCOUNTER — Ambulatory Visit: Payer: PPO | Admitting: Physician Assistant

## 2022-01-07 ENCOUNTER — Ambulatory Visit (INDEPENDENT_AMBULATORY_CARE_PROVIDER_SITE_OTHER): Payer: PPO | Admitting: Physician Assistant

## 2022-01-07 ENCOUNTER — Encounter: Payer: Self-pay | Admitting: Physician Assistant

## 2022-01-07 DIAGNOSIS — M1711 Unilateral primary osteoarthritis, right knee: Secondary | ICD-10-CM

## 2022-01-07 MED ORDER — HYALURONAN 30 MG/2ML IX SOSY
30.0000 mg | PREFILLED_SYRINGE | INTRA_ARTICULAR | Status: AC | PRN
  Administered 2022-01-07: 30 mg via INTRA_ARTICULAR

## 2022-01-07 NOTE — Progress Notes (Signed)
Office Visit Note   Patient: Brooke Brown           Date of Birth: 1951/03/21           MRN: [id number] Visit Date: 01/07/2022              Requested by: Glendale Chard, [address] [place],  [place] [address] PCP: Glendale Chard, MD  Chief Complaint  Patient presents with  . Right Knee - Follow-up      HPI: Patient comes in today for her first Orthovisc injection into her right knee.  No complaints.  Assessment & Plan: Visit Diagnoses:  1. Unilateral primary osteoarthritis, right knee     Plan: Knee was injected without difficulty.  Discussed possible side effects.  She will follow-up in 1 week  Follow-Up Instructions: Return in about 1 week (around 01/14/2022).   Ortho Exam  Patient is alert, oriented, no adenopathy, well-dressed, normal affect, normal respiratory effort. Examination of her knee no redness no erythema no effusion compartments are soft and nontender  Imaging: No results found. No images are attached to the encounter.  Labs: Lab Results  Component Value Date   HGBA1C 6.4 (H) 11/19/2021   HGBA1C 6.4 (H) 08/11/2021   HGBA1C 6.4 (H) 05/05/2021   ESRSEDRATE 19 08/29/2018   LABURIC 5.0 08/29/2018     Lab Results  Component Value Date   ALBUMIN 4.3 08/11/2021   ALBUMIN 4.5 12/31/2020   ALBUMIN 4.3 08/15/2020    Lab Results  Component Value Date   MG 2.2 03/29/2019   Lab Results  Component Value Date   VD25OH 75.5 11/19/2021   VD25OH 128.0 (H) 05/05/2021   VD25OH 109.0 (H) 12/31/2020    No results found for: "PREALBUMIN"    Latest Ref Rng & Units 11/19/2021   11:57 AM 12/31/2020   10:41 AM 12/26/2019    4:42 PM  CBC EXTENDED  WBC 3.4 - 10.8 x10E3/uL 5.1  5.5  5.4   RBC 3.77 - 5.28 x10E6/uL 4.53  4.63  4.48   Hemoglobin 11.1 - 15.9 g/dL 12.8  13.0  12.6   HCT 34.0 - 46.6 % 38.3  39.6  36.9   Platelets 150 - 450 x10E3/uL 394  344  360      There is no height or weight on file to calculate BMI.  Orders:   No orders of the defined types were placed in this encounter.  No orders of the defined types were placed in this encounter.    Procedures: Large Joint Inj: R knee on 01/07/2022 2:03 PM Indications: pain and diagnostic evaluation Details: 25 G 1.5 in needle, anterolateral approach  Arthrogram: No  Medications: 30 mg Hyaluronan 30 MG/2ML Outcome: tolerated well, no immediate complications Procedure, treatment alternatives, risks and benefits explained, specific risks discussed. Consent was given by the patient.    Clinical Data: No additional findings.  ROS:  All other systems negative, except as noted in the HPI. Review of Systems  Objective: Vital Signs: There were no vitals taken for this visit.  Specialty Comments:  No specialty comments available.  PMFS History: Patient Active Problem List   Diagnosis Date Noted  . Unilateral primary osteoarthritis, right knee 12/09/2021  . Generalized anxiety disorder 11/19/2021  . High vitamin D level 11/19/2021  . Pain in right knee 09/19/2021  . Colon cancer screening 07/30/2021  . Flatulence, eructation and gas pain 07/30/2021  . Gastroesophageal reflux disease 07/30/2021  . History of colonic polyps 07/30/2021  .  Right lower quadrant pain 07/30/2021  . Constipation 07/30/2021  . Osteoarthritis of carpometacarpal (CMC) joint of thumb 03/04/2021  . Bilateral hand pain 03/04/2021  . Atherosclerosis of aorta ([place]) 08/15/2020  . 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 Hypothyroidism 02/27/2020  . Other long term (current) drug therapy 02/27/2020  . BMI 25.0-25.9,adult 12/26/2019  . Vitamin D deficiency disease 12/26/2019  . Pure hypercholesterolemia 03/29/2019  . 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 Overweight (BMI 25.0-29.9) 03/29/2019  . Right wrist pain 09/30/2015  . Unspecified essential hypertension 12/20/2013  . Other and unspecified hyperlipidemia  12/20/2013  . Type 2 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 disease, without long-term current use of insulin ([place]) 12/20/2013  . Neuralgia 12/20/2013  . Neck pain 12/20/2013  . Dizziness 12/20/2013   Past Medical History:  Diagnosis Date  . Anxiety   . Arthritis   . Diabetes mellitus   . Grave's disease   . Grave's disease   . Hyperlipemia   . Hypertension     Family History  Problem Relation Age of Onset  . Pancreatic cancer Mother   . Heart failure Father   . Diabetes Brother   . Breast cancer Cousin     Past Surgical History:  Procedure Laterality Date  . ABDOMINAL HYSTERECTOMY    . CATARACT EXTRACTION W/ INTRAOCULAR LENS  IMPLANT, BILATERAL Bilateral   . CHOLECYSTECTOMY N/A 08/16/2017   Procedure: LAPAROSCOPIC CHOLECYSTECTOMY;  Surgeon: Judeth Horn, MD;  Location: [facility];  Service: General;  Laterality: N/A;  . TUBAL LIGATION     Social History   Occupational History  . Occupation: retired  Tobacco Use  . Smoking status: Never  . Smokeless tobacco: Never  Vaping Use  . Vaping Use: Never used  Substance and Sexual Activity  . Alcohol use: Not Currently    Alcohol/week: 0.0 standard drinks of alcohol    Comment: rarely  . Drug use: No  . Sexual activity: Not Currently

## 2022-01-08 ENCOUNTER — Encounter: Payer: Self-pay | Admitting: Internal Medicine

## 2022-01-08 ENCOUNTER — Ambulatory Visit (INDEPENDENT_AMBULATORY_CARE_PROVIDER_SITE_OTHER): Payer: PPO | Admitting: Internal Medicine

## 2022-01-08 VITALS — BP 120/70 | HR 70 | Temp 98.2°F | Ht 65.0 in | Wt 149.4 lb

## 2022-01-08 DIAGNOSIS — H9313 Tinnitus, bilateral: Secondary | ICD-10-CM | POA: Diagnosis not present

## 2022-01-08 DIAGNOSIS — H6121 Impacted cerumen, right ear: Secondary | ICD-10-CM | POA: Diagnosis not present

## 2022-01-08 DIAGNOSIS — F411 Generalized anxiety disorder: Secondary | ICD-10-CM | POA: Diagnosis not present

## 2022-01-08 DIAGNOSIS — Z6825 Body mass index (BMI) 25.0-25.9, adult: Secondary | ICD-10-CM

## 2022-01-08 DIAGNOSIS — E7800 Pure hypercholesterolemia, unspecified: Secondary | ICD-10-CM

## 2022-01-08 DIAGNOSIS — I129 Hypertensive chronic kidney disease with stage 1 through stage 4 chronic kidney disease, or unspecified chronic kidney disease: Secondary | ICD-10-CM | POA: Diagnosis not present

## 2022-01-08 DIAGNOSIS — N182 Chronic kidney disease, stage 2 (mild): Secondary | ICD-10-CM | POA: Diagnosis not present

## 2022-01-08 MED ORDER — ATORVASTATIN CALCIUM 40 MG PO TABS: ORAL_TABLET | ORAL | 3 refills | Status: DC

## 2022-01-08 NOTE — Progress Notes (Signed)
Barnet Glasgow Martin,acting as a Education administrator for [name], MD.,have documented all relevant documentation on the behalf of [name], MD,as directed by  [name], MD while in the presence of [name], MD.    Subjective:     Patient ID: Brooke Brown , female    DOB: July 27, 1950 , 71 y.o.   MRN: [id number]   Chief Complaint  Patient presents with   Diabetes    HPI  Patient presents today for med follow up on sertraline. Patient states she has not had any issues with the medication, but admits she did not start it immediately.    BP Readings from Last 3 Encounters: 01/08/22 : 120/70 11/19/21 : 130/70 09/17/21 : (!) 156/82    Hypertension This is a chronic problem. The current episode started more than 1 year ago. The problem has been gradually improving since onset. The problem is controlled. Pertinent negatives include no neck pain or palpitations.     Past Medical History:  Diagnosis Date   Anxiety    Arthritis    Diabetes mellitus    Grave's disease    Grave's disease    Hyperlipemia    Hypertension      Family History  Problem Relation Age of Onset   Pancreatic cancer Mother    Heart failure Father    Diabetes Brother    Breast cancer Cousin      Current Outpatient Medications:    acetaminophen (TYLENOL) 500 MG tablet, Take 500 mg by mouth daily as needed for moderate pain or headache., Disp: , Rfl:    amLODipine (NORVASC) 5 MG tablet, Take 1 tablet by mouth once daily, Disp: 90 tablet, Rfl: 0   aspirin EC 81 MG tablet, Take 81 mg by mouth daily., Disp: , Rfl:    atorvastatin (LIPITOR) 40 MG tablet, Take one tab MWF, Disp: 36 tablet, Rfl: 3   Blood Glucose Monitoring Suppl (ONE TOUCH ULTRA 2) w/Device KIT, Use to check blood sugar 3 times a day. Dx code e11.65, Disp: 1 kit, Rfl: 2   Carboxymethylcellul-Glycerin (LUBRICATING EYE DROPS OP), Place 1 drop into both eyes 2 (two) times daily., Disp: , Rfl:    hydrochlorothiazide  (HYDRODIURIL) 25 MG tablet, Take 1 tablet by mouth once daily, Disp: 90 tablet, Rfl: 0   Magnesium 250 MG TABS, otc, Disp:  , Rfl: 0   meloxicam (MOBIC) 7.5 MG tablet, Take 1 tablet (7.5 mg total) by mouth daily., Disp: 20 tablet, Rfl: 0   metFORMIN (GLUCOPHAGE-XR) 500 MG 24 hr tablet, Take 1 tablet by mouth twice daily, Disp: 180 tablet, Rfl: 0   olmesartan (BENICAR) 40 MG tablet, Take 1 tablet by mouth once daily, Disp: 90 tablet, Rfl: 0   OneTouch Delica Lancets 86V MISC, USE AS DIRECTED TO CHECK BLOOD SUGARS ONCE DAILY, Disp: 100 each, Rfl: 2   ONETOUCH ULTRA test strip, USE AS INSTRUCTED TO CHECK BLOOD SUGARS ONE TIME PER DAY, Disp: 100 each, Rfl: 2   sertraline (ZOLOFT) 25 MG tablet, Take 1 tablet (25 mg total) by mouth daily., Disp: 30 tablet, Rfl: 2   SYNTHROID 112 MCG tablet, TAKE 1 TABLET MONDAY THROUGH SATURDAY AND 1/2 TABLET ON SUNDAY, Disp: 90 tablet, Rfl: 1   No Known Allergies   Review of Systems  Constitutional: Negative.   HENT:  Positive for tinnitus.        She c/o b/l tinnitus, sx worse at night. She is not sure what may have triggered her  sx. Does not interfere with sleep. Denies recent URI.   Respiratory: Negative.    Cardiovascular: Negative.  Negative for palpitations.  Gastrointestinal: Negative.   Musculoskeletal:  Negative for neck pain.  Neurological: Negative.   Psychiatric/Behavioral: Negative.       Today's Vitals   01/08/22 1201  BP: 120/70  Pulse: 70  Temp: 98.2 F (36.8 C)  TempSrc: Oral  Weight: 149 lb 6.4 oz (67.8 kg)  Height: $Remove'5\' 5"'iYNhreU$  (1.651 m)  PainSc: 0-No pain   Body mass index is 24.86 kg/m.  Wt Readings from Last 3 Encounters:  01/08/22 149 lb 6.4 oz (67.8 kg)  11/19/21 151 lb (68.5 kg)  09/17/21 153 lb (69.4 kg)     Objective:  Physical Exam Vitals and nursing note reviewed.  Constitutional:      Appearance: Normal appearance.  HENT:     Head: Normocephalic and atraumatic.     Right Ear: Ear canal and external ear normal. There  is impacted cerumen.     Left Ear: Tympanic membrane, ear canal and external ear normal.  Eyes:     Extraocular Movements: Extraocular movements intact.  Cardiovascular:     Rate and Rhythm: Normal rate and regular rhythm.     Heart sounds: Normal heart sounds.  Pulmonary:     Effort: Pulmonary effort is normal.     Breath sounds: Normal breath sounds.  Musculoskeletal:     Cervical back: Normal range of motion.  Skin:    General: Skin is warm.  Neurological:     General: No focal deficit present.     Mental Status: She is alert.  Psychiatric:        Mood and Affect: Mood normal.        Behavior: Behavior normal.      Assessment And Plan:     1. Generalized anxiety disorder Comments: Sx have improved slightly, she will c/w sertraline $RemoveBefore'25mg'LARUPVehNqnjX$  daily for now. She will f/u in 6-8 wks.  2. Benign hypertensive renal disease Comments: Chronic, well controlled. NO med changes.  - Amb Referral To Provider Referral Exercise Program (P.R.E.P)  3. Chronic renal disease, stage II Comments: Chronic, reminded to avoid NSAIDs.  - Amb Referral To Provider Referral Exercise Program (P.R.E.P)  4. Tinnitus of both ears Comments: Unfortunately, this has been persistent. I will refer her to ENT for further evaluation.  - Ambulatory referral to ENT  5. Right ear impacted cerumen Comments: After obtaining verbal consent, R eat was flushed by irrigation without any complications. No TM abnormalities were noted.  - Ear Lavage  6. Pure hypercholesterolemia Comments: The dose of atorvastatin was increased to $RemoveBefo'40mg'xrxiMPKkPdf$  dosed MWF at her last visit. I will send new rx and check labs today. LDL goal <70.  - Lipid panel - ALT - Amb Referral To Provider Referral Exercise Program (P.R.E.P)  7. BMI 25.0-25.9,adult Comments: Her BMI is acceptable for her demographic. She is agreeable to PREP referral.      Patient was given opportunity to ask questions. Patient verbalized understanding of the plan and was  able to repeat key elements of the plan. All questions were answered to their satisfaction.  [name], MD   I, [name], MD, have reviewed all documentation for this visit. The documentation on 01/11/22 for the exam, diagnosis, procedures, and orders are all accurate and complete.   IF YOU HAVE BEEN REFERRED TO A SPECIALIST, IT MAY TAKE 1-2 WEEKS TO SCHEDULE/PROCESS THE REFERRAL. IF YOU HAVE NOT HEARD FROM  US/SPECIALIST IN TWO WEEKS, PLEASE GIVE [place] A CALL AT [phone] X [phone].   THE PATIENT IS ENCOURAGED TO PRACTICE SOCIAL DISTANCING DUE TO THE COVID-19 PANDEMIC.

## 2022-01-08 NOTE — Patient Instructions (Signed)

## 2022-01-09 ENCOUNTER — Telehealth: Payer: Self-pay

## 2022-01-09 LAB — LIPID PANEL
Chol/HDL Ratio: 2.9 ratio (ref 0.0–4.4)
Cholesterol, Total: 171 mg/dL (ref 100–199)
HDL: 59 mg/dL (ref >39)
LDL Chol Calc (NIH): 91 mg/dL (ref 0–99)
Triglycerides: 119 mg/dL (ref 0–149)
VLDL Cholesterol Cal: 21 mg/dL (ref 5–40)

## 2022-01-09 LAB — ALT: ALT: 14 IU/L (ref 0–32)

## 2022-01-09 NOTE — Telephone Encounter (Signed)
Call to pt reference PREP referral Explained program to pt  Can come to [facility] and start on 02/16/22 M/W [id number] Will call her closer to start of class to do intake interview

## 2022-01-15 ENCOUNTER — Ambulatory Visit (INDEPENDENT_AMBULATORY_CARE_PROVIDER_SITE_OTHER): Payer: PPO | Admitting: Orthopaedic Surgery

## 2022-01-15 ENCOUNTER — Encounter: Payer: Self-pay | Admitting: Orthopaedic Surgery

## 2022-01-15 DIAGNOSIS — M1711 Unilateral primary osteoarthritis, right knee: Secondary | ICD-10-CM

## 2022-01-15 MED ORDER — HYALURONAN 30 MG/2ML IX SOSY
30.0000 mg | PREFILLED_SYRINGE | INTRA_ARTICULAR | Status: AC | PRN
  Administered 2022-01-15: 30 mg via INTRA_ARTICULAR

## 2022-01-15 NOTE — Progress Notes (Signed)
Office Visit Note   Patient: Brooke Brown           Date of Birth: 01/07/1951           MRN: [id number] Visit Date: 01/15/2022              Requested by: Brooke Brown, Brooke Brown PCP: Brooke Brown   Assessment & Plan: Visit Diagnoses:  1. Unilateral primary osteoarthritis, right knee     Plan: [name] is here for her second Orthovisc injection into her right knee.  Although she had a little stiffness after the first injection she does feel like her knee pain is gotten better.  She will follow-up in 1 week for her third Orthovisc injection into the right knee  Follow-Up Instructions: 1 week  Orders:  Orders Placed This Encounter  Procedures   Large Joint Inj: R knee   No orders of the defined types were placed in this encounter.     Procedures: Large Joint Inj: R knee on 01/15/2022 2:39 PM Indications: pain and diagnostic evaluation Details: 25 G 1.5 in needle, anteromedial approach  Arthrogram: No  Medications: 30 mg Hyaluronan 30 MG/2ML Outcome: tolerated well, no immediate complications Procedure, treatment alternatives, risks and benefits explained, specific risks discussed. Consent was given by the patient.      Clinical Data: No additional findings.   Subjective: Chief Complaint  Patient presents with   Right Knee - Follow-up   Patient presents today for Orthovisc #2 in her right knee. She states that she has noticed that she has been having slight stiffness noticed throughout the day. Patient states that she has done well with first Orthovisc #1  Review of Systems  All other systems reviewed and are negative.    Objective: Vital Signs: There were no vitals taken for this visit.  Physical Exam Constitutional:      Appearance: Normal appearance.  Pulmonary:     Effort: Pulmonary effort is normal.  Neurological:     Mental Status: She is alert.     Ortho Exam Examination  of her right knee is not warm there is no erythema no effusion.  She has good range of motion.  Calves are soft and nontender. Specialty Comments:  No specialty comments available.  Imaging: No results found.   PMFS History: Patient Active Problem List   Diagnosis Date Noted   Chronic renal disease, stage II 01/08/2022   Unilateral primary osteoarthritis, right knee 12/09/2021   Generalized anxiety disorder 11/19/2021   High vitamin D level 11/19/2021   Pain in right knee 09/19/2021   Colon cancer screening 07/30/2021   Flatulence, eructation and gas pain 07/30/2021   Gastroesophageal reflux disease 07/30/2021   History of colonic polyps 07/30/2021   Right lower quadrant pain 07/30/2021   Constipation 07/30/2021   Osteoarthritis of carpometacarpal (CMC) joint of thumb 03/04/2021   Bilateral hand pain 03/04/2021   Atherosclerosis of aorta ([place]) 08/15/2020   Anosmia 02/27/2020   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 02/27/2020   Hypothyroidism 02/27/2020   Other long term (current) drug therapy 02/27/2020   BMI 25.0-25.9,adult 12/26/2019   Vitamin D deficiency disease 12/26/2019   Pure hypercholesterolemia 03/29/2019   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   Overweight (BMI 25.0-29.9) 03/29/2019   Right wrist pain 09/30/2015   Unspecified essential hypertension 12/20/2013   Other and unspecified hyperlipidemia 12/20/2013   Type 2 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 disease,  without long-term current use of insulin ([place]) 12/20/2013   Neuralgia 12/20/2013   Neck pain 12/20/2013   Dizziness 12/20/2013   Past Medical History:  Diagnosis Date   Anxiety    Arthritis    Diabetes mellitus    Grave's disease    Grave's disease    Hyperlipemia    Hypertension     Family History  Problem Relation Age of Onset   Pancreatic cancer Mother    Heart failure Father    Diabetes Brother    Breast cancer Cousin     Past Surgical History:   Procedure Laterality Date   ABDOMINAL HYSTERECTOMY     CATARACT EXTRACTION W/ INTRAOCULAR LENS  IMPLANT, BILATERAL Bilateral    CHOLECYSTECTOMY N/A 08/16/2017   Procedure: LAPAROSCOPIC CHOLECYSTECTOMY;  Surgeon: Judeth Horn, Brown;  Location: [facility];  Service: General;  Laterality: N/A;   TUBAL LIGATION     Social History   Occupational History   Occupation: retired  Tobacco Use   Smoking status: Never   Smokeless tobacco: Never  Vaping Use   Vaping Use: Never used  Substance and Sexual Activity   Alcohol use: Not Currently    Alcohol/week: 0.0 standard drinks of alcohol    Comment: rarely   Drug use: No   Sexual activity: Not Currently

## 2022-01-16 ENCOUNTER — Other Ambulatory Visit: Payer: Self-pay | Admitting: Internal Medicine

## 2022-01-16 DIAGNOSIS — Z1231 Encounter for screening mammogram for malignant neoplasm of breast: Secondary | ICD-10-CM

## 2022-01-22 ENCOUNTER — Encounter: Payer: Self-pay | Admitting: Orthopaedic Surgery

## 2022-01-22 ENCOUNTER — Ambulatory Visit (INDEPENDENT_AMBULATORY_CARE_PROVIDER_SITE_OTHER): Payer: PPO | Admitting: Orthopaedic Surgery

## 2022-01-22 DIAGNOSIS — M25561 Pain in right knee: Secondary | ICD-10-CM | POA: Diagnosis not present

## 2022-01-22 DIAGNOSIS — M1711 Unilateral primary osteoarthritis, right knee: Secondary | ICD-10-CM | POA: Diagnosis not present

## 2022-01-22 MED ORDER — HYALURONAN 30 MG/2ML IX SOSY
30.0000 mg | PREFILLED_SYRINGE | INTRA_ARTICULAR | Status: AC | PRN
  Administered 2022-01-22: 30 mg via INTRA_ARTICULAR

## 2022-01-22 NOTE — Progress Notes (Signed)
Office Visit Note   Patient: Brooke Brown           Date of Birth: 01-May-1951           MRN: [id number] Visit Date: 01/22/2022              Requested by: Glendale Chard, [address] [place],  [place] [address] PCP: Glendale Chard, MD   Assessment & Plan: Visit Diagnoses:  1. Acute pain of right knee     Plan: Brooke Brown is a pleasant 71 year old woman who is here for her third Orthovisc injection into her right knee.  She has tolerated the previous 2 injections quite well.  She does think she is getting some relief.  She understands that she cannot have another series of these injections for 6 months from the date of the last injection.  However she does have some recurrence of pain certainly she can come in for steroid injection  Follow-Up Instructions: Return if symptoms worsen or fail to improve.   Orders:  No orders of the defined types were placed in this encounter.  No orders of the defined types were placed in this encounter.     Procedures: Large Joint Inj: R knee on 01/22/2022 8:29 AM Indications: pain and diagnostic evaluation Details: 25 G 1.5 in needle, anteromedial approach  Arthrogram: No  Medications: 30 mg Hyaluronan 30 MG/2ML Outcome: tolerated well, no immediate complications Procedure, treatment alternatives, risks and benefits explained, specific risks discussed. Consent was given by the patient.     Clinical Data: No additional findings.   Subjective: Chief Complaint  Patient presents with   Right Knee - Follow-up   Patient presents today for Orthovisc #3 in her right knee. She states that her right knee pain has decreased however still has noticed slight stiffness in the mornings. She states that she has been taken over the counter tylenol for pain.   Review of Systems  All other systems reviewed and are negative.    Objective: Vital Signs: There were no vitals taken for this visit.  Physical  Exam Constitutional:      Appearance: Normal appearance.  Neurological:     Mental Status: She is alert.     Ortho Exam Examination of her right knee she has no warmth redness or effusion.  Good range of motion compartments of lower leg are soft Specialty Comments:  No specialty comments available.  Imaging: No results found.   PMFS History: Patient Active Problem List   Diagnosis Date Noted   Chronic renal disease, stage II 01/08/2022   Unilateral primary osteoarthritis, right knee 12/09/2021   Generalized anxiety disorder 11/19/2021   High vitamin D level 11/19/2021   Pain in right knee 09/19/2021   Colon cancer screening 07/30/2021   Flatulence, eructation and gas pain 07/30/2021   Gastroesophageal reflux disease 07/30/2021   History of colonic polyps 07/30/2021   Right lower quadrant pain 07/30/2021   Constipation 07/30/2021   Osteoarthritis of carpometacarpal (CMC) joint of thumb 03/04/2021   Bilateral hand pain 03/04/2021   Atherosclerosis of aorta ([facility]) 08/15/2020   Anosmia 02/27/2020   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 02/27/2020   Hypothyroidism 02/27/2020   Other long term (current) drug therapy 02/27/2020   BMI 25.0-25.9,adult 12/26/2019   Vitamin D deficiency disease 12/26/2019   Pure hypercholesterolemia 03/29/2019   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   Overweight (BMI 25.0-29.9) 03/29/2019   Right wrist pain 09/30/2015   Unspecified essential hypertension 12/20/2013  Other and unspecified hyperlipidemia 12/20/2013   Type 2 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 disease, without long-term current use of insulin ([facility]) 12/20/2013   Neuralgia 12/20/2013   Neck pain 12/20/2013   Dizziness 12/20/2013   Past Medical History:  Diagnosis Date   Anxiety    Arthritis    Diabetes mellitus    Grave's disease    Grave's disease    Hyperlipemia    Hypertension     Family History  Problem Relation Age of Onset    Pancreatic cancer Mother    Heart failure Father    Diabetes Brother    Breast cancer Cousin     Past Surgical History:  Procedure Laterality Date   ABDOMINAL HYSTERECTOMY     CATARACT EXTRACTION W/ INTRAOCULAR LENS  IMPLANT, BILATERAL Bilateral    CHOLECYSTECTOMY N/A 08/16/2017   Procedure: LAPAROSCOPIC CHOLECYSTECTOMY;  Surgeon: Judeth Horn, MD;  Location: [facility];  Service: General;  Laterality: N/A;   TUBAL LIGATION     Social History   Occupational History   Occupation: retired  Tobacco Use   Smoking status: Never   Smokeless tobacco: Never  Vaping Use   Vaping Use: Never used  Substance and Sexual Activity   Alcohol use: Not Currently    Alcohol/week: 0.0 standard drinks of alcohol    Comment: rarely   Drug use: No   Sexual activity: Not Currently

## 2022-02-03 NOTE — Progress Notes (Signed)
Called pt reference PREP class on 02/16/22. Still prefers mornings. Advised class will be a large class and she is okay with that.  Intake scheduled for 9/19 at 3pm at [facility]. Will meet her at the front desk

## 2022-02-13 ENCOUNTER — Telehealth: Payer: Self-pay

## 2022-02-13 NOTE — Telephone Encounter (Signed)
Received message yesterday reference needing to reschedule intake.  Asked for cb had some questions about PREP class  VMT pt this am requesting call back to answer any questions and confirm she wants to start on 02/16/22

## 2022-02-23 ENCOUNTER — Encounter: Payer: Self-pay | Admitting: Internal Medicine

## 2022-02-23 ENCOUNTER — Ambulatory Visit (INDEPENDENT_AMBULATORY_CARE_PROVIDER_SITE_OTHER): Payer: PPO | Admitting: Internal Medicine

## 2022-02-23 VITALS — BP 122/80 | HR 71 | Temp 98.0°F | Wt 149.2 lb

## 2022-02-23 DIAGNOSIS — Z23 Encounter for immunization: Secondary | ICD-10-CM | POA: Diagnosis not present

## 2022-02-23 DIAGNOSIS — E7800 Pure hypercholesterolemia, unspecified: Secondary | ICD-10-CM

## 2022-02-23 DIAGNOSIS — F411 Generalized anxiety disorder: Secondary | ICD-10-CM

## 2022-02-23 DIAGNOSIS — E1122 Type 2 diabetes mellitus with diabetic chronic kidney disease: Secondary | ICD-10-CM | POA: Diagnosis not present

## 2022-02-23 DIAGNOSIS — N182 Chronic kidney disease, stage 2 (mild): Secondary | ICD-10-CM | POA: Diagnosis not present

## 2022-02-23 DIAGNOSIS — I129 Hypertensive chronic kidney disease with stage 1 through stage 4 chronic kidney disease, or unspecified chronic kidney disease: Secondary | ICD-10-CM | POA: Diagnosis not present

## 2022-02-23 DIAGNOSIS — Z0000 Encounter for general adult medical examination without abnormal findings: Secondary | ICD-10-CM | POA: Diagnosis not present

## 2022-02-23 LAB — POCT URINALYSIS DIPSTICK
Bilirubin, UA: NEGATIVE
Blood, UA: NEGATIVE
Glucose, UA: NEGATIVE
Ketones, UA: NEGATIVE
Nitrite, UA: NEGATIVE
Protein, UA: NEGATIVE
Spec Grav, UA: 1.015 (ref 1.010–1.025)
Urobilinogen, UA: 0.2 E.U./dL
pH, UA: 6 (ref 5.0–8.0)

## 2022-02-23 MED ORDER — SERTRALINE HCL 50 MG PO TABS: 50.0000 mg | ORAL_TABLET | Freq: Every day | ORAL | 2 refills | Status: DC

## 2022-02-23 MED ORDER — ATORVASTATIN CALCIUM 40 MG PO TABS: ORAL_TABLET | ORAL | 3 refills | Status: DC

## 2022-02-23 NOTE — Progress Notes (Signed)
Barnet Glasgow Martin,acting as a Education administrator for [name], MD.,have documented all relevant documentation on the behalf of [name], MD,as directed by  [name], MD while in the presence of [name], MD.   Subjective:     Patient ID: Brooke Brown , female    DOB: 04-18-1951 , 71 y.o.   MRN: [id number]   Chief Complaint  Patient presents with   Annual Exam   Diabetes   Hypertension    HPI  The patient is here today for a physical examination. She is followed by GYN for her pelvic exams. She reports compliance with meds. She denies headaches, chest pain and shortness of breath.  She was started on sertraline at her last visit. She does not feel the current dose helps with her sx.    Diabetes She presents for her follow-up diabetic visit. She has type 2 diabetes mellitus. Her disease course has been stable. There are no hypoglycemic associated symptoms. Pertinent negatives for diabetes include no blurred vision, no polydipsia, no polyphagia and no polyuria. There are no hypoglycemic complications. Diabetic complications include nephropathy. Risk factors for coronary artery disease include diabetes mellitus, dyslipidemia, hypertension, post-menopausal and sedentary lifestyle. Her weight is stable. She is following a diabetic diet. She participates in exercise three times a week. Her breakfast blood glucose is taken between 8-9 am. Her breakfast blood glucose range is generally 110-130 mg/dl. Eye exam is current.  Hypertension This is a chronic problem. The current episode started more than 1 year ago. The problem has been gradually improving since onset. The problem is controlled. Pertinent negatives include no blurred vision, neck pain or palpitations.     Past Medical History:  Diagnosis Date   Anxiety    Arthritis    Diabetes mellitus    Grave's disease    Grave's disease    Hyperlipemia    Hypertension      Family History  Problem Relation Age of  Onset   Pancreatic cancer Mother    Heart failure Father    Diabetes Brother    Breast cancer Cousin      Current Outpatient Medications:    acetaminophen (TYLENOL) 500 MG tablet, Take 500 mg by mouth daily as needed for moderate pain or headache., Disp: , Rfl:    amLODipine (NORVASC) 5 MG tablet, Take 1 tablet by mouth once daily, Disp: 90 tablet, Rfl: 0   aspirin EC 81 MG tablet, Take 81 mg by mouth daily., Disp: , Rfl:    Blood Glucose Monitoring Suppl (ONE TOUCH ULTRA 2) w/Device KIT, Use to check blood sugar 3 times a day. Dx code e11.65, Disp: 1 kit, Rfl: 2   Carboxymethylcellul-Glycerin (LUBRICATING EYE DROPS OP), Place 1 drop into both eyes 2 (two) times daily., Disp: , Rfl:    hydrochlorothiazide (HYDRODIURIL) 25 MG tablet, Take 1 tablet by mouth once daily, Disp: 90 tablet, Rfl: 0   Magnesium 250 MG TABS, otc, Disp:  , Rfl: 0   metFORMIN (GLUCOPHAGE-XR) 500 MG 24 hr tablet, Take 1 tablet by mouth twice daily, Disp: 180 tablet, Rfl: 0   olmesartan (BENICAR) 40 MG tablet, Take 1 tablet by mouth once daily, Disp: 90 tablet, Rfl: 0   OneTouch Delica Lancets 51W MISC, USE AS DIRECTED TO CHECK BLOOD SUGARS ONCE DAILY, Disp: 100 each, Rfl: 2   ONETOUCH ULTRA test strip, USE AS INSTRUCTED TO CHECK BLOOD SUGARS ONE TIME PER DAY, Disp: 100 each, Rfl: 2   sertraline (ZOLOFT)  50 MG tablet, Take 1 tablet (50 mg total) by mouth daily., Disp: 90 tablet, Rfl: 2   SYNTHROID 112 MCG tablet, TAKE 1 TABLET MONDAY THROUGH SATURDAY AND 1/2 TABLET ON SUNDAY, Disp: 90 tablet, Rfl: 1   atorvastatin (LIPITOR) 40 MG tablet, Take one tab MWF and Saturdays, Disp: 48 tablet, Rfl: 3   No Known Allergies    The patient states she uses post menopausal status for birth control. Last LMP was No LMP recorded. Patient has had a hysterectomy.. Negative for Dysmenorrhea. Negative for: breast discharge, breast lump(s), breast pain and breast self exam. Associated symptoms include abnormal vaginal bleeding. Pertinent  negatives include abnormal bleeding (hematology), anxiety, decreased libido, depression, difficulty falling sleep, dyspareunia, history of infertility, nocturia, sexual dysfunction, sleep disturbances, urinary incontinence, urinary urgency, vaginal discharge and vaginal itching. Diet regular.The patient states her exercise level is  moderate.  . The patient's tobacco use is:  Social History   Tobacco Use  Smoking Status Never  Smokeless Tobacco Never  . She has been exposed to passive smoke. The patient's alcohol use is:  Social History   Substance and Sexual Activity  Alcohol Use Not Currently   Alcohol/week: 0.0 standard drinks of alcohol   Comment: rarely   Review of Systems  Constitutional: Negative.   HENT: Negative.    Eyes: Negative.  Negative for blurred vision.  Respiratory: Negative.    Cardiovascular: Negative.  Negative for palpitations.  Gastrointestinal: Negative.   Endocrine: Negative.  Negative for polydipsia, polyphagia and polyuria.  Genitourinary: Negative.   Musculoskeletal: Negative.  Negative for neck pain.  Skin: Negative.   Allergic/Immunologic: Negative.   Neurological: Negative.   Hematological: Negative.   Psychiatric/Behavioral: Negative.       Today's Vitals   02/23/22 1036  BP: 122/80  Pulse: 71  Temp: 98 F (36.7 C)  TempSrc: Oral  Weight: 149 lb 3.2 oz (67.7 kg)   Body mass index is 24.83 kg/m.   Objective:  Physical Exam Vitals and nursing note reviewed.  Constitutional:      Appearance: Normal appearance.  HENT:     Head: Normocephalic and atraumatic.     Right Ear: Tympanic membrane, ear canal and external ear normal.     Left Ear: Tympanic membrane, ear canal and external ear normal.     Nose:     Comments: Masked     Mouth/Throat:     Comments: Masked  Eyes:     Extraocular Movements: Extraocular movements intact.     Conjunctiva/sclera: Conjunctivae normal.     Pupils: Pupils are equal, round, and reactive to light.   Cardiovascular:     Rate and Rhythm: Normal rate and regular rhythm.     Pulses: Normal pulses.          Dorsalis pedis pulses are 2+ on the right side and 2+ on the left side.     Heart sounds: Normal heart sounds.  Pulmonary:     Effort: Pulmonary effort is normal.     Breath sounds: Normal breath sounds.  Chest:  Breasts:    Tanner Score is 5.     Right: Normal.     Left: Normal.  Abdominal:     General: Abdomen is flat. Bowel sounds are normal.     Palpations: Abdomen is soft.  Genitourinary:    Comments: deferred Musculoskeletal:        General: Normal range of motion.     Cervical back: Normal range of motion and neck supple.  Comments: Splint on left hand  Feet:     Right foot:     Protective Sensation: 5 sites tested.  5 sites sensed.     Skin integrity: Skin integrity normal.     Toenail Condition: Right toenails are normal.     Left foot:     Protective Sensation: 5 sites tested.  5 sites sensed.     Skin integrity: Skin integrity normal.     Toenail Condition: Left toenails are normal.  Skin:    General: Skin is warm and dry.  Neurological:     General: No focal deficit present.     Mental Status: She is alert and oriented to person, place, and time.  Psychiatric:        Mood and Affect: Mood normal.        Behavior: Behavior normal.       Assessment And Plan:     1. Annual physical exam Comments: A full exam was performed, Importance of monthly self breast exams was discussed with the patient. PATIENT IS ADVISED TO GET 30-45 MINUTES REGULAR EXERCISE NO LESS THAN FOUR TO FIVE DAYS PER WEEK - BOTH WEIGHTBEARING EXERCISES AND AEROBIC ARE RECOMMENDED.  PATIENT IS ADVISED TO FOLLOW A HEALTHY DIET WITH AT LEAST SIX FRUITS/VEGGIES PER DAY, DECREASE INTAKE OF RED MEAT, AND TO INCREASE FISH INTAKE TO TWO DAYS PER WEEK.  MEATS/FISH SHOULD NOT BE FRIED, BAKED OR BROILED IS PREFERABLE.  IT IS ALSO IMPORTANT TO CUT BACK ON YOUR SUGAR INTAKE. PLEASE AVOID ANYTHING WITH  ADDED SUGAR, CORN SYRUP OR OTHER SWEETENERS. IF YOU MUST USE A SWEETENER, YOU CAN TRY STEVIA. IT IS ALSO IMPORTANT TO AVOID ARTIFICIALLY SWEETENERS AND DIET BEVERAGES. LASTLY, I SUGGEST WEARING SPF 50 SUNSCREEN ON EXPOSED PARTS AND ESPECIALLY WHEN IN THE DIRECT SUNLIGHT FOR AN EXTENDED PERIOD OF TIME.  PLEASE AVOID FAST FOOD RESTAURANTS AND INCREASE YOUR WATER INTAKE.   2. Type 2 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 disease, without long-term current use of insulin ([facility]) Comments: Chronic, diabetic foot exam was performed. She will rto in 61months for re-evaluation.  She will c/w metformin $RemoveBefor'500mg'[id number]$  BID. Will consider Farxiga at next visit. I reviewed patient's medical record, external notes, lab results, and imaging reports. As I have discussed with the patient in detail, compliance with diabetes medications, meal planning, exercise, and self-monitoring regimens are essential to improving glucose control. Target HgbA1C is 7.0%. We discussed blood sugar goals including decreasing HgbA1C to target, fasting & premeal blood sugars between 80-130, and 2 hr post meal blood sugars <160. Emphasized that for every 1% that the HgbA1C is above this target range, risk of microvascular complications, including retinopathy, nephropathy, and neuropathy increase by 30%, and risk of macrovascular disease, including heart attack, peripheral vascular disease, and stroke increased by 15%. We discussed carbohydrates and their influence on blood sugars and cited examples of common higher carb foods to include junk food, bread, rice, pasta, corn, potatoes, fruit, and fruit juices to name a few. We discussed estimating carbohydrate portion sizes, the importance of meal planning and balancing carbohydrate intake. The positive impact of an active lifestyle on diabetes and health explained, and goals set to incorporate more activity and exercise into weekly schedule. Hypoglycemia recognition, prevention, and treatment discussed with  patient. If patient's BG is low, discussed eating 15g of carbs and waiting 15 minutes and rechecking BG, until BG is above 70. If BG doesn't improve to normal, patient is to call office or doctor on call if  after hours. - Microalbumin / Creatinine Urine Ratio - POCT Urinalysis Dipstick (81002) - CBC no Diff - CMP14+EGFR - Hemoglobin A1c  3. Benign hypertensive renal disease Comments: Chronic, well controlled. EKG performed, NSR w/ RAE. She will c/w amlodipine 5, olmesartan 40 and hctz 31m qd. She will f/u in 4-6 months.  - Microalbumin / Creatinine Urine Ratio - POCT Urinalysis Dipstick (81002) - EKG 12-Lead - CBC no Diff - CMP14+EGFR  4. Generalized anxiety disorder Comments: Chronic, I will increase sertraline to 585m She agrees to rto in six weeks for re-evaluation.  - sertraline (ZOLOFT) 50 MG tablet; Take 1 tablet (50 mg total) by mouth daily.  Dispense: 90 tablet; Refill: 2  5. Pure hypercholesterolemia Comments: Last LDL 91 Aug 2023, improved not yet at goal. I will add another day. She will now take MWF and Saturday. I will send rx with new dosing schedule.  6. Immunization due - Flu Vaccine QUAD High Dose(Fluad) - Tdap vaccine greater than or equal to 7yo IM  Patient was given opportunity to ask questions. Patient verbalized understanding of the plan and was able to repeat key elements of the plan. All questions were answered to their satisfaction.   I, RoMaximino GreenlandMD, have reviewed all documentation for this visit. The documentation on 02/23/22 for the exam, diagnosis, procedures, and orders are all accurate and complete.    THE PATIENT IS ENCOURAGED TO PRACTICE SOCIAL DISTANCING DUE TO THE COVID-19 PANDEMIC.

## 2022-02-23 NOTE — Patient Instructions (Signed)

## 2022-02-24 LAB — CMP14+EGFR
ALT: 14 IU/L (ref 0–32)
AST: 26 IU/L (ref 0–40)
Albumin/Globulin Ratio: 1.3 (ref 1.2–2.2)
Albumin: 4.6 g/dL (ref 3.8–4.8)
Alkaline Phosphatase: 64 IU/L (ref 44–121)
BUN/Creatinine Ratio: 16 (ref 12–28)
BUN: 12 mg/dL (ref 8–27)
Bilirubin Total: 0.3 mg/dL (ref 0.0–1.2)
CO2: 23 mmol/L (ref 20–29)
Calcium: 10 mg/dL (ref 8.7–10.3)
Chloride: 101 mmol/L (ref 96–106)
Creatinine, Ser: 0.75 mg/dL (ref 0.57–1.00)
Globulin, Total: 3.5 g/dL (ref 1.5–4.5)
Glucose: 103 mg/dL — ABNORMAL HIGH (ref 70–99)
Potassium: 4 mmol/L (ref 3.5–5.2)
Sodium: 140 mmol/L (ref 134–144)
Total Protein: 8.1 g/dL (ref 6.0–8.5)
eGFR: 84 mL/min/{1.73_m2} (ref >59)

## 2022-02-24 LAB — CBC
Hematocrit: 39.1 % (ref 34.0–46.6)
Hemoglobin: 13 g/dL (ref 11.1–15.9)
MCH: 28.3 pg (ref 26.6–33.0)
MCHC: 33.2 g/dL (ref 31.5–35.7)
MCV: 85 fL (ref 79–97)
Platelets: 354 10*3/uL (ref 150–450)
RBC: 4.6 x10E6/uL (ref 3.77–5.28)
RDW: 12.5 % (ref 11.7–15.4)
WBC: 5.2 10*3/uL (ref 3.4–10.8)

## 2022-02-24 LAB — HEMOGLOBIN A1C
Est. average glucose Bld gHb Est-mCnc: 140 mg/dL
Hgb A1c MFr Bld: 6.5 % — ABNORMAL HIGH (ref 4.8–5.6)

## 2022-02-24 LAB — MICROALBUMIN / CREATININE URINE RATIO
Creatinine, Urine: 117.3 mg/dL
Microalb/Creat Ratio: 3 mg/g creat (ref 0–29)
Microalbumin, Urine: 3.9 ug/mL

## 2022-02-27 ENCOUNTER — Ambulatory Visit
Admission: RE | Admit: 2022-02-27 | Discharge: 2022-02-27 | Disposition: A | Discharge status: Home / Self Care | Payer: PPO | Source: Ambulatory Visit | Attending: Internal Medicine | Admitting: Internal Medicine

## 2022-02-27 DIAGNOSIS — Z1231 Encounter for screening mammogram for malignant neoplasm of breast: Secondary | ICD-10-CM

## 2022-03-01 ENCOUNTER — Emergency Department (HOSPITAL_BASED_OUTPATIENT_CLINIC_OR_DEPARTMENT_OTHER): Payer: PPO | Admitting: Radiology

## 2022-03-01 ENCOUNTER — Other Ambulatory Visit: Payer: Self-pay

## 2022-03-01 ENCOUNTER — Emergency Department (HOSPITAL_BASED_OUTPATIENT_CLINIC_OR_DEPARTMENT_OTHER)
Admission: EM | Admit: 2022-03-01 | Discharge: 2022-03-01 | Disposition: A | Discharge status: Home / Self Care | Payer: PPO | Attending: Student | Admitting: Student

## 2022-03-01 ENCOUNTER — Encounter: Payer: Self-pay | Admitting: Emergency Medicine

## 2022-03-01 ENCOUNTER — Ambulatory Visit
Admission: EM | Admit: 2022-03-01 | Discharge: 2022-03-01 | Disposition: A | Discharge status: Home / Self Care | Payer: PPO | Attending: Internal Medicine | Admitting: Internal Medicine

## 2022-03-01 DIAGNOSIS — Z7982 Long term (current) use of aspirin: Secondary | ICD-10-CM | POA: Diagnosis not present

## 2022-03-01 DIAGNOSIS — R55 Syncope and collapse: Secondary | ICD-10-CM | POA: Insufficient documentation

## 2022-03-01 DIAGNOSIS — R42 Dizziness and giddiness: Secondary | ICD-10-CM | POA: Diagnosis not present

## 2022-03-01 DIAGNOSIS — Z79899 Other long term (current) drug therapy: Secondary | ICD-10-CM | POA: Diagnosis not present

## 2022-03-01 DIAGNOSIS — N182 Chronic kidney disease, stage 2 (mild): Secondary | ICD-10-CM | POA: Diagnosis not present

## 2022-03-01 DIAGNOSIS — E039 Hypothyroidism, unspecified: Secondary | ICD-10-CM | POA: Insufficient documentation

## 2022-03-01 DIAGNOSIS — R072 Precordial pain: Secondary | ICD-10-CM | POA: Insufficient documentation

## 2022-03-01 DIAGNOSIS — Z7984 Long term (current) use of oral hypoglycemic drugs: Secondary | ICD-10-CM | POA: Insufficient documentation

## 2022-03-01 DIAGNOSIS — R0789 Other chest pain: Secondary | ICD-10-CM | POA: Diagnosis not present

## 2022-03-01 DIAGNOSIS — R61 Generalized hyperhidrosis: Secondary | ICD-10-CM | POA: Diagnosis not present

## 2022-03-01 DIAGNOSIS — E1122 Type 2 diabetes mellitus with diabetic chronic kidney disease: Secondary | ICD-10-CM | POA: Insufficient documentation

## 2022-03-01 DIAGNOSIS — I129 Hypertensive chronic kidney disease with stage 1 through stage 4 chronic kidney disease, or unspecified chronic kidney disease: Secondary | ICD-10-CM | POA: Insufficient documentation

## 2022-03-01 DIAGNOSIS — R079 Chest pain, unspecified: Secondary | ICD-10-CM | POA: Diagnosis not present

## 2022-03-01 LAB — CBC
HCT: 38.2 % (ref 36.0–46.0)
Hemoglobin: 12.5 g/dL (ref 12.0–15.0)
MCH: 28.6 pg (ref 26.0–34.0)
MCHC: 32.7 g/dL (ref 30.0–36.0)
MCV: 87.4 fL (ref 80.0–100.0)
Platelets: 326 10*3/uL (ref 150–400)
RBC: 4.37 MIL/uL (ref 3.87–5.11)
RDW: 13.4 % (ref 11.5–15.5)
WBC: 6.4 10*3/uL (ref 4.0–10.5)
nRBC: 0 % (ref 0.0–0.2)

## 2022-03-01 LAB — BASIC METABOLIC PANEL
Anion gap: 10 (ref 5–15)
BUN: 14 mg/dL (ref 8–23)
CO2: 27 mmol/L (ref 22–32)
Calcium: 9.8 mg/dL (ref 8.9–10.3)
Chloride: 103 mmol/L (ref 98–111)
Creatinine, Ser: 0.83 mg/dL (ref 0.44–1.00)
GFR, Estimated: >60 mL/min (ref >60)
Glucose, Bld: 102 mg/dL — ABNORMAL HIGH (ref 70–99)
Potassium: 3.9 mmol/L (ref 3.5–5.1)
Sodium: 140 mmol/L (ref 135–145)

## 2022-03-01 LAB — TROPONIN I (HIGH SENSITIVITY)
Troponin I (High Sensitivity): <2 ng/L (ref <18)
Troponin I (High Sensitivity): <2 ng/L (ref <18)

## 2022-03-01 LAB — POCT FASTING CBG KUC MANUAL ENTRY: POCT Glucose (KUC): 152 mg/dL — AB (ref 70–99)

## 2022-03-01 NOTE — ED Triage Notes (Addendum)
Pt is present today with c/o dizziness and slight chest tightness. Pt states that she feels like the room is spinning and she is seeing "black". Pt state that her sx started this morning at church

## 2022-03-01 NOTE — ED Provider Notes (Signed)
Blood pressure 131/63, pulse 60, temperature 97.7 F (36.5 C), temperature source Oral, resp. rate (!) 24, height '5\' 5"'$  (1.651 m), weight 67.7 kg, SpO2 100 %.  Assuming care from Dr. Matilde Sprang.  In short, Brooke Brown is a 71 y.o. female with a chief complaint of Loss of Consciousness and Chest Pain .  Refer to the original H&P for additional details.  The current plan of care is to follow up on repeat troponin.  03:37 PM  Second troponin is negative.  Patient continues to be asymptomatic and EKG reassuring.  Plan for outpatient cardiology referral.  Discussed strict ED return precautions.       Margette Fast, MD 03/01/22 1538

## 2022-03-01 NOTE — Discharge Instructions (Signed)
Go to the emergency department as soon as you leave urgent care for further evaluation and management. 

## 2022-03-01 NOTE — ED Provider Notes (Signed)
[facility] Provider Note  CSN: [id number] Arrival date & time: 03/01/22 1205  Chief Complaint(s) Loss of Consciousness and Chest Pain  HPI Wilmarie Sparlin Kernodle is a 71 y.o. female with PMH T2DM, Graves' disease, HTN, HLD, anxiety who presents emergency department for evaluation of a presyncopal event.  Patient states that she was in church sitting on a church bench when she had an episode of diaphoresis and dizziness.  She leaned against her husband and did not have full syncope but husband states that she was mildly diaphoretic has ultimately come back to normal mental status baseline.  She went to urgent care who found the patient to be in a possible heart block and sent her to the emergency department for further evaluation.  Here in the emergency department, patient is completely asymptomatic with no persistent chest pain, shortness of breath, abdominal pain, nausea, vomiting, dizziness or other systemic symptoms.   Past Medical History Past Medical History:  Diagnosis Date   Anxiety    Arthritis    Diabetes mellitus    Grave's disease    Grave's disease    Hyperlipemia    Hypertension    Patient Active Problem List   Diagnosis Date Noted   Chronic renal disease, stage II 01/08/2022   Unilateral primary osteoarthritis, right knee 12/09/2021   Generalized anxiety disorder 11/19/2021   High vitamin D level 11/19/2021   Pain in right knee 09/19/2021   Colon cancer screening 07/30/2021   Flatulence, eructation and gas pain 07/30/2021   Gastroesophageal reflux disease 07/30/2021   History of colonic polyps 07/30/2021   Right lower quadrant pain 07/30/2021   Constipation 07/30/2021   Osteoarthritis of carpometacarpal (CMC) joint of thumb 03/04/2021   Bilateral hand pain 03/04/2021   Atherosclerosis of aorta ([place]) 08/15/2020   Anosmia 02/27/2020   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 02/27/2020   Hypothyroidism 02/27/2020    Other long term (current) drug therapy 02/27/2020   BMI 25.0-25.9,adult 12/26/2019   Vitamin D deficiency disease 12/26/2019   Pure hypercholesterolemia 03/29/2019   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 Authorizing Provider  acetaminophen (TYLENOL) 500 MG tablet Take 500 mg by mouth daily as needed for moderate pain or headache.    [provider]  amLODipine (NORVASC) 5 MG tablet Take 1 tablet by mouth once daily 12/12/21   Glendale Chard, MD  aspirin EC 81 MG tablet Take 81 mg by mouth daily.    [provider]  atorvastatin (LIPITOR) 40 MG tablet Take one tab MWF and Saturdays 02/23/22   Glendale Chard, MD  Blood Glucose Monitoring Suppl (ONE TOUCH ULTRA 2) w/Device KIT Use to check blood sugar 3 times a day. Dx code e11.65 02/27/20   Glendale Chard, MD  Carboxymethylcellul-Glycerin (LUBRICATING EYE DROPS OP) Place 1 drop into both eyes 2 (two) times daily.    [provider]  hydrochlorothiazide (HYDRODIURIL) 25 MG tablet Take 1 tablet by mouth once daily 12/12/21   Glendale Chard, MD  Magnesium 250 MG TABS otc 03/30/19   Glendale Chard, MD  metFORMIN [facility])  500 MG 24 hr tablet Take 1 tablet by mouth twice daily 12/12/21   Glendale Chard, MD  olmesartan [facility]) 40 MG tablet Take 1 tablet by mouth once daily 12/12/21   Glendale Chard, MD  OneTouch Delica Lancets 41Y MISC USE AS DIRECTED TO CHECK BLOOD SUGARS ONCE DAILY 05/05/21   Glendale Chard, MD  [facility] ULTRA test strip USE AS INSTRUCTED TO  CHECK BLOOD SUGARS ONE TIME PER DAY 05/05/21   Glendale Chard, MD  sertraline (ZOLOFT) 50 MG tablet Take 1 tablet (50 mg total) by mouth daily. 02/23/22 02/23/23  Glendale Chard, MD  SYNTHROID 112 MCG tablet TAKE 1 TABLET MONDAY THROUGH SATURDAY AND 1/2 TABLET ON SUNDAY 01/06/22   Glendale Chard, MD                                                                                                                                    Past Surgical History Past Surgical History:  Procedure Laterality Date   ABDOMINAL HYSTERECTOMY     CATARACT EXTRACTION W/ INTRAOCULAR LENS  IMPLANT, BILATERAL Bilateral    CHOLECYSTECTOMY N/A 08/16/2017   Procedure: LAPAROSCOPIC CHOLECYSTECTOMY;  Surgeon: Judeth Horn, MD;  Location: [facility];  Service: General;  Laterality: N/A;   TUBAL LIGATION     Family History Family History  Problem Relation Age of Onset   Pancreatic cancer Mother    Heart failure Father    Diabetes Brother    Breast cancer Cousin     Social History Social History   Tobacco Use   Smoking status: Never   Smokeless tobacco: Never  Vaping Use   Vaping Use: Never used  Substance Use Topics   Alcohol use: Not Currently    Alcohol/week: 0.0 standard drinks of alcohol    Comment: rarely   Drug use: No   Allergies Patient has no known allergies.  Review of Systems Review of Systems  Constitutional:  Positive for diaphoresis.  Neurological:  Positive for syncope.    Physical Exam Vital Signs  I have reviewed the triage vital signs BP 115/60 (BP Location: Right Arm)   Pulse (!) 52   Temp 98.2 F (36.8 C)   Resp 14   Ht 5' 5" (1.651 m)   Wt 67.7 kg   SpO2 99%   BMI 24.84 kg/m   Physical Exam Vitals and nursing note reviewed.  Constitutional:      General: She is not in acute distress.    Appearance: She is well-developed.  HENT:     Head: Normocephalic and atraumatic.  Eyes:     Conjunctiva/sclera: Conjunctivae normal.  Cardiovascular:     Rate and Rhythm: Normal rate  and regular rhythm.     Heart sounds: No murmur heard. Pulmonary:     Effort: Pulmonary effort is normal. No respiratory distress.     Breath sounds: Normal breath sounds.  Abdominal:     Palpations: Abdomen is soft.  Tenderness: There is no abdominal tenderness.  Musculoskeletal:        General: No swelling.     Cervical back: Neck supple.  Skin:    General: Skin is warm and dry.     Capillary Refill: Capillary refill takes less than 2 seconds.  Neurological:     Mental Status: She is alert.  Psychiatric:        Mood and Affect: Mood normal.     ED Results and Treatments Labs (all labs ordered are listed, but only abnormal results are displayed) Labs Reviewed  BASIC METABOLIC PANEL - Abnormal; Notable for the following components:      Result Value   Glucose, Bld 102 (*)    All other components within normal limits  CBC  TROPONIN I (HIGH SENSITIVITY)  TROPONIN I (HIGH SENSITIVITY)                                                                                                                          Radiology DG Chest 2 View  Result Date: 03/01/2022 CLINICAL DATA:  Chest pain EXAM: CHEST - 2 VIEW COMPARISON:  Chest x-ray August 09, 2017 FINDINGS: The cardiomediastinal silhouette is unchanged in contour. No focal pulmonary opacity. No pleural effusion or pneumothorax. Visualized upper abdomen is unremarkable. No acute osseous abnormality. IMPRESSION: No acute cardiopulmonary abnormality. Electronically Signed   By: Beryle Flock M.D.   On: 03/01/2022 12:53    Pertinent labs & imaging results that were available during my care of the patient were reviewed by me and considered in my medical decision making (see MDM for details).  Medications Ordered in ED Medications - No data to display                                                                                                                                   Procedures Procedures  (including critical care  time)  Medical Decision Making / ED Course   This patient presents to the ED for concern of presyncope, this involves an extensive number of treatment options, and is a complaint that carries with it a high risk of complications and morbidity.  The differential diagnosis includes orthostatic presyncope, cardiogenic presyncope, vasovagal presyncope, ACS, PE, electrolyte abnormality  MDM: Patient seen emergency room for evaluation of presyncope.  Physical exam is unremarkable.  Initial ECG with normal sinus rhythm.  Suspect underlying artifact from urgent  care ECG on my review of that ECG.  Laboratory evaluation unremarkable including negative high-sensitivity troponin.  At time of signout, patient pending delta troponin and if normal she would be safe for closer follow-up with cardiology and likely need for Zio patch.  She currently has no shortness of breath, hypoxia or persistent tachycardia and I have significant lower suspicion for pulmonary embolism as a source of her presentation today.  Patient certainly could have had an arrhythmia but over her 4-hour stay here in the emergency department she did not display any evidence of an abnormal heart rate or rhythm and this can be followed outpatient.  She was given strict return precautions instructed to return to the emergency department if a similar event occurs.  Patient then signed out to oncoming provider.  Please see provider signout for continuation of work-up.   Additional history obtained: -Additional history obtained from husband -External records from outside source obtained and reviewed including: Chart review including previous notes, labs, imaging, consultation notes   Lab Tests: -I ordered, reviewed, and interpreted labs.   The pertinent results include:   Labs Reviewed  BASIC METABOLIC PANEL - Abnormal; Notable for the following components:      Result Value   Glucose, Bld 102 (*)    All other components within normal limits   CBC  TROPONIN I (HIGH SENSITIVITY)  TROPONIN I (HIGH SENSITIVITY)       Imaging Studies ordered: I ordered imaging studies including CXR I independently visualized and interpreted imaging. I agree with the radiologist interpretation   Medicines ordered and prescription drug management: No orders of the defined types were placed in this encounter.   -I have reviewed the patients home medicines and have made adjustments as needed  Critical interventions none  Cardiac Monitoring: The patient was maintained on a cardiac monitor.  I personally viewed and interpreted the cardiac monitored which showed an underlying rhythm of: NSR  Social Determinants of Health:  Factors impacting patients care include: none   Reevaluation: After the interventions noted above, I reevaluated the patient and found that they have :improved  Co morbidities that complicate the patient evaluation  Past Medical History:  Diagnosis Date   Anxiety    Arthritis    Diabetes mellitus    Grave's disease    Grave's disease    Hyperlipemia    Hypertension       Dispostion: I considered admission for this patient, and disposition will be pending delta troponin and reevaluation by oncoming provider.  Please see provider signout for continuation of work-up.     Final Clinical Impression(s) / ED Diagnoses Final diagnoses:  Precordial chest pain  Near syncope     _0 @    Teressa Lower, MD 03/01/22 1958

## 2022-03-01 NOTE — ED Triage Notes (Signed)
Patient arrives with complaints of a syncopal episode with chest discomfort. Patient's husband states that the patient did briefly faint and recovered quickly.   Patient reports no chest pain at this time.  Reports a headache (5/10 pain).

## 2022-03-01 NOTE — Discharge Instructions (Signed)

## 2022-03-01 NOTE — ED Provider Notes (Signed)
[facility]    CSN: [id number] Arrival date & time: 03/01/22  1058      History   Chief Complaint Chief Complaint  Patient presents with   Dizziness    HPI Brooke Brown is a 71 y.o. female.   Patient presents after an episode of dizziness, chest discomfort, diaphoresis that occurred prior to arrival to urgent care.  Patient reports that she was sitting at Bible study in church when she started becoming dizzy and seeing black spots in her eyes.  She also reports that she was very sweaty.  She experienced some chest discomfort at that time as well.  Patient states that symptoms have now resolved.  She denies any associated headache, nausea, vomiting, fever.  Denies any history of cardiac problems.  She also reports that this has never occurred before.   Dizziness   Past Medical History:  Diagnosis Date   Anxiety    Arthritis    Diabetes mellitus    Grave's disease    Grave's disease    Hyperlipemia    Hypertension     Patient Active Problem List   Diagnosis Date Noted   Chronic renal disease, stage II 01/08/2022   Unilateral primary osteoarthritis, right knee 12/09/2021   Generalized anxiety disorder 11/19/2021   High vitamin D level 11/19/2021   Pain in right knee 09/19/2021   Colon cancer screening 07/30/2021   Flatulence, eructation and gas pain 07/30/2021   Gastroesophageal reflux disease 07/30/2021   History of colonic polyps 07/30/2021   Right lower quadrant pain 07/30/2021   Constipation 07/30/2021   Osteoarthritis of carpometacarpal (CMC) joint of thumb 03/04/2021   Bilateral hand pain 03/04/2021   Atherosclerosis of aorta ([place]) 08/15/2020   Anosmia 02/27/2020   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 02/27/2020   Hypothyroidism 02/27/2020   Other long term (current) drug therapy 02/27/2020   BMI 25.0-25.9,adult 12/26/2019   Vitamin D deficiency disease 12/26/2019   Pure hypercholesterolemia 03/29/2019   Benign  hypertensive renal disease 03/29/2019   Overweight (BMI 25.0-29.9) 03/29/2019   Right wrist pain 09/30/2015   Unspecified essential hypertension 12/20/2013   Other and unspecified hyperlipidemia 12/20/2013   Type 2 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 disease, without long-term current use of insulin ([place]) 12/20/2013   Neuralgia 12/20/2013   Neck pain 12/20/2013   Dizziness 12/20/2013    Past Surgical History:  Procedure Laterality Date   ABDOMINAL HYSTERECTOMY     CATARACT EXTRACTION W/ INTRAOCULAR LENS  IMPLANT, BILATERAL Bilateral    CHOLECYSTECTOMY N/A 08/16/2017   Procedure: LAPAROSCOPIC CHOLECYSTECTOMY;  Surgeon: Judeth Horn, MD;  Location: [facility];  Service: General;  Laterality: N/A;   TUBAL LIGATION      OB History   No obstetric history on file.      Home Medications    Prior to Admission medications   Medication Sig Start Date End Date Taking? Authorizing Provider  acetaminophen (TYLENOL) 500 MG tablet Take 500 mg by mouth daily as needed for moderate pain or headache.    [provider]  amLODipine (NORVASC) 5 MG tablet Take 1 tablet by mouth once daily 12/12/21   Glendale Chard, MD  aspirin EC 81 MG tablet Take 81 mg by mouth daily.    [provider]  atorvastatin (LIPITOR) 40 MG tablet Take one tab MWF and Saturdays 02/23/22   Glendale Chard, MD  Blood Glucose Monitoring Suppl (ONE TOUCH ULTRA 2) w/Device KIT Use to check blood sugar 3 times a day.  Dx code e11.65 02/27/20   Glendale Chard, MD  Carboxymethylcellul-Glycerin (LUBRICATING EYE DROPS OP) Place 1 drop into both eyes 2 (two) times daily.    [provider]  hydrochlorothiazide (HYDRODIURIL) 25 MG tablet Take 1 tablet by mouth once daily 12/12/21   Glendale Chard, MD  Magnesium 250 MG TABS otc 03/30/19   Glendale Chard, MD  metFORMIN (GLUCOPHAGE-XR) 500 MG 24 hr tablet Take 1 tablet by mouth twice daily 12/12/21   Glendale Chard, MD  olmesartan [facility]) 40 MG tablet Take 1  tablet by mouth once daily 12/12/21   Glendale Chard, MD  OneTouch Delica Lancets 54U MISC USE AS DIRECTED TO CHECK BLOOD SUGARS ONCE DAILY 05/05/21   Glendale Chard, MD  [facility] ULTRA test strip USE AS INSTRUCTED TO CHECK BLOOD SUGARS ONE TIME PER DAY 05/05/21   Glendale Chard, MD  sertraline (ZOLOFT) 50 MG tablet Take 1 tablet (50 mg total) by mouth daily. 02/23/22 02/23/23  Glendale Chard, MD  SYNTHROID 112 MCG tablet TAKE 1 TABLET MONDAY THROUGH SATURDAY AND 1/2 TABLET ON SUNDAY 01/06/22   Glendale Chard, MD    Family History Family History  Problem Relation Age of Onset   Pancreatic cancer Mother    Heart failure Father    Diabetes Brother    Breast cancer Cousin     Social History Social History   Tobacco Use   Smoking status: Never   Smokeless tobacco: Never  Vaping Use   Vaping Use: Never used  Substance Use Topics   Alcohol use: Not Currently    Alcohol/week: 0.0 standard drinks of alcohol    Comment: rarely   Drug use: No     Allergies   Patient has no known allergies.   Review of Systems Review of Systems Per HPI  Physical Exam Triage Vital Signs ED Triage Vitals [03/01/22 1102]  Enc Vitals Group     BP 129/65     Pulse Rate (!) 56     Resp 17     Temp 97.7 F (36.5 C)     Temp src      SpO2 97 %     Weight      Height      Head Circumference      Peak Flow      Pain Score 0     Pain Loc      Pain Edu?      Excl. in [place]?    No data found.  Updated Vital Signs BP 129/65   Pulse (!) 56   Temp 97.7 F (36.5 C)   Resp 17   SpO2 97%   Visual Acuity Right Eye Distance:   Left Eye Distance:   Bilateral Distance:    Right Eye Near:   Left Eye Near:    Bilateral Near:     Physical Exam Constitutional:      General: She is not in acute distress.    Appearance: Normal appearance. She is not toxic-appearing or diaphoretic.  HENT:     Head: Normocephalic and atraumatic.  Eyes:     Extraocular Movements: Extraocular movements intact.      Conjunctiva/sclera: Conjunctivae normal.     Pupils: Pupils are equal, round, and reactive to light.  Cardiovascular:     Rate and Rhythm: Normal rate and regular rhythm.     Pulses: Normal pulses.     Heart sounds: Normal heart sounds.  Pulmonary:     Effort: Pulmonary effort is normal. No respiratory distress.  Breath sounds: Normal breath sounds.  Neurological:     General: No focal deficit present.     Mental Status: She is alert and oriented to person, place, and time. Mental status is at baseline.     Cranial Nerves: Cranial nerves 2-12 are intact.     Sensory: Sensation is intact.     Motor: Motor function is intact.     Coordination: Coordination is intact.     Gait: Gait is intact.  Psychiatric:        Mood and Affect: Mood normal.        Behavior: Behavior normal.        Thought Content: Thought content normal.        Judgment: Judgment normal.      UC Treatments / Results  Labs (all labs ordered are listed, but only abnormal results are displayed) Labs Reviewed  POCT FASTING CBG [facility] MANUAL ENTRY - Abnormal; Notable for the following components:      Result Value   POCT Glucose ([facility]) 152 (*)    All other components within normal limits    EKG   Radiology No results found.  Procedures Procedures (including critical care time)  Medications Ordered in UC Medications - No data to display  Initial Impression / Assessment and Plan / UC Course  I have reviewed the triage vital signs and the nursing notes.  Pertinent labs & imaging results that were available during my care of the patient were reviewed by me and considered in my medical decision making (see chart for details).     EKG showing sinus bradycardia with first-degree AV block which is changed from EKG that was completed at PCP a few days prior.  Given patient's episode of associated symptoms and possible near syncope, I do think this requires further evaluation and management at the emergency  department given limited resources here in urgent care.  Advised patient to go to the hospital for further evaluation and management and patient was agreeable with plan.  Offered and suggested EMS transport but patient declined stating that she wished for her husband to transport her. Vitals signsand neuro exam stable at discharge and patient does not have any current symptoms.  Patient left via self transport. Final Clinical Impressions(s) / UC Diagnoses   Final diagnoses:  Dizziness and giddiness     Discharge Instructions      Go to the emergency department as soon as you leave urgent care for further evaluation and management.    ED Prescriptions   None    PDMP not reviewed this encounter.   Teodora Medici, [place] 03/01/22 1148

## 2022-03-01 NOTE — ED Notes (Signed)
Discharge paperwork given and verbally understood. 

## 2022-03-01 NOTE — ED Notes (Signed)
Patient is being discharged from the Urgent Care and sent to the Emergency Department via self . Per Hildred Alamin, patient is in need of higher level of care due to dizziness. Patient is aware and verbalizes understanding of plan of care.  Vitals:   03/01/22 1102  BP: 129/65  Pulse: (!) 56  Resp: 17  Temp: 97.7 F (36.5 C)  SpO2: 97%

## 2022-03-02 ENCOUNTER — Telehealth: Payer: Self-pay

## 2022-03-02 NOTE — Telephone Encounter (Signed)
Transition Care Management Follow-up Telephone Call Date of discharge and from where: 03/01/2022 [facility] How have you been since you were released from the hospital? Pt states she feels okay today Any questions or concerns? No  Items Reviewed: Did the pt receive and understand the discharge instructions provided? Yes  Medications obtained and verified? Yes  Other? No  Any new allergies since your discharge? No  Dietary orders reviewed? Yes Do you have support at home? Yes   Home Care and Equipment/Supplies: Were home health services ordered? no If so, what is the name of the agency? N/a  Has the agency set up a time to come to the patient's home? no Were any new equipment or medical supplies ordered?  No What is the name of the medical supply agency? N/a Were you able to get the supplies/equipment? no Do you have any questions related to the use of the equipment or supplies? No  Functional Questionnaire: (I = Independent and D = Dependent) ADLs: i  Bathing/Dressing- i  Meal Prep- i  Eating- i  Maintaining continence- i  Transferring/Ambulation- i  Managing Meds- i  Follow up appointments reviewed:  PCP Hospital f/u appt confirmed? Yes  Scheduled to see robyn sanders on Tuesday 10/10 @ [facility]. [place] [facility] f/u appt confirmed? No  Scheduled to see n/a on n/a @ n/a. Are transportation arrangements needed? No  If their condition worsens, is the pt aware to call PCP or go to the Emergency Dept.? Yes Was the patient provided with contact information for the PCP's office or ED? Yes Was to pt encouraged to call back with questions or concerns? Yes

## 2022-03-03 ENCOUNTER — Ambulatory Visit (INDEPENDENT_AMBULATORY_CARE_PROVIDER_SITE_OTHER): Payer: PPO | Admitting: Internal Medicine

## 2022-03-03 VITALS — BP 120/70 | HR 60 | Temp 98.1°F | Ht 65.0 in | Wt 149.0 lb

## 2022-03-03 DIAGNOSIS — R55 Syncope and collapse: Secondary | ICD-10-CM | POA: Diagnosis not present

## 2022-03-03 DIAGNOSIS — Z09 Encounter for follow-up examination after completed treatment for conditions other than malignant neoplasm: Secondary | ICD-10-CM

## 2022-03-03 NOTE — Progress Notes (Unsigned)
Barnet Glasgow Martin,acting as a Education administrator for [name], MD.,have documented all relevant documentation on the behalf of [name], MD,as directed by  [name], MD while in the presence of [name], MD.    Subjective:     Patient ID: Brooke Brown , female    DOB: 11-30-1950 , 71 y.o.   MRN: [id number]   Chief Complaint  Patient presents with  . Follow-up    ER    HPI  Patient presents today for a ER follow up, patient went to ER on 03/01/2022 didn't stay over night. Patient states on Sunday 03/01/2022 she was in Sunday school and started not feeling well after about 45 minutes.  All of a sudden, she had this feeling come over her. She saw the room get dark, felt like she was about to pass out. Husband states she did not lose consciousness. However, she does not recall going from the sanctuary to the nurse's office.  She was then taken to Urgent Care, evaluated and then sent to [facility] because of EKG changes. She denies having chest pain; however, states she had some discomfort once she panicked.    BP Readings from Last 3 Encounters: 03/03/22 : 120/70 03/01/22 : 115/60 03/01/22 : 129/65      Past Medical History:  Diagnosis Date  . Anxiety   . Arthritis   . Diabetes mellitus   . Grave's disease   . Grave's disease   . Hyperlipemia   . Hypertension      Family History  Problem Relation Age of Onset  . Pancreatic cancer Mother   . Heart failure Father   . Diabetes Brother   . Breast cancer Cousin      Current Outpatient Medications:  .  acetaminophen (TYLENOL) 500 MG tablet, Take 500 mg by mouth daily as needed for moderate pain or headache., Disp: , Rfl:  .  amLODipine (NORVASC) 5 MG tablet, Take 1 tablet by mouth once daily, Disp: 90 tablet, Rfl: 0 .  aspirin EC 81 MG tablet, Take 81 mg by mouth daily., Disp: , Rfl:  .  atorvastatin (LIPITOR) 40 MG tablet, Take one tab MWF and Saturdays, Disp: 48 tablet, Rfl: 3 .  Blood Glucose  Monitoring Suppl (ONE TOUCH ULTRA 2) w/Device KIT, Use to check blood sugar 3 times a day. Dx code e11.65, Disp: 1 kit, Rfl: 2 .  Carboxymethylcellul-Glycerin (LUBRICATING EYE DROPS OP), Place 1 drop into both eyes 2 (two) times daily., Disp: , Rfl:  .  hydrochlorothiazide (HYDRODIURIL) 25 MG tablet, Take 1 tablet by mouth once daily, Disp: 90 tablet, Rfl: 0 .  Magnesium 250 MG TABS, otc, Disp:  , Rfl: 0 .  metFORMIN (GLUCOPHAGE-XR) 500 MG 24 hr tablet, Take 1 tablet by mouth twice daily, Disp: 180 tablet, Rfl: 0 .  olmesartan (BENICAR) 40 MG tablet, Take 1 tablet by mouth once daily, Disp: 90 tablet, Rfl: 0 .  OneTouch Delica Lancets 68L MISC, USE AS DIRECTED TO CHECK BLOOD SUGARS ONCE DAILY, Disp: 100 each, Rfl: 2 .  ONETOUCH ULTRA test strip, USE AS INSTRUCTED TO CHECK BLOOD SUGARS ONE TIME PER DAY, Disp: 100 each, Rfl: 2 .  sertraline (ZOLOFT) 50 MG tablet, Take 1 tablet (50 mg total) by mouth daily., Disp: 90 tablet, Rfl: 2 .  SYNTHROID 112 MCG tablet, TAKE 1 TABLET MONDAY THROUGH SATURDAY AND 1/2 TABLET ON SUNDAY, Disp: 90 tablet, Rfl: 1   No Known Allergies   Review of Systems  Constitutional: Negative.   HENT: Negative.    Eyes: Negative.   Respiratory: Negative.    Cardiovascular: Negative.   Gastrointestinal: Negative.      Today's Vitals   03/03/22 1423  BP: 120/70  Pulse: 60  Temp: 98.1 F (36.7 C)  TempSrc: Oral  Weight: 149 lb (67.6 kg)  Height: 5' 5"  (1.651 m)  PainSc: 0-No pain   Body mass index is 24.79 kg/m.  BP Readings from Last 3 Encounters:  03/03/22 120/70  03/01/22 115/60  03/01/22 129/65    Objective:  Physical Exam Vitals and nursing note reviewed.  Constitutional:      Appearance: Normal appearance.  HENT:     Head: Normocephalic and atraumatic.  Eyes:     Extraocular Movements: Extraocular movements intact.  Cardiovascular:     Rate and Rhythm: Normal rate and regular rhythm.     Heart sounds: Normal heart sounds.  Pulmonary:      Effort: Pulmonary effort is normal.     Breath sounds: Normal breath sounds.  Musculoskeletal:     Cervical back: Normal range of motion.  Skin:    General: Skin is warm.  Neurological:     General: No focal deficit present.     Mental Status: She is alert.  Psychiatric:        Mood and Affect: Mood normal.        Behavior: Behavior normal.        Assessment And Plan:     1. Near syncope  2. Hospital discharge follow-up     Patient was given opportunity to ask questions. Patient verbalized understanding of the plan and was able to repeat key elements of the plan. All questions were answered to their satisfaction.   I, [name], MD, have reviewed all documentation for this visit. The documentation on 03/03/22 for the exam, diagnosis, procedures, and orders are all accurate and complete.   IF YOU HAVE BEEN REFERRED TO A SPECIALIST, IT MAY TAKE 1-2 WEEKS TO SCHEDULE/PROCESS THE REFERRAL. IF YOU HAVE NOT HEARD FROM US/SPECIALIST IN TWO WEEKS, PLEASE GIVE [place] A CALL AT [phone].   THE PATIENT IS ENCOURAGED TO PRACTICE SOCIAL DISTANCING DUE TO THE COVID-19 PANDEMIC.

## 2022-03-03 NOTE — Patient Instructions (Signed)

## 2022-03-06 ENCOUNTER — Telehealth: Payer: Self-pay

## 2022-03-06 DIAGNOSIS — E1142 Type 2 diabetes mellitus with diabetic polyneuropathy: Secondary | ICD-10-CM | POA: Diagnosis not present

## 2022-03-06 DIAGNOSIS — F419 Anxiety disorder, unspecified: Secondary | ICD-10-CM | POA: Diagnosis not present

## 2022-03-06 DIAGNOSIS — E785 Hyperlipidemia, unspecified: Secondary | ICD-10-CM | POA: Diagnosis not present

## 2022-03-06 DIAGNOSIS — M1990 Unspecified osteoarthritis, unspecified site: Secondary | ICD-10-CM | POA: Diagnosis not present

## 2022-03-06 DIAGNOSIS — E1169 Type 2 diabetes mellitus with other specified complication: Secondary | ICD-10-CM | POA: Diagnosis not present

## 2022-03-06 DIAGNOSIS — I10 Essential (primary) hypertension: Secondary | ICD-10-CM | POA: Diagnosis not present

## 2022-03-06 DIAGNOSIS — Z7982 Long term (current) use of aspirin: Secondary | ICD-10-CM | POA: Diagnosis not present

## 2022-03-06 DIAGNOSIS — F411 Generalized anxiety disorder: Secondary | ICD-10-CM | POA: Diagnosis not present

## 2022-03-06 DIAGNOSIS — G8929 Other chronic pain: Secondary | ICD-10-CM | POA: Diagnosis not present

## 2022-03-06 DIAGNOSIS — E1122 Type 2 diabetes mellitus with diabetic chronic kidney disease: Secondary | ICD-10-CM | POA: Diagnosis not present

## 2022-03-06 DIAGNOSIS — E039 Hypothyroidism, unspecified: Secondary | ICD-10-CM | POA: Diagnosis not present

## 2022-03-06 DIAGNOSIS — E1165 Type 2 diabetes mellitus with hyperglycemia: Secondary | ICD-10-CM | POA: Diagnosis not present

## 2022-03-06 NOTE — Telephone Encounter (Signed)
        Patient  visited [facility] on 10/8     Telephone encounter attempt :  1st  A HIPAA compliant voice message was left requesting a return call.  Instructed patient to call back .    [place], Care Management  [phone] [address], [place], [place] [address] Phone: [phone] Email: [email or web address]

## 2022-03-08 NOTE — Progress Notes (Unsigned)
No chief complaint on file.     History of Present Illness: 71 yo female with history of anxiety, DM, HTN, HLD, Grave's disease and arthritis who is here today as a new consult, referred by *** , for the evaluation of chest pain. She was seen in the ED at [facility] 03/01/22 with chest pain. EKG without ischemic changes. Troponin negative x 2. She tells me today that she ***  Primary Care Physician: Dorothyann Peng, MD   Past Medical History:  Diagnosis Date   Anxiety    Arthritis    Diabetes mellitus    Grave's disease    Grave's disease    Hyperlipemia    Hypertension     Past Surgical History:  Procedure Laterality Date   ABDOMINAL HYSTERECTOMY     CATARACT EXTRACTION W/ INTRAOCULAR LENS  IMPLANT, BILATERAL Bilateral    CHOLECYSTECTOMY N/A 08/16/2017   Procedure: LAPAROSCOPIC CHOLECYSTECTOMY;  Surgeon: Jimmye Norman, MD;  Location: [facility] OR;  Service: General;  Laterality: N/A;   TUBAL LIGATION      Current Outpatient Medications  Medication Sig Dispense Refill   acetaminophen (TYLENOL) 500 MG tablet Take 500 mg by mouth daily as needed for moderate pain or headache.     amLODipine (NORVASC) 5 MG tablet Take 1 tablet by mouth once daily 90 tablet 0   aspirin EC 81 MG tablet Take 81 mg by mouth daily.     atorvastatin (LIPITOR) 40 MG tablet Take one tab MWF and Saturdays 48 tablet 3   Blood Glucose Monitoring Suppl (ONE TOUCH ULTRA 2) w/Device KIT Use to check blood sugar 3 times a day. Dx code e11.65 1 kit 2   Carboxymethylcellul-Glycerin (LUBRICATING EYE DROPS OP) Place 1 drop into both eyes 2 (two) times daily.     hydrochlorothiazide (HYDRODIURIL) 25 MG tablet Take 1 tablet by mouth once daily 90 tablet 0   Magnesium 250 MG TABS otc  0   metFORMIN (GLUCOPHAGE-XR) 500 MG 24 hr tablet Take 1 tablet by mouth twice daily 180 tablet 0   olmesartan (BENICAR) 40 MG tablet Take 1 tablet by mouth once daily 90 tablet 0   OneTouch Delica Lancets 30G MISC USE AS DIRECTED TO CHECK BLOOD  SUGARS ONCE DAILY 100 each 2   ONETOUCH ULTRA test strip USE AS INSTRUCTED TO CHECK BLOOD SUGARS ONE TIME PER DAY 100 each 2   sertraline (ZOLOFT) 50 MG tablet Take 1 tablet (50 mg total) by mouth daily. 90 tablet 2   SYNTHROID 112 MCG tablet TAKE 1 TABLET MONDAY THROUGH SATURDAY AND 1/2 TABLET ON SUNDAY 90 tablet 1   No current facility-administered medications for this visit.    No Known Allergies  Social History   Socioeconomic History   Marital status: Married    Spouse name: Not on file   Number of children: Not on file   Years of education: Not on file   Highest education level: Not on file  Occupational History   Occupation: retired  Tobacco Use   Smoking status: Never   Smokeless tobacco: Never  Vaping Use   Vaping Use: Never used  Substance and Sexual Activity   Alcohol use: Not Currently    Alcohol/week: 0.0 standard drinks of alcohol    Comment: rarely   Drug use: No   Sexual activity: Not Currently  Other Topics Concern   Not on file  Social History Narrative   Not on file   Social Determinants of Health   Financial Resource Strain:  Low Risk  (06/12/2021)   Overall Financial Resource Strain (CARDIA)    Difficulty of Paying Living Expenses: Not hard at all  Food Insecurity: No Food Insecurity (08/25/2021)   Hunger Vital Sign    Worried About Running Out of Food in the Last Year: Never true    Ran Out of Food in the Last Year: Never true  Transportation Needs: No Transportation Needs (08/25/2021)   PRAPARE - Hydrologist (Medical): No    Lack of Transportation (Non-Medical): No  Physical Activity: Sufficiently Active (06/12/2021)   Exercise Vital Sign    Days of Exercise per Week: 3 days    Minutes of Exercise per Session: 60 min  Stress: No Stress Concern Present (06/12/2021)   [place]    Feeling of Stress : Only a little  Social Connections: Unknown (08/25/2021)    Social Connection and Isolation Panel [NHANES]    Frequency of Communication with Friends and Family: More than three times a week    Frequency of Social Gatherings with Friends and Family: Twice a week    Attends Religious Services: Not on Advertising copywriter or Organizations: Not on file    Attends Archivist Meetings: Not on file    Marital Status: Not on file  Intimate Partner Violence: Not At Risk (08/03/2018)   Humiliation, Afraid, Rape, and Kick questionnaire    Fear of Current or Ex-Partner: No    Emotionally Abused: No    Physically Abused: No    Sexually Abused: No    Family History  Problem Relation Age of Onset   Pancreatic cancer Mother    Heart failure Father    Diabetes Brother    Breast cancer Cousin     Review of Systems:  As stated in the HPI and otherwise negative.   There were no vitals taken for this visit.  Physical Examination: General: Well developed, well nourished, NAD  HEENT: OP clear, mucus membranes moist  SKIN: warm, dry. No rashes. Neuro: No focal deficits  Musculoskeletal: Muscle strength 5/5 all ext  Psychiatric: Mood and affect normal  Neck: No JVD, no carotid bruits, no thyromegaly, no lymphadenopathy.  Lungs:Clear bilaterally, no wheezes, rhonci, crackles Cardiovascular: Regular rate and rhythm. No murmurs, gallops or rubs. Abdomen:Soft. Bowel sounds present. Non-tender.  Extremities: No lower extremity edema. Pulses are 2 + in the bilateral DP/PT.  EKG:  EKG {ACTION; IS/IS [id number]} ordered today. The ekg ordered today demonstrates ***  Recent Labs: 11/19/2021: TSH 1.170 02/23/2022: ALT 14 03/01/2022: BUN 14; Creatinine, Ser 0.83; Hemoglobin 12.5; Platelets 326; Potassium 3.9; Sodium 140   Lipid Panel    Component Value Date/Time   CHOL 171 01/08/2022 1252   TRIG 119 01/08/2022 1252   HDL 59 01/08/2022 1252   CHOLHDL 2.9 01/08/2022 1252   LDLCALC 91 01/08/2022 1252     Wt Readings from Last 3  Encounters:  03/03/22 149 lb (67.6 kg)  03/01/22 149 lb 4 oz (67.7 kg)  02/23/22 149 lb 3.2 oz (67.7 kg)      Assessment and Plan:   1.   Labs/ tests ordered today include:  No orders of the defined types were placed in this encounter.    Disposition:   F/U with me in ***    Signed, Lauree Chandler, MD, [facility] 03/08/2022 1:10 PM    [place], [place]  [address] Phone: [phone]; Fax: [phone]    

## 2022-03-09 ENCOUNTER — Encounter: Payer: Self-pay | Admitting: Cardiovascular Disease

## 2022-03-09 ENCOUNTER — Ambulatory Visit: Payer: PPO | Attending: Cardiovascular Disease | Admitting: Cardiovascular Disease

## 2022-03-09 VITALS — BP 130/72 | HR 69 | Ht 65.0 in | Wt 150.8 lb

## 2022-03-09 DIAGNOSIS — R55 Syncope and collapse: Secondary | ICD-10-CM

## 2022-03-09 NOTE — Patient Instructions (Signed)
Medication Instructions:  No changes *If you need a refill on your cardiac medications before your next appointment, please call your pharmacy*   Lab Work: none If you have labs (blood work) drawn today and your tests are completely normal, you will receive your results only by: MyChart Message (if you have MyChart) OR A paper copy in the mail If you have any lab test that is abnormal or we need to change your treatment, we will call you to review the results.   Testing/Procedures: Your physician has requested that you have an echocardiogram. Echocardiography is a painless test that uses sound waves to create images of your heart. It provides your doctor with information about the size and shape of your heart and how well your heart's chambers and valves are working. This procedure takes approximately one hour. There are no restrictions for this procedure. Please do NOT wear cologne, perfume, aftershave, or lotions (deodorant is allowed). Please arrive 15 minutes prior to your appointment time.    Follow-Up: As needed  Important Information About Sugar       

## 2022-03-15 ENCOUNTER — Encounter: Payer: Self-pay | Admitting: Internal Medicine

## 2022-03-16 ENCOUNTER — Other Ambulatory Visit: Payer: Self-pay | Admitting: Internal Medicine

## 2022-03-19 ENCOUNTER — Other Ambulatory Visit: Payer: Self-pay | Admitting: Internal Medicine

## 2022-03-20 ENCOUNTER — Ambulatory Visit (HOSPITAL_COMMUNITY): Payer: PPO | Attending: Cardiovascular Disease

## 2022-03-20 DIAGNOSIS — R55 Syncope and collapse: Secondary | ICD-10-CM

## 2022-03-20 LAB — ECHOCARDIOGRAM COMPLETE
Area-P 1/2: 2.78 cm2
S' Lateral: 2.6 cm

## 2022-03-23 ENCOUNTER — Ambulatory Visit: Payer: PPO | Admitting: Cardiovascular Disease

## 2022-03-27 ENCOUNTER — Other Ambulatory Visit: Payer: Self-pay | Admitting: Internal Medicine

## 2022-04-07 ENCOUNTER — Ambulatory Visit (INDEPENDENT_AMBULATORY_CARE_PROVIDER_SITE_OTHER): Payer: PPO | Admitting: Internal Medicine

## 2022-04-07 ENCOUNTER — Encounter: Payer: Self-pay | Admitting: Internal Medicine

## 2022-04-07 VITALS — BP 120/74 | HR 66 | Temp 98.2°F | Ht 64.8 in | Wt 148.8 lb

## 2022-04-07 DIAGNOSIS — Z6824 Body mass index (BMI) 24.0-24.9, adult: Secondary | ICD-10-CM

## 2022-04-07 DIAGNOSIS — F411 Generalized anxiety disorder: Secondary | ICD-10-CM

## 2022-04-07 DIAGNOSIS — Z23 Encounter for immunization: Secondary | ICD-10-CM

## 2022-04-07 MED ORDER — AMLODIPINE BESYLATE 5 MG PO TABS: 5.0000 mg | ORAL_TABLET | Freq: Every day | ORAL | 2 refills | Status: DC

## 2022-04-07 NOTE — Progress Notes (Signed)
Rich Brave [place]leton,acting as a Education administrator for [name], MD.,have documented all relevant documentation on the behalf of [name], MD,as directed by  [name], MD while in the presence of [name], MD.    Subjective:     Patient ID: Brooke Brown , female    DOB: 06/22/1950 , 71 y.o.   MRN: [id number]   Chief Complaint  Patient presents with   Anxiety    HPI  Patient presents today for med f/u on sertraline. Patient states she has not had any issues with the medication. She has noticed more sound sleep. She has also noticed her anxiety has lessened, although it is still present. She has no new concerns at this time.      Past Medical History:  Diagnosis Date   Anxiety    Arthritis    Diabetes mellitus    Grave's disease    Grave's disease    Hyperlipemia    Hypertension      Family History  Problem Relation Age of Onset   Pancreatic cancer Mother    Heart failure Father    Diabetes Brother    Breast cancer Cousin      Current Outpatient Medications:    acetaminophen (TYLENOL) 500 MG tablet, Take 500 mg by mouth daily as needed for moderate pain or headache., Disp: , Rfl:    aspirin EC 81 MG tablet, Take 81 mg by mouth daily., Disp: , Rfl:    atorvastatin (LIPITOR) 40 MG tablet, Take one tab MWF and Saturdays, Disp: 48 tablet, Rfl: 3   Blood Glucose Monitoring Suppl (ONE TOUCH ULTRA 2) w/Device KIT, Use to check blood sugar 3 times a day. Dx code e11.65, Disp: 1 kit, Rfl: 2   Carboxymethylcellul-Glycerin (LUBRICATING EYE DROPS OP), Place 1 drop into both eyes 2 (two) times daily., Disp: , Rfl:    hydrochlorothiazide (HYDRODIURIL) 25 MG tablet, Take 1 tablet by mouth once daily, Disp: 90 tablet, Rfl: 0   Magnesium 250 MG TABS, otc (Patient taking differently: Take 250 mg by mouth daily. otc), Disp:  , Rfl: 0   metFORMIN (GLUCOPHAGE-XR) 500 MG 24 hr tablet, Take 1 tablet by mouth twice daily, Disp: 180 tablet, Rfl: 0   olmesartan  (BENICAR) 40 MG tablet, Take 1 tablet by mouth once daily, Disp: 90 tablet, Rfl: 0   OneTouch Delica Lancets 03J MISC, USE AS DIRECTED TO CHECK BLOOD SUGARS ONCE DAILY, Disp: 100 each, Rfl: 2   ONETOUCH ULTRA test strip, USE AS INSTRUCTED TO CHECK BLOOD SUGARS ONE TIME PER DAY, Disp: 100 each, Rfl: 2   sertraline (ZOLOFT) 50 MG tablet, Take 1 tablet (50 mg total) by mouth daily., Disp: 90 tablet, Rfl: 2   SYNTHROID 112 MCG tablet, TAKE 1 TABLET MONDAY THROUGH SATURDAY AND 1/2 TABLET ON SUNDAY, Disp: 90 tablet, Rfl: 1   amLODipine (NORVASC) 5 MG tablet, Take 1 tablet (5 mg total) by mouth daily., Disp: 90 tablet, Rfl: 2   No Known Allergies   Review of Systems  Constitutional: Negative.   Eyes: Negative.   Respiratory: Negative.    Cardiovascular: Negative.   Gastrointestinal: Negative.   Musculoskeletal: Negative.   Skin: Negative.   Neurological: Negative.   Psychiatric/Behavioral: Negative.       Today's Vitals   04/07/22 1028  BP: 120/74  Pulse: 66  Temp: 98.2 F (36.8 C)  Weight: 148 lb 12.8 oz (67.5 kg)  Height: 5' 4.8" (1.646 m)  PainSc: 0-No pain  Body mass index is 24.91 kg/m.  Wt Readings from Last 3 Encounters:  04/07/22 148 lb 12.8 oz (67.5 kg)  03/09/22 150 lb 12.8 oz (68.4 kg)  03/03/22 149 lb (67.6 kg)     Objective:  Physical Exam Vitals and nursing note reviewed.  Constitutional:      Appearance: Normal appearance.  HENT:     Head: Normocephalic and atraumatic.     Nose:     Comments: Masked     Mouth/Throat:     Comments: Masked  Eyes:     Extraocular Movements: Extraocular movements intact.  Cardiovascular:     Rate and Rhythm: Normal rate and regular rhythm.     Heart sounds: Normal heart sounds.  Pulmonary:     Effort: Pulmonary effort is normal.     Breath sounds: Normal breath sounds.  Musculoskeletal:     Cervical back: Normal range of motion.  Skin:    General: Skin is warm.  Neurological:     General: No focal deficit present.      Mental Status: She is alert.  Psychiatric:        Mood and Affect: Mood normal.        Behavior: Behavior normal.       Assessment And Plan:     1. Generalized anxiety disorder Comments: Improved with use of sertraline, 57m daily. She has refills, f/u in Feb 2024.  2. BMI 24.0-24.9, adult Comments: She is encouraged to continue with her regular exercise regimen.  3. Immunization due - Zoster Recombinant (Shingrix )   Patient was given opportunity to ask questions. Patient verbalized understanding of the plan and was able to repeat key elements of the plan. All questions were answered to their satisfaction.   I, RMaximino Greenland MD, have reviewed all documentation for this visit. The documentation on 04/07/22 for the exam, diagnosis, procedures, and orders are all accurate and complete.   IF YOU HAVE BEEN REFERRED TO A SPECIALIST, IT MAY TAKE 1-2 WEEKS TO SCHEDULE/PROCESS THE REFERRAL. IF YOU HAVE NOT HEARD FROM US/SPECIALIST IN TWO WEEKS, PLEASE GIVE [place] CALL AT [phone] X [phone].   THE PATIENT IS ENCOURAGED TO PRACTICE SOCIAL DISTANCING DUE TO THE COVID-19 PANDEMIC.

## 2022-04-20 ENCOUNTER — Other Ambulatory Visit: Payer: Self-pay

## 2022-04-20 MED ORDER — SYNTHROID 112 MCG PO TABS: ORAL_TABLET | ORAL | 1 refills | Status: DC

## 2022-04-30 DIAGNOSIS — H9192 Unspecified hearing loss, left ear: Secondary | ICD-10-CM | POA: Diagnosis not present

## 2022-04-30 DIAGNOSIS — H9313 Tinnitus, bilateral: Secondary | ICD-10-CM | POA: Diagnosis not present

## 2022-04-30 DIAGNOSIS — H9042 Sensorineural hearing loss, unilateral, left ear, with unrestricted hearing on the contralateral side: Secondary | ICD-10-CM | POA: Diagnosis not present

## 2022-06-18 ENCOUNTER — Ambulatory Visit (INDEPENDENT_AMBULATORY_CARE_PROVIDER_SITE_OTHER): Payer: PPO

## 2022-06-18 VITALS — BP 122/62 | HR 81 | Temp 98.2°F | Ht 65.0 in | Wt 146.6 lb

## 2022-06-18 DIAGNOSIS — Z0000 Encounter for general adult medical examination without abnormal findings: Secondary | ICD-10-CM | POA: Diagnosis not present

## 2022-06-18 DIAGNOSIS — Z23 Encounter for immunization: Secondary | ICD-10-CM

## 2022-06-18 NOTE — Progress Notes (Signed)
Shingrix administered today. Was billed through [employer] with patient consent.  Subjective:   Brooke Brown is a 72 y.o. female who presents for Medicare Annual (Subsequent) preventive examination.  Review of Systems     Cardiac Risk Factors include: advanced age (>66mn, >>33women);diabetes mellitus;hypertension     Objective:    Today's Vitals   06/18/22 1151 06/18/22 1155  BP: 122/62   Pulse: 81   Temp: 98.2 F (36.8 C)   TempSrc: Oral   SpO2: 99%   Weight: 146 lb 9.6 oz (66.5 kg)   Height: '5\' 5"'$  (1.651 m)   PainSc:  5    Body mass index is 24.4 kg/m.     06/18/2022   12:00 PM 03/01/2022   12:22 PM 06/12/2021    9:52 AM 05/28/2021    9:09 AM 08/29/2020    3:31 PM 08/10/2019   10:08 AM 08/03/2018    3:10 PM  Advanced Directives  Does Patient Have a Medical Advance Directive? No Yes No No Yes No Yes  Type of Advance Directive  Living will   Living will  Living will  Would patient like information on creating a medical advance directive? No - Patient declined   Yes (MAU/Ambulatory/Procedural Areas - Information given)  No - Patient declined     Current Medications (verified) Outpatient Encounter Medications as of 06/18/2022  Medication Sig   acetaminophen (TYLENOL) 500 MG tablet Take 500 mg by mouth daily as needed for moderate pain or headache.   amLODipine (NORVASC) 5 MG tablet Take 1 tablet (5 mg total) by mouth daily.   aspirin EC 81 MG tablet Take 81 mg by mouth daily.   atorvastatin (LIPITOR) 40 MG tablet Take one tab MWF and Saturdays   Blood Glucose Monitoring Suppl (ONE TOUCH ULTRA 2) w/Device KIT Use to check blood sugar 3 times a day. Dx code e11.65   Carboxymethylcellul-Glycerin (LUBRICATING EYE DROPS OP) Place 1 drop into both eyes 2 (two) times daily.   hydrochlorothiazide (HYDRODIURIL) 25 MG tablet Take 1 tablet by mouth once daily   Magnesium 250 MG TABS otc (Patient taking differently: Take 250 mg by mouth daily. otc)   metFORMIN  (GLUCOPHAGE-XR) 500 MG 24 hr tablet Take 1 tablet by mouth twice daily   olmesartan (BENICAR) 40 MG tablet Take 1 tablet by mouth once daily   OneTouch Delica Lancets 329UMISC USE AS DIRECTED TO CHECK BLOOD SUGARS ONCE DAILY   ONETOUCH ULTRA test strip USE AS INSTRUCTED TO CHECK BLOOD SUGARS ONE TIME PER DAY   sertraline (ZOLOFT) 50 MG tablet Take 1 tablet (50 mg total) by mouth daily.   SYNTHROID 112 MCG tablet TAKE 1 TABLET MONDAY THROUGH SATURDAY AND 1/2 TABLET ON SUNDAY   No facility-administered encounter medications on file as of 06/18/2022.    Allergies (verified) Patient has no known allergies.   History: Past Medical History:  Diagnosis Date   Anxiety    Arthritis    Diabetes mellitus    Grave's disease    Grave's disease    Hyperlipemia    Hypertension    Past Surgical History:  Procedure Laterality Date   ABDOMINAL HYSTERECTOMY     CATARACT EXTRACTION W/ INTRAOCULAR LENS  IMPLANT, BILATERAL Bilateral    CHOLECYSTECTOMY N/A 08/16/2017   Procedure: LAPAROSCOPIC CHOLECYSTECTOMY;  Surgeon: WJudeth Horn MD;  [facility];  Service: General;  Laterality: N/A;   TUBAL LIGATION     Family History  Problem Relation Age of Onset   Pancreatic  cancer Mother    Heart failure Father    Diabetes Brother    Breast cancer Cousin    Social History   Socioeconomic History   Marital status: Married    Spouse name: Not on file   Number of children: 1   Years of education: Not on file   Highest education level: Not on file  Occupational History   Occupation: retired   Occupation: Therapist, art  Tobacco Use   Smoking status: Never   Smokeless tobacco: Never  Scientific laboratory technician Use: Never used  Substance and Sexual Activity   Alcohol use: Not Currently    Alcohol/week: 0.0 standard drinks of alcohol    Comment: rarely   Drug use: No   Sexual activity: Not Currently  Other Topics Concern   Not on file  Social History Narrative   Not on file    Social Determinants of Health   Financial Resource Strain: Low Risk  (06/18/2022)   Overall Financial Resource Strain (CARDIA)    Difficulty of Paying Living Expenses: Not hard at all  Food Insecurity: No Food Insecurity (06/18/2022)   Hunger Vital Sign    Worried About Running Out of Food in the Last Year: Never true    [place] in the Last Year: Never true  Transportation Needs: No Transportation Needs (06/18/2022)   PRAPARE - Hydrologist (Medical): No    Lack of Transportation (Non-Medical): No  Physical Activity: Insufficiently Active (06/18/2022)   Exercise Vital Sign    Days of Exercise per Week: 3 days    Minutes of Exercise per Session: 30 min  Stress: Stress Concern Present (06/18/2022)   [place]    Feeling of Stress : To some extent  Social Connections: Unknown (08/25/2021)   Social Connection and Isolation Panel [NHANES]    Frequency of Communication with Friends and Family: More than three times a week    Frequency of Social Gatherings with Friends and Family: Twice a week    Attends Religious Services: Not on Advertising copywriter or Organizations: Not on file    Attends Archivist Meetings: Not on file    Marital Status: Not on file    Tobacco Counseling Counseling given: Not Answered   Clinical Intake:  Pre-visit preparation completed: Yes  Pain : 0-10 Pain Score: 5  Pain Type: Chronic pain Pain Location: Finger (Comment which one) (thumbs) Pain Orientation: Right, Left Pain Descriptors / Indicators: Aching Pain Onset: More than a month ago Pain Frequency: Constant     Nutritional Status: BMI of 19-24  Normal Nutritional Risks: None Diabetes: Yes  How often do you need to have someone help you when you read instructions, pamphlets, or other written materials from your doctor or pharmacy?: 1 - Never  Diabetic? Yes Nutrition Risk  Assessment:  Has the patient had any N/V/D within the last 2 months?  No  Does the patient have any non-healing wounds?  No  Has the patient had any unintentional weight loss or weight gain?  No   Diabetes:  Is the patient diabetic?  Yes  If diabetic, was a CBG obtained today?  No  Did the patient bring in their glucometer from home?  No  How often do you monitor your CBG's? 3-4 weekly.   Financial Strains and Diabetes Management:  Are you having any financial strains with the device,  your supplies or your medication? No .  Does the patient want to be seen by Chronic Care Management for management of their diabetes?  No  Would the patient like to be referred to a Nutritionist or for Diabetic Management?  No   Diabetic Exams:  Diabetic Eye Exam: Completed 07/29/2021 Diabetic Foot Exam: Completed 02/23/2022   Interpreter Needed?: No  Information entered by :: NAllen LPN   Activities of Daily Living    06/18/2022   12:01 PM 06/11/2022    6:01 PM  In your present state of health, do you have any difficulty performing the following activities:  Hearing? 0 0  Vision? 0 0  Difficulty concentrating or making decisions? 0 0  Walking or climbing stairs? 0 0  Dressing or bathing? 0 0  Doing errands, shopping? 0 0  Preparing Food and eating ? N N  Using the Toilet? N N  In the past six months, have you accidently leaked urine? N N  Do you have problems with loss of bowel control? N N  Managing your Medications? N N  Managing your Finances? N N  Housekeeping or managing your Housekeeping? N N    Patient Care Team: Glendale Chard, MD as PCP - General (Internal Medicine) Burnell Blanks, MD as PCP - Cardiology (Cardiology) Rex Kras, Claudette Stapler, RN as Case Manager Pearson, Sharyn Blitz, [facility] (Pharmacist) [facility]  Indicate any recent Medical Services you may have received from other than [facility] providers in the past year (date may be approximate).      Assessment:   This is a routine wellness examination for [place].  Hearing/Vision screen Vision Screening - Comments:: Regular eye exams, [facility]  Dietary issues and exercise activities discussed: Current Exercise Habits: Home exercise routine, Type of exercise: walking, Time (Minutes): 30, Frequency (Times/Week): 3, Weekly Exercise (Minutes/Week): 90   Goals Addressed             This Visit's Progress    Patient Stated       06/18/2022, wants to get off bp meds and decrease diabetes meds       Depression Screen    06/18/2022   12:01 PM 03/03/2022    2:22 PM 02/23/2022   10:42 AM 01/08/2022   12:00 PM 06/12/2021    9:54 AM 08/29/2020    3:32 PM 12/25/2019   12:15 PM  PHQ 2/9 Scores  PHQ - 2 Score 0 0 0 0 0 0 0    Fall Risk    06/18/2022   12:00 PM 06/11/2022    6:01 PM 03/03/2022    2:22 PM 02/23/2022   10:42 AM 01/08/2022   11:59 AM  Fall Risk   Falls in the past year? 0 0 0 0 0  Number falls in past yr: 0  0 0 0  Injury with Fall? 0  0 0 0  Risk for fall due to : Medication side effect  No Fall Risks No Fall Risks   Follow up Falls prevention discussed;Education provided;Falls evaluation completed  Falls evaluation completed Falls evaluation completed Falls evaluation completed    FALL RISK PREVENTION PERTAINING TO THE HOME:  Any stairs in or around the home? Yes  If so, are there any without handrails? No  Home free of loose throw rugs in walkways, pet beds, electrical cords, etc? Yes  Adequate lighting in your home to reduce risk of falls? Yes   ASSISTIVE DEVICES UTILIZED TO PREVENT FALLS:  Life alert? No  Use of a cane, walker or w/c? No  Grab bars in the bathroom? Yes  Shower chair or bench in shower? Yes  Elevated toilet seat or a handicapped toilet? Yes   TIMED UP AND GO:  Was the test performed? Yes .  Length of time to ambulate 10 feet: 5 sec.   Gait steady and fast without use of assistive device  Cognitive Function:         06/18/2022   12:01 PM 06/12/2021    9:55 AM 08/29/2020    3:33 PM 08/10/2019   10:11 AM 08/03/2018    3:12 PM  6CIT Screen  What Year? 0 points 0 points 0 points 0 points 0 points  What month? 0 points 0 points 0 points 0 points 0 points  What time? 0 points 0 points 0 points 0 points 0 points  Count back from 20 0 points 0 points 0 points 0 points 0 points  Months in reverse 0 points 0 points 0 points 0 points 0 points  Repeat phrase 4 points 0 points 0 points 2 points 0 points  Total Score 4 points 0 points 0 points 2 points 0 points    Immunizations Immunization History  Administered Date(s) Administered   COVID-19, mRNA, vaccine(Comirnaty)12 years and older 03/26/2022   Fluad Quad(high Dose 65+) 02/13/2020, 02/23/2022   Influenza, High Dose Seasonal PF 03/10/2018, 01/31/2019   Influenza-Unspecified 02/27/2021   PFIZER(Purple Top)SARS-COV-2 Vaccination 06/16/2019, 07/07/2019, 03/06/2020, 09/23/2020, 04/03/2021   Pneumococcal Conjugate-13 08/04/2018   Pneumococcal Polysaccharide-23 08/16/2020   Tdap 02/23/2022, 02/23/2022   Zoster Recombinat (Shingrix) 04/07/2022, 06/18/2022    TDAP status: Up to date  Flu Vaccine status: Up to date  Pneumococcal vaccine status: Up to date  Covid-19 vaccine status: Completed vaccines  Qualifies for Shingles Vaccine? Yes   Zostavax completed Yes   Shingrix Completed?:  second dose today  Screening Tests Health Maintenance  Topic Date Due   COVID-19 Vaccine (7 - 2023-24 season) 05/21/2022   OPHTHALMOLOGY EXAM  07/30/2022   HEMOGLOBIN A1C  08/25/2022   Diabetic kidney evaluation - Urine ACR  02/24/2023   FOOT EXAM  02/24/2023   Diabetic kidney evaluation - eGFR measurement  03/02/2023   Medicare Annual Wellness (AWV)  06/19/2023   MAMMOGRAM  02/28/2024   COLONOSCOPY (Pts 45-70yr Insurance coverage will need to be confirmed)  11/08/2028   DTaP/Tdap/Td (3 - Td or Tdap) 02/24/2032   Pneumonia Vaccine 74 Years old  Completed   INFLUENZA  VACCINE  Completed   DEXA SCAN  Completed   Hepatitis C Screening  Completed   Zoster Vaccines- Shingrix  Completed   HPV VACCINES  Aged Out    Health Maintenance  Health Maintenance Due  Topic Date Due   COVID-19 Vaccine (7 - 2023-24 season) 05/21/2022    Colorectal cancer screening: Type of screening: Colonoscopy. Completed 11/09/2018. Repeat every 7 years  Mammogram status: Completed 02/27/2022. Repeat every year  Bone Density status: Completed 02/09/2018.   Lung Cancer Screening: (Low Dose CT Chest recommended if Age 74-80years, 30 pack-year currently smoking OR have quit w/in 15years.) does not qualify.   Lung Cancer Screening Referral: no  Additional Screening:  Hepatitis C Screening: does qualify; Completed 01/15/2012  Vision Screening: Recommended annual ophthalmology exams for early detection of glaucoma and other disorders of the eye. Is the patient up to date with their annual eye exam?  Yes  Who is the provider or what is the name of the office in which the patient  attends annual eye exams? [facility] If pt is not established with a provider, would they like to be referred to a provider to establish care? No .   Dental Screening: Recommended annual dental exams for proper oral hygiene  Community Resource Referral / Chronic Care Management: CRR required this visit?  No   CCM required this visit?  No      Plan:     I have personally reviewed and noted the following in the patient's chart:   Medical and social history Use of alcohol, tobacco or illicit drugs  Current medications and supplements including opioid prescriptions. Patient is not currently taking opioid prescriptions. Functional ability and status Nutritional status Physical activity Advanced directives List of other physicians Hospitalizations, surgeries, and ER visits in previous 12 months Vitals Screenings to include cognitive, depression, and falls Referrals and appointments  In  addition, I have reviewed and discussed with patient certain preventive protocols, quality metrics, and best practice recommendations. A written personalized care plan for preventive services as well as general preventive health recommendations were provided to patient.     Kellie Simmering, LPN   6/86/1683   Nurse Notes: none

## 2022-06-18 NOTE — Patient Instructions (Signed)
Brooke Brown , Thank you for taking time to come for your Medicare Wellness Visit. I appreciate your ongoing commitment to your health goals. Please review the following plan we discussed and let me know if I can assist you in the future.   These are the goals we discussed:  Goals       Manage My Medicine      Timeframe:  Long-Range Goal Priority:  High Start Date:                             Expected End Date:                       Follow Up Date 11/04/2021   In Progress:  - call for medicine refill 2 or 3 days before it runs out - keep a list of all the medicines I take; vitamins and herbals too  -Continue to check your BP and record your readings in a log book   Why is this important?   These steps will help you keep on track with your medicines.   Notes:  -Please call if you have any questions about your medications       Patient Stated (pt-stated)      Wants to be able to get off diabetes medicine      Patient Stated      08/10/2019, wants to continue walking and lose 5 pounds      Patient Stated      08/29/2020, wants to get off cholesterol and diabetes medication      Patient Stated      06/12/2021, wants to cut back eating out and decrease diabetes medication      Patient Stated      06/18/2022, wants to get off bp meds and decrease diabetes meds        This is a list of the screening recommended for you and due dates:  Health Maintenance  Topic Date Due   COVID-19 Vaccine (7 - 2023-24 season) 05/21/2022   Eye exam for diabetics  07/30/2022   Hemoglobin A1C  08/25/2022   Yearly kidney health urinalysis for diabetes  02/24/2023   Complete foot exam   02/24/2023   Yearly kidney function blood test for diabetes  03/02/2023   Medicare Annual Wellness Visit  06/19/2023   Mammogram  02/28/2024   Colon Cancer Screening  11/08/2028   DTaP/Tdap/Td vaccine (3 - Td or Tdap) 02/24/2032   Pneumonia Vaccine  Completed   Flu Shot  Completed   DEXA scan (bone density  measurement)  Completed   Hepatitis C Screening: USPSTF Recommendation to screen - Ages 1-79 yo.  Completed   Zoster (Shingles) Vaccine  Completed   HPV Vaccine  Aged Out    Advanced directives: Advance directive discussed with you today. Even though you declined this today please call our office should you change your mind and we can give you the proper paperwork for you to fill out.  Conditions/risks identified: none  Next appointment: Follow up in one year for your annual wellness visit    Preventive Care 65 Years and Older, Female Preventive care refers to lifestyle choices and visits with your health care provider that can promote health and wellness. What does preventive care include? A yearly physical exam. This is also called an annual well check. Dental exams once or twice a year. Routine eye exams. Ask your health care provider  how often you should have your eyes checked. Personal lifestyle choices, including: Daily care of your teeth and gums. Regular physical activity. Eating a healthy diet. Avoiding tobacco and drug use. Limiting alcohol use. Practicing safe sex. Taking low-dose aspirin every day. Taking vitamin and mineral supplements as recommended by your health care provider. What happens during an annual well check? The services and screenings done by your health care provider during your annual well check will depend on your age, overall health, lifestyle risk factors, and family history of disease. Counseling  Your health care provider may ask you questions about your: Alcohol use. Tobacco use. Drug use. Emotional well-being. Home and relationship well-being. Sexual activity. Eating habits. History of falls. Memory and ability to understand (cognition). Work and work Statistician. Reproductive health. Screening  You may have the following tests or measurements: Height, weight, and BMI. Blood pressure. Lipid and cholesterol levels. These may be  checked every 5 years, or more frequently if you are over 54 years old. Skin check. Lung cancer screening. You may have this screening every year starting at age 107 if you have a 30-pack-year history of smoking and currently smoke or have quit within the past 15 years. Fecal occult blood test (FOBT) of the stool. You may have this test every year starting at age 57. Flexible sigmoidoscopy or colonoscopy. You may have a sigmoidoscopy every 5 years or a colonoscopy every 10 years starting at age 31. Hepatitis C blood test. Hepatitis B blood test. Sexually transmitted disease (STD) testing. Diabetes screening. This is done by checking your blood sugar (glucose) after you have not eaten for a while (fasting). You may have this done every 1-3 years. Bone density scan. This is done to screen for osteoporosis. You may have this done starting at age 77. Mammogram. This may be done every 1-2 years. Talk to your health care provider about how often you should have regular mammograms. Talk with your health care provider about your test results, treatment options, and if necessary, the need for more tests. Vaccines  Your health care provider may recommend certain vaccines, such as: Influenza vaccine. This is recommended every year. Tetanus, diphtheria, and acellular pertussis (Tdap, Td) vaccine. You may need a Td booster every 10 years. Zoster vaccine. You may need this after age 70. Pneumococcal 13-valent conjugate (PCV13) vaccine. One dose is recommended after age 64. Pneumococcal polysaccharide (PPSV23) vaccine. One dose is recommended after age 90. Talk to your health care provider about which screenings and vaccines you need and how often you need them. This information is not intended to replace advice given to you by your health care provider. Make sure you discuss any questions you have with your health care provider. Document Released: 06/07/2015 Document Revised: 01/29/2016 Document Reviewed:  03/12/2015 Elsevier Interactive Patient Education  2017 [place] Prevention in the Home Falls can cause injuries. They can happen to people of all ages. There are many things you can do to make your home safe and to help prevent falls. What can I do on the outside of my home? Regularly fix the edges of walkways and driveways and fix any cracks. Remove anything that might make you trip as you walk through a door, such as a raised step or threshold. Trim any bushes or trees on the path to your home. Use bright outdoor lighting. Clear any walking paths of anything that might make someone trip, such as rocks or tools. Regularly check to see if handrails are loose  or broken. Make sure that both sides of any steps have handrails. Any raised decks and porches should have guardrails on the edges. Have any leaves, snow, or ice cleared regularly. Use sand or salt on walking paths during winter. Clean up any spills in your garage right away. This includes oil or grease spills. What can I do in the bathroom? Use night lights. Install grab bars by the toilet and in the tub and shower. Do not use towel bars as grab bars. Use non-skid mats or decals in the tub or shower. If you need to sit down in the shower, use a plastic, non-slip stool. Keep the floor dry. Clean up any water that spills on the floor as soon as it happens. Remove soap buildup in the tub or shower regularly. Attach bath mats securely with double-sided non-slip rug tape. Do not have throw rugs and other things on the floor that can make you trip. What can I do in the bedroom? Use night lights. Make sure that you have a light by your bed that is easy to reach. Do not use any sheets or blankets that are too big for your bed. They should not hang down onto the floor. Have a firm chair that has side arms. You can use this for support while you get dressed. Do not have throw rugs and other things on the floor that can make you  trip. What can I do in the kitchen? Clean up any spills right away. Avoid walking on wet floors. Keep items that you use a lot in easy-to-reach places. If you need to reach something above you, use a strong step stool that has a grab bar. Keep electrical cords out of the way. Do not use floor polish or wax that makes floors slippery. If you must use wax, use non-skid floor wax. Do not have throw rugs and other things on the floor that can make you trip. What can I do with my stairs? Do not leave any items on the stairs. Make sure that there are handrails on both sides of the stairs and use them. Fix handrails that are broken or loose. Make sure that handrails are as long as the stairways. Check any carpeting to make sure that it is firmly attached to the stairs. Fix any carpet that is loose or worn. Avoid having throw rugs at the top or bottom of the stairs. If you do have throw rugs, attach them to the floor with carpet tape. Make sure that you have a light switch at the top of the stairs and the bottom of the stairs. If you do not have them, ask someone to add them for you. What else can I do to help prevent falls? Wear shoes that: Do not have high heels. Have rubber bottoms. Are comfortable and fit you well. Are closed at the toe. Do not wear sandals. If you use a stepladder: Make sure that it is fully opened. Do not climb a closed stepladder. Make sure that both sides of the stepladder are locked into place. Ask someone to hold it for you, if possible. Clearly mark and make sure that you can see: Any grab bars or handrails. First and last steps. Where the edge of each step is. Use tools that help you move around (mobility aids) if they are needed. These include: Canes. Walkers. Scooters. Crutches. Turn on the lights when you go into a dark area. Replace any light bulbs as soon as they burn out. Set  up your furniture so you have a clear path. Avoid moving your furniture  around. If any of your floors are uneven, fix them. If there are any pets around you, be aware of where they are. Review your medicines with your doctor. Some medicines can make you feel dizzy. This can increase your chance of falling. Ask your doctor what other things that you can do to help prevent falls. This information is not intended to replace advice given to you by your health care provider. Make sure you discuss any questions you have with your health care provider. Document Released: 03/07/2009 Document Revised: 10/17/2015 Document Reviewed: 06/15/2014 Elsevier Interactive Patient Education  2017 [employer].

## 2022-06-27 ENCOUNTER — Other Ambulatory Visit: Payer: Self-pay | Admitting: Internal Medicine

## 2022-06-29 ENCOUNTER — Ambulatory Visit (INDEPENDENT_AMBULATORY_CARE_PROVIDER_SITE_OTHER): Payer: PPO | Admitting: Internal Medicine

## 2022-06-29 ENCOUNTER — Encounter: Payer: Self-pay | Admitting: Internal Medicine

## 2022-06-29 VITALS — BP 118/74 | HR 68 | Temp 98.2°F | Ht 65.0 in | Wt 145.2 lb

## 2022-06-29 DIAGNOSIS — E890 Postprocedural hypothyroidism: Secondary | ICD-10-CM

## 2022-06-29 DIAGNOSIS — E7800 Pure hypercholesterolemia, unspecified: Secondary | ICD-10-CM | POA: Diagnosis not present

## 2022-06-29 DIAGNOSIS — I1310 Hypertensive heart and chronic kidney disease without heart failure, with stage 1 through stage 4 chronic kidney disease, or unspecified chronic kidney disease: Secondary | ICD-10-CM

## 2022-06-29 DIAGNOSIS — N182 Chronic kidney disease, stage 2 (mild): Secondary | ICD-10-CM | POA: Diagnosis not present

## 2022-06-29 DIAGNOSIS — L659 Nonscarring hair loss, unspecified: Secondary | ICD-10-CM

## 2022-06-29 DIAGNOSIS — I700 Atherosclerosis of aorta: Secondary | ICD-10-CM

## 2022-06-29 DIAGNOSIS — E1122 Type 2 diabetes mellitus with diabetic chronic kidney disease: Secondary | ICD-10-CM | POA: Diagnosis not present

## 2022-06-29 DIAGNOSIS — I129 Hypertensive chronic kidney disease with stage 1 through stage 4 chronic kidney disease, or unspecified chronic kidney disease: Secondary | ICD-10-CM | POA: Diagnosis not present

## 2022-06-29 NOTE — Patient Instructions (Addendum)
Vitamin D , take M-F and skip weekends  Type 2 Diabetes Mellitus, Diagnosis, Adult Type 2 diabetes (type 2 diabetes mellitus) is a long-term (chronic) disease. It may happen when there is one or both of these problems: The pancreas does not make enough insulin. The body does not react in a normal way to insulin that it makes. Insulin lets sugars go into cells in your body. If you have type 2 diabetes, sugars cannot get into your cells. Sugars build up in the blood. This causes high blood sugar. What are the causes? The exact cause of this condition is not known. What increases the risk? Having type 2 diabetes in your family. Being overweight or very overweight. Not being active. Your body not reacting in a normal way to the insulin it makes. Having higher than normal blood sugar over time. Having a type of diabetes when you were pregnant. Having a condition that causes small fluid-filled sacs on your ovaries. What are the signs or symptoms? At first, you may have no symptoms. You will get symptoms slowly. They may include: More thirst than normal. More hunger than normal. Needing to pee more than normal. Losing weight without trying. Feeling tired. Feeling weak. Seeing things blurry. Dark patches on your skin. How is this treated? This condition may be treated by a diabetes expert. You may need to: Follow an eating plan made by a food expert (dietitian). Get regular exercise. Find ways to deal with stress. Check blood sugar as often as told. Take medicines. Your doctor will set treatment goals for you. Your blood sugar should be at these levels: Before meals: 80-130 mg/dL (4.4-7.2 mmol/L). After meals: below 180 mg/dL (10 mmol/L). Over the last 2-3 months: less than 7%. Follow these instructions at home: Medicines Take your diabetes medicines or insulin every day. Take medicines as told to help you prevent other problems caused by this condition. You may  need: Aspirin. Medicine to lower cholesterol. Medicine to control blood pressure. Questions to ask your doctor Should I meet with a diabetes educator? What medicines do I need, and when should I take them? What will I need to treat my condition at home? When should I check my blood sugar? Where can I find a support group? Who can I call if I have questions? When is my next doctor visit? General instructions Take over-the-counter and prescription medicines only as told by your doctor. Keep all follow-up visits. Where to find more information For help and guidance and more information about diabetes, please go to: American Diabetes Association (ADA): www.diabetes.org American Association of Diabetes Care and Education Specialists (ADCES): www.diabeteseducator.org International Diabetes Federation (IDF): MemberVerification.ca Contact a doctor if: Your blood sugar is at or above 240 mg/dL (13.3 mmol/L) for 2 days in a row. You have been sick for 2 days or more, and you are not getting better. You have had a fever for 2 days or more, and you are not getting better. You have any of these problems for more than 6 hours: You cannot eat or drink. You feel like you may vomit. You vomit. You have watery poop (diarrhea). Get help right away if: Your blood sugar is lower than 54 mg/dL (3 mmol/L). You feel mixed up (confused). You have trouble thinking clearly. You have trouble breathing. You have medium or large ketone levels in your pee. These symptoms may be an emergency. Get help right away. Call your local emergency services (911 in the [place] Do not wait to see if  the symptoms will go away. Do not drive yourself to the hospital. Summary Type 2 diabetes is a long-term disease. Your pancreas may not make enough insulin, or your body may not react in a normal way to insulin that it makes. This condition is treated with an eating plan, lifestyle changes, and medicines. Your doctor will set  treatment goals for you. These will help you keep your blood sugar in a healthy range. Keep all follow-up visits. This information is not intended to replace advice given to you by your health care provider. Make sure you discuss any questions you have with your health care provider. Document Revised: 08/05/2020 Document Reviewed: 08/05/2020 Elsevier Patient Education  [place].

## 2022-06-29 NOTE — Progress Notes (Signed)
I,[name],acting as a scribe for [name], MD.,have documented all relevant documentation on the behalf of [name], MD,as directed by  [name], MD while in the presence of [name], MD.    Subjective:     Patient ID: Brooke Brown , female    DOB: 1951/01/16 , 72 y.o.   MRN: [id number]   Chief Complaint  Patient presents with   Diabetes   Hypertension    HPI  She presents today for diabetes, blood pressure check. She reports compliance with meds.  She denies headaches, chest pain and shortness of breath. She states her sugars have been doing well. However, she has had sugars below 90 which she felt. She admits food just doesn't taste the same, so her appetite has diminished.   She has a question about Zoloft, she read online that it can cause hair loss. She is noticing some hair loss, she states noticing this once she started medication.   Diabetes She presents for her follow-up diabetic visit. She has type 2 diabetes mellitus. Her disease course has been stable. There are no hypoglycemic associated symptoms. Pertinent negatives for diabetes include no blurred vision, no polydipsia, no polyphagia and no polyuria. There are no hypoglycemic complications. Diabetic complications include nephropathy. Risk factors for coronary artery disease include diabetes mellitus, dyslipidemia, hypertension, post-menopausal and sedentary lifestyle. Her weight is stable. She is following a diabetic diet. She participates in exercise three times a week. Her breakfast blood glucose is taken between 8-9 am. Her breakfast blood glucose range is generally 110-130 mg/dl. Eye exam is current.  Hypertension This is a chronic problem. The current episode started more than 1 year ago. The problem has been gradually improving since onset. The problem is controlled. Pertinent negatives include no blurred vision, neck pain or palpitations.     Past Medical History:   Diagnosis Date   Anxiety    Arthritis    Diabetes mellitus    Grave's disease    Grave's disease    Hyperlipemia    Hypertension      Family History  Problem Relation Age of Onset   Pancreatic cancer Mother    Heart failure Father    Diabetes Brother    Breast cancer Cousin      Current Outpatient Medications:    acetaminophen (TYLENOL) 500 MG tablet, Take 500 mg by mouth daily as needed for moderate pain or headache., Disp: , Rfl:    amLODipine (NORVASC) 5 MG tablet, Take 1 tablet (5 mg total) by mouth daily., Disp: 90 tablet, Rfl: 2   aspirin EC 81 MG tablet, Take 81 mg by mouth daily., Disp: , Rfl:    atorvastatin (LIPITOR) 40 MG tablet, Take one tab MWF and Saturdays, Disp: 48 tablet, Rfl: 3   Blood Glucose Monitoring Suppl (ONE TOUCH ULTRA 2) w/Device KIT, Use to check blood sugar 3 times a day. Dx code e11.65, Disp: 1 kit, Rfl: 2   Carboxymethylcellul-Glycerin (LUBRICATING EYE DROPS OP), Place 1 drop into both eyes 2 (two) times daily., Disp: , Rfl:    Magnesium 250 MG TABS, otc (Patient taking differently: Take 250 mg by mouth daily. otc), Disp:  , Rfl: 0   OneTouch Delica Lancets 47W MISC, USE AS DIRECTED TO CHECK BLOOD SUGARS ONCE DAILY, Disp: 100 each, Rfl: 2   ONETOUCH ULTRA test strip, USE AS INSTRUCTED TO CHECK BLOOD SUGARS ONE TIME PER DAY, Disp: 100 each, Rfl: 2   sertraline (ZOLOFT) 50  MG tablet, Take 1 tablet (50 mg total) by mouth daily., Disp: 90 tablet, Rfl: 2   SYNTHROID 112 MCG tablet, TAKE 1 TABLET MONDAY THROUGH SATURDAY AND 1/2 TABLET ON SUNDAY, Disp: 90 tablet, Rfl: 1   hydrochlorothiazide (HYDRODIURIL) 25 MG tablet, Take 1 tablet by mouth once daily, Disp: 90 tablet, Rfl: 0   metFORMIN (GLUCOPHAGE-XR) 500 MG 24 hr tablet, Take 1 tablet by mouth twice daily, Disp: 180 tablet, Rfl: 0   olmesartan (BENICAR) 40 MG tablet, Take 1 tablet by mouth once daily, Disp: 90 tablet, Rfl: 0   No Known Allergies   Review of Systems  Constitutional: Negative.    Eyes:  Negative for blurred vision.  Respiratory: Negative.    Cardiovascular: Negative.  Negative for palpitations.  Endocrine: Negative for polydipsia, polyphagia and polyuria.  Musculoskeletal:  Negative for neck pain.  Neurological: Negative.   Psychiatric/Behavioral: Negative.       Today's Vitals   06/29/22 0942  BP: 118/74  Pulse: 68  Temp: 98.2 F (36.8 C)  SpO2: 98%  Weight: 145 lb 3.2 oz (65.9 kg)  Height: '5\' 5"'$  (1.651 m)   Body mass index is 24.16 kg/m.  Wt Readings from Last 3 Encounters:  06/29/22 145 lb 3.2 oz (65.9 kg)  06/18/22 146 lb 9.6 oz (66.5 kg)  04/07/22 148 lb 12.8 oz (67.5 kg)      Objective:  Physical Exam Vitals and nursing note reviewed.  Constitutional:      Appearance: Normal appearance.  HENT:     Head: Normocephalic and atraumatic.     Nose:     Comments: Masked     Mouth/Throat:     Comments: Masked  Eyes:     Extraocular Movements: Extraocular movements intact.  Cardiovascular:     Rate and Rhythm: Normal rate and regular rhythm.     Heart sounds: Normal heart sounds.  Pulmonary:     Effort: Pulmonary effort is normal.     Breath sounds: Normal breath sounds.  Musculoskeletal:     Cervical back: Normal range of motion.  Skin:    General: Skin is warm.  Neurological:     General: No focal deficit present.     Mental Status: She is alert.  Psychiatric:        Mood and Affect: Mood normal.        Behavior: Behavior normal.       Assessment And Plan:     1. Type 2 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 disease, without long-term current use of insulin (HCC) Comments: Chronic, I will check labs as below. Due to recent BS <90, I will decrease metformin to one tablet daily. I also discussed use of SGLT2 inhibitor for cardiac/renal protection. I will make further recommendations once her labs are available for review. She is encouraged to eat at regular intervals - she will let me know if sx persist.  - CMP14+EGFR -  Hemoglobin A1c  2. 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 She will continue wiht olmesartan, hctz and amlodipine daily. She is encouraged to follow low sodium diet. - CMP14+EGFR - Hemoglobin A1c  3. Hair loss Comments: Also with hair thinning/increased shedding. Possibly related to sertraline. I will check for anemia and check thyroid levels. I will make further recommendations once labs are available. - TSH - Iron, TIBC and Ferritin Panel - CBC no Diff  4. Atherosclerosis of aorta (HCC) Comments: Chronic, LDL goal <70.  She will c/w ASA '81mg'$  and atorvastatin daily. She is enocuraged to follow a heart healthy diet.  5. Pure hypercholesterolemia Comments: Again, due to underlying DM and Ao Atherosclerosis, LDL goal <70. She will c/w atorvastatin. - CMP14+EGFR - Hemoglobin A1c - TSH  6. Postablative hypothyroidism Comments: Chronic, I will check thyroid panel and adjust meds as needed.     Patient was given opportunity to ask questions. Patient verbalized understanding of the plan and was able to repeat key elements of the plan. All questions were answered to their satisfaction.   I, [name], MD, have reviewed all documentation for this visit. The documentation on 06/29/22 for the exam, diagnosis, procedures, and orders are all accurate and complete.   IF YOU HAVE BEEN REFERRED TO A SPECIALIST, IT MAY TAKE 1-2 WEEKS TO SCHEDULE/PROCESS THE REFERRAL. IF YOU HAVE NOT HEARD FROM US/SPECIALIST IN TWO WEEKS, PLEASE GIVE [place] A CALL AT [phone].   THE PATIENT IS ENCOURAGED TO PRACTICE SOCIAL DISTANCING DUE TO THE COVID-19 PANDEMIC.

## 2022-07-02 LAB — CMP14+EGFR
ALT: 28 IU/L (ref 0–32)
AST: 37 IU/L (ref 0–40)
Albumin/Globulin Ratio: 1.4 (ref 1.2–2.2)
Albumin: 4.4 g/dL (ref 3.8–4.8)
Alkaline Phosphatase: 64 IU/L (ref 44–121)
BUN/Creatinine Ratio: 20 (ref 12–28)
BUN: 14 mg/dL (ref 8–27)
Bilirubin Total: 0.3 mg/dL (ref 0.0–1.2)
CO2: 25 mmol/L (ref 20–29)
Calcium: 10.6 mg/dL — ABNORMAL HIGH (ref 8.7–10.3)
Chloride: 104 mmol/L (ref 96–106)
Creatinine, Ser: 0.71 mg/dL (ref 0.57–1.00)
Globulin, Total: 3.1 g/dL (ref 1.5–4.5)
Glucose: 94 mg/dL (ref 70–99)
Potassium: 4.3 mmol/L (ref 3.5–5.2)
Sodium: 143 mmol/L (ref 134–144)
Total Protein: 7.5 g/dL (ref 6.0–8.5)
eGFR: 90 mL/min/{1.73_m2} (ref >59)

## 2022-07-02 LAB — HEMOGLOBIN A1C
Est. average glucose Bld gHb Est-mCnc: 140 mg/dL
Hgb A1c MFr Bld: 6.5 % — ABNORMAL HIGH (ref 4.8–5.6)

## 2022-07-02 LAB — CBC
Hematocrit: 37.3 % (ref 34.0–46.6)
Hemoglobin: 12.7 g/dL (ref 11.1–15.9)
MCH: 28.2 pg (ref 26.6–33.0)
MCHC: 34 g/dL (ref 31.5–35.7)
MCV: 83 fL (ref 79–97)
Platelets: 361 10*3/uL (ref 150–450)
RBC: 4.5 x10E6/uL (ref 3.77–5.28)
RDW: 12.6 % (ref 11.7–15.4)
WBC: 4.6 10*3/uL (ref 3.4–10.8)

## 2022-07-02 LAB — IRON,TIBC AND FERRITIN PANEL
Ferritin: 90 ng/mL (ref 15–150)
Iron Saturation: 23 % (ref 15–55)
Iron: 70 ug/dL (ref 27–139)
Total Iron Binding Capacity: 299 ug/dL (ref 250–450)
UIBC: 229 ug/dL (ref 118–369)

## 2022-07-02 LAB — TSH: TSH: 0.437 u[IU]/mL — ABNORMAL LOW (ref 0.450–4.500)

## 2022-08-03 DIAGNOSIS — E119 Type 2 diabetes mellitus without complications: Secondary | ICD-10-CM | POA: Diagnosis not present

## 2022-08-03 LAB — HM DIABETES EYE EXAM

## 2022-09-08 ENCOUNTER — Ambulatory Visit (INDEPENDENT_AMBULATORY_CARE_PROVIDER_SITE_OTHER): Payer: PPO

## 2022-09-08 ENCOUNTER — Ambulatory Visit (INDEPENDENT_AMBULATORY_CARE_PROVIDER_SITE_OTHER): Payer: PPO | Admitting: Physician Assistant

## 2022-09-08 ENCOUNTER — Encounter: Payer: Self-pay | Admitting: Physician Assistant

## 2022-09-08 ENCOUNTER — Other Ambulatory Visit: Payer: Self-pay

## 2022-09-08 ENCOUNTER — Ambulatory Visit (INDEPENDENT_AMBULATORY_CARE_PROVIDER_SITE_OTHER): Payer: PPO | Admitting: Sports Medicine

## 2022-09-08 DIAGNOSIS — M1812 Unilateral primary osteoarthritis of first carpometacarpal joint, left hand: Secondary | ICD-10-CM | POA: Diagnosis not present

## 2022-09-08 DIAGNOSIS — M79645 Pain in left finger(s): Secondary | ICD-10-CM | POA: Diagnosis not present

## 2022-09-08 DIAGNOSIS — M79642 Pain in left hand: Secondary | ICD-10-CM | POA: Diagnosis not present

## 2022-09-08 MED ORDER — BETAMETHASONE SOD PHOS & ACET 6 (3-3) MG/ML IJ SUSP
6.0000 mg | INTRAMUSCULAR | Status: AC | PRN
  Administered 2022-09-08: 6 mg via INTRA_ARTICULAR

## 2022-09-08 MED ORDER — LIDOCAINE HCL 1 % IJ SOLN
0.5000 mL | INTRAMUSCULAR | Status: AC | PRN
  Administered 2022-09-08: .5 mL

## 2022-09-08 NOTE — Progress Notes (Signed)
   Procedure Note  Patient: Brooke Brown             Date of Birth: [birth year]           MRN: [id number]             Visit Date: 09/08/2022  Procedures: Visit Diagnoses:  1. Pain of left thumb   2. Primary osteoarthritis of first carpometacarpal joint of left hand    Small Joint Inj: L thumb CMC on 09/08/2022 3:42 PM Indications: pain Details: 27 G needle, ultrasound-guided radial approach Medications: 0.5 mL lidocaine 1 %; 6 mg betamethasone acetate-betamethasone sodium phosphate 6 (3-3) MG/ML Outcome: tolerated well, no immediate complications  Procedure: Ultrasound-guided CMC joint injection, left thumb After informed verbal consent was obtained a timeout was performed, patient was seated on exam table. The patient's left hand was placed on stable surface with medial hand on table.  Identification of the [facility] joint was performed in long axis under ultrasound guidance. Utilizing an in-plane approach, a 25-gauge, 1.5" needle was inserted under ultrasound guidance with needle entrance into the [facility] joint. The joint was subsequently injected with a 0.5:1.0 cc of lidocaine:celestone combination with visualization of injectate spread into the joint space.  Patient tolerated the procedure well without immediate complications.  Procedure, treatment alternatives, risks and benefits explained, specific risks discussed. Consent was given by the patient. Immediately prior to procedure a time out was called to verify the correct patient, procedure, equipment, support staff and site/side marked as required. Patient was prepped and draped in the usual sterile fashion.    - I evaluated the patient about 10 minutes post-injection and they had improvement in pain and range of motion - follow-up with Mary-Anne as indicated; I am happy to see them as needed - She did mention possibly wanting to get the right thumb injection, she is not present for this at time - We did fit her for a cool comfort  thumb brace today for the left  Madelyn Brunner, DO Primary Care Sports Medicine Physician  [facility]  This note was dictated using Dragon naturally speaking software and may contain errors in syntax, spelling, or content which have not been identified prior to signing this note.

## 2022-09-08 NOTE — Progress Notes (Signed)
Office Visit Note   Patient: Brooke Brown           Date of Birth: 1950-08-25           MRN: [id number] Visit Date: 09/08/2022              Requested by: Dorothyann Peng, MD [address] [place],  [place] [address] PCP: Dorothyann Peng, MD   Assessment & Plan: Visit Diagnoses:  1. Pain of left thumb     Plan: [name] is a pleasant 72 year old woman with a history of arthritis in her left thumb.  She has had an injection a couple years ago at [facility] she said the injection lasted about 3 to 4 months.  She also wears a support.  She is not interested in surgery at this time she does use topical anti-inflammatories as well.  She does not think the other injection was done under ultrasound guidance.  Discussed with her the possibility of Dr. Shon Baton doing an injection today.  She like to go forward for this.  If she does not get any long-term relief could consider referral to a hand surgeon  Follow-Up Instructions: No follow-ups on file.   Orders:  No orders of the defined types were placed in this encounter.  No orders of the defined types were placed in this encounter.     Procedures: No procedures performed   Clinical Data: No additional findings.   Subjective: No chief complaint on file.   HPI Pleasant right-hand-dominant 72 year old woman with a history of left thumb CMC arthritis.  This has been going on for a while.  She has had an injection a couple years ago and it helped for about 3 to 4 months.  She also uses topical anti-inflammatories rates her pain is moderate no paresthesias no other pain in any other fingers.  Previous 6 injection she does not think was done under ultrasound guidance Review of Systems  All other systems reviewed and are negative.    Objective: Vital Signs: There were no vitals taken for this visit.  Physical Exam Constitutional:      Appearance: Normal appearance.  Pulmonary:     Effort: Pulmonary effort is  normal.  Skin:    General: Skin is warm and dry.  Neurological:     General: No focal deficit present.     Mental Status: She is alert.     Ortho Exam Left thumb she has no redness no erythema no swelling.  She does have stiffness with opposition to her other fingers.  She does have some grinding with manipulation of the [facility] joint of the thumb.  Fingers are warm brisk capillary refill Specialty Comments:  No specialty comments available.  Imaging: No results found.   PMFS History: Patient Active Problem List   Diagnosis Date Noted   Pain of left thumb 09/08/2022   Hair loss 06/29/2022   Chronic renal disease, stage II 01/08/2022   Unilateral primary osteoarthritis, right knee 12/09/2021   Generalized anxiety disorder 11/19/2021   High vitamin D level 11/19/2021   Pain in right knee 09/19/2021   Colon cancer screening 07/30/2021   Flatulence, eructation and gas pain 07/30/2021   Gastroesophageal reflux disease 07/30/2021   History of colonic polyps 07/30/2021   Right lower quadrant pain 07/30/2021   Constipation 07/30/2021   Osteoarthritis of carpometacarpal (CMC) joint of thumb 03/04/2021   Bilateral hand pain 03/04/2021   Atherosclerosis of aorta 08/15/2020   Anosmia 02/27/2020   Glomerular  disorders in diseases classified elsewhere 02/27/2020   Hypothyroidism 02/27/2020   Other long term (current) drug therapy 02/27/2020   BMI 25.0-25.9,adult 12/26/2019   Vitamin D deficiency disease 12/26/2019   Pure hypercholesterolemia 03/29/2019   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   Overweight (BMI 25.0-29.9) 03/29/2019   Right wrist pain 09/30/2015   Unspecified essential hypertension 12/20/2013   Other and unspecified hyperlipidemia 12/20/2013   Type 2 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 disease, without long-term current use of insulin 12/20/2013   Neuralgia 12/20/2013   Neck pain 12/20/2013   Dizziness 12/20/2013   Past Medical History:  Diagnosis  Date   Anxiety    Arthritis    Diabetes mellitus    Grave's disease    Grave's disease    Hyperlipemia    Hypertension     Family History  Problem Relation Age of Onset   Pancreatic cancer Mother    Heart failure Father    Diabetes Brother    Breast cancer Cousin     Past Surgical History:  Procedure Laterality Date   ABDOMINAL HYSTERECTOMY     CATARACT EXTRACTION W/ INTRAOCULAR LENS  IMPLANT, BILATERAL Bilateral    CHOLECYSTECTOMY N/A 08/16/2017   Procedure: LAPAROSCOPIC CHOLECYSTECTOMY;  Surgeon: Jimmye Norman, MD;  Location: [facility] OR;  Service: General;  Laterality: N/A;   TUBAL LIGATION     Social History   Occupational History   Occupation: retired   Occupation: Print production planner  Tobacco Use   Smoking status: Never   Smokeless tobacco: Never  Building services engineer Use: Never used  Substance and Sexual Activity   Alcohol use: Not Currently    Alcohol/week: 0.0 standard drinks of alcohol    Comment: rarely   Drug use: No   Sexual activity: Not Currently

## 2022-09-21 ENCOUNTER — Other Ambulatory Visit: Payer: Self-pay | Admitting: Internal Medicine

## 2022-09-29 ENCOUNTER — Ambulatory Visit (INDEPENDENT_AMBULATORY_CARE_PROVIDER_SITE_OTHER): Payer: PPO | Admitting: Internal Medicine

## 2022-09-29 ENCOUNTER — Encounter: Payer: Self-pay | Admitting: Internal Medicine

## 2022-09-29 VITALS — BP 110/72 | HR 67 | Temp 98.4°F | Ht 65.0 in | Wt 147.0 lb

## 2022-09-29 DIAGNOSIS — N182 Chronic kidney disease, stage 2 (mild): Secondary | ICD-10-CM

## 2022-09-29 DIAGNOSIS — E7800 Pure hypercholesterolemia, unspecified: Secondary | ICD-10-CM

## 2022-09-29 DIAGNOSIS — E1122 Type 2 diabetes mellitus with diabetic chronic kidney disease: Secondary | ICD-10-CM | POA: Diagnosis not present

## 2022-09-29 DIAGNOSIS — E890 Postprocedural hypothyroidism: Secondary | ICD-10-CM

## 2022-09-29 DIAGNOSIS — I1310 Hypertensive heart and chronic kidney disease without heart failure, with stage 1 through stage 4 chronic kidney disease, or unspecified chronic kidney disease: Secondary | ICD-10-CM | POA: Diagnosis not present

## 2022-09-29 DIAGNOSIS — I700 Atherosclerosis of aorta: Secondary | ICD-10-CM | POA: Diagnosis not present

## 2022-09-29 MED ORDER — EMPAGLIFLOZIN 25 MG PO TABS
25.0000 mg | ORAL_TABLET | Freq: Every day | ORAL | 5 refills | Status: DC
Start: 2022-09-29 — End: 2023-02-02

## 2022-09-29 NOTE — Patient Instructions (Signed)

## 2022-09-29 NOTE — Progress Notes (Signed)
I,Brooke Brown,acting as a scribe for Brooke Aliment, MD.,have documented all relevant documentation on the behalf of Brooke Aliment, MD,as directed by  Brooke Aliment, MD while in the presence of Brooke Aliment, MD.    Subjective:     Patient ID: Brooke Brown , female    DOB: 1951-04-04 , 72 y.o.   MRN: [id number]   Chief Complaint  Patient presents with   Diabetes   Hypertension   Hyperlipidemia   Hypothyroidism    HPI  She presents today for diabetes and HTN check. She reports compliance with meds.  She denies headaches, chest pain and shortness of breath. She states her sugars have been doing well.   She reports her husband had a recent fall some weeks ago. He is to have surgery tomorrow. 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   Diabetes She presents for her follow-up diabetic visit. She has type 2 diabetes mellitus. Her disease course has been stable. There are no hypoglycemic associated symptoms. Pertinent negatives for diabetes include no blurred vision, no polydipsia, no polyphagia and no polyuria. There are no hypoglycemic complications. Diabetic complications include nephropathy. Risk factors for coronary artery disease include diabetes mellitus, dyslipidemia, hypertension, post-menopausal and sedentary lifestyle. Her weight is stable. She is following a diabetic diet. She participates in exercise three times a week. Her breakfast blood glucose is taken between 8-9 am. Her breakfast blood glucose range is generally 110-130 mg/dl. Eye exam is current.  Hypertension This is a chronic problem. The current episode started more than 1 year ago. The problem has been gradually improving since onset. The problem is controlled. Pertinent negatives include no blurred vision, neck pain or palpitations.  Hyperlipidemia     Past Medical History:  Diagnosis Date   Anxiety    Arthritis    Diabetes mellitus    Grave's disease    Grave's disease     Hyperlipemia    Hypertension      Family History  Problem Relation Age of Onset   Pancreatic cancer Mother    Heart failure Father    Diabetes Brother    Breast cancer Cousin      Current Outpatient Medications:    acetaminophen (TYLENOL) 500 MG tablet, Take 500 mg by mouth daily as needed for moderate pain or headache., Disp: , Rfl:    amLODipine (NORVASC) 5 MG tablet, Take 1 tablet (5 mg total) by mouth daily., Disp: 90 tablet, Rfl: 2   aspirin EC 81 MG tablet, Take 81 mg by mouth daily., Disp: , Rfl:    atorvastatin (LIPITOR) 40 MG tablet, Take one tab MWF and Saturdays, Disp: 48 tablet, Rfl: 3   Blood Glucose Monitoring Suppl (ONE TOUCH ULTRA 2) w/Device KIT, Use to check blood sugar 3 times a day. Dx code e11.65, Disp: 1 kit, Rfl: 2   Carboxymethylcellul-Glycerin (LUBRICATING EYE DROPS OP), Place 1 drop into both eyes 2 (two) times daily., Disp: , Rfl:    empagliflozin (JARDIANCE) 25 MG TABS tablet, Take 1 tablet (25 mg total) by mouth daily before breakfast., Disp: 30 tablet, Rfl: 5   hydrochlorothiazide (HYDRODIURIL) 25 MG tablet, Take 1 tablet by mouth once daily, Disp: 90 tablet, Rfl: 0   Magnesium 250 MG TABS, otc (Patient taking differently: Take 250 mg by mouth daily. otc), Disp:  , Rfl: 0   metFORMIN (GLUCOPHAGE-XR) 500 MG 24 hr tablet, Take 1 tablet by mouth twice daily, Disp: 180 tablet, Rfl: 0  olmesartan (BENICAR) 40 MG tablet, Take 1 tablet by mouth once daily, Disp: 90 tablet, Rfl: 0   OneTouch Delica Lancets 30G MISC, USE AS DIRECTED TO CHECK BLOOD SUGARS ONCE DAILY, Disp: 100 each, Rfl: 2   ONETOUCH ULTRA test strip, USE AS INSTRUCTED TO CHECK BLOOD SUGARS ONE TIME PER DAY, Disp: 100 each, Rfl: 2   sertraline (ZOLOFT) 50 MG tablet, Take 1 tablet (50 mg total) by mouth daily., Disp: 90 tablet, Rfl: 2   SYNTHROID 112 MCG tablet, TAKE 1 TABLET MONDAY THROUGH SATURDAY AND 1/2 TABLET ON SUNDAY, Disp: 90 tablet, Rfl: 1   No Known Allergies   Review of Systems   Constitutional: Negative.   Eyes:  Negative for blurred vision.  Respiratory: Negative.    Cardiovascular: Negative.  Negative for palpitations.  Endocrine: Negative for polydipsia, polyphagia and polyuria.  Musculoskeletal:  Negative for neck pain.  Skin: Negative.   Neurological: Negative.   Psychiatric/Behavioral: Negative.       Today's Vitals   09/29/22 1020  BP: 110/72  Pulse: 67  Temp: 98.4 F (36.9 C)  SpO2: 98%  Weight: 147 lb (66.7 kg)  Height: 5\' 5"  (1.651 m)   Body mass index is 24.46 kg/m.  Wt Readings from Last 3 Encounters:  09/29/22 147 lb (66.7 kg)  06/29/22 145 lb 3.2 oz (65.9 kg)  06/18/22 146 lb 9.6 oz (66.5 kg)    Objective:  Physical Exam Vitals and nursing note reviewed.  Constitutional:      Appearance: Normal appearance.  HENT:     Head: Normocephalic and atraumatic.  Eyes:     Extraocular Movements: Extraocular movements intact.  Cardiovascular:     Rate and Rhythm: Normal rate and regular rhythm.     Heart sounds: Normal heart sounds.  Pulmonary:     Effort: Pulmonary effort is normal.     Breath sounds: Normal breath sounds.  Musculoskeletal:     Cervical back: Normal range of motion.  Skin:    General: Skin is warm.  Neurological:     General: No focal deficit present.     Mental Status: She is alert.  Psychiatric:        Mood and Affect: Mood normal.        Behavior: Behavior normal.      Assessment And Plan:     1. Type 2 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 disease, without long-term current use of insulin (HCC) Comments: She was given samples of Jardiance, will not start until we have reviewed labs. IF started, she will rto in 4 weeks for re-evaluation. - Hemoglobin A1c - BMP8+eGFR  2. 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 She will c/w olmesartan 40mg  and HCTZ 25mg  daily.  Reminded to follow low sodium diet.  3. Atherosclerosis of aorta (HCC) Comments: Chronic, LDL goal < 70.  She will c/w atorvastatin 40mg  and ASA 81mg  daily.  4. Postablative hypothyroidism Comments: Chronic, I will check a thyroid panel and adjust meds as needed. - TSH - T4, free   Patient was given opportunity to ask questions. Patient verbalized understanding of the plan and was able to repeat key elements of the plan. All questions were answered to their satisfaction.   I, Brooke Aliment, MD, have reviewed all documentation for this visit. The documentation on 09/29/22 for the exam, diagnosis, procedures, and orders are all accurate and complete.  IF YOU HAVE BEEN REFERRED TO A SPECIALIST, IT MAY TAKE 1-2 WEEKS TO SCHEDULE/PROCESS THE REFERRAL. IF YOU HAVE NOT HEARD FROM US/SPECIALIST IN TWO WEEKS, PLEASE GIVE [place] A CALL AT [phone].   THE PATIENT IS ENCOURAGED TO PRACTICE SOCIAL DISTANCING DUE TO THE COVID-19 PANDEMIC.

## 2022-09-30 LAB — BMP8+EGFR
BUN/Creatinine Ratio: 17 (ref 12–28)
BUN: 15 mg/dL (ref 8–27)
CO2: 25 mmol/L (ref 20–29)
Calcium: 9.9 mg/dL (ref 8.7–10.3)
Chloride: 103 mmol/L (ref 96–106)
Creatinine, Ser: 0.87 mg/dL (ref 0.57–1.00)
Glucose: 88 mg/dL (ref 70–99)
Potassium: 4.3 mmol/L (ref 3.5–5.2)
Sodium: 142 mmol/L (ref 134–144)
eGFR: 70 mL/min/{1.73_m2} (ref >59)

## 2022-09-30 LAB — HEMOGLOBIN A1C
Est. average glucose Bld gHb Est-mCnc: 146 mg/dL
Hgb A1c MFr Bld: 6.7 % — ABNORMAL HIGH (ref 4.8–5.6)

## 2022-09-30 LAB — T4, FREE: Free T4: 1.5 ng/dL (ref 0.82–1.77)

## 2022-09-30 LAB — TSH: TSH: 1.33 u[IU]/mL (ref 0.450–4.500)

## 2022-10-01 ENCOUNTER — Encounter: Payer: Self-pay | Admitting: Internal Medicine

## 2022-11-14 ENCOUNTER — Other Ambulatory Visit: Payer: Self-pay | Admitting: Internal Medicine

## 2022-11-15 ENCOUNTER — Other Ambulatory Visit: Payer: Self-pay | Admitting: Internal Medicine

## 2022-11-16 ENCOUNTER — Ambulatory Visit (INDEPENDENT_AMBULATORY_CARE_PROVIDER_SITE_OTHER): Payer: PPO | Admitting: Internal Medicine

## 2022-11-16 ENCOUNTER — Encounter: Payer: Self-pay | Admitting: Internal Medicine

## 2022-11-16 VITALS — BP 120/72 | HR 69 | Temp 98.2°F | Ht 65.0 in | Wt 146.6 lb

## 2022-11-16 DIAGNOSIS — E1122 Type 2 diabetes mellitus with diabetic chronic kidney disease: Secondary | ICD-10-CM | POA: Diagnosis not present

## 2022-11-16 DIAGNOSIS — I1310 Hypertensive heart and chronic kidney disease without heart failure, with stage 1 through stage 4 chronic kidney disease, or unspecified chronic kidney disease: Secondary | ICD-10-CM

## 2022-11-16 DIAGNOSIS — N182 Chronic kidney disease, stage 2 (mild): Secondary | ICD-10-CM

## 2022-11-16 DIAGNOSIS — I700 Atherosclerosis of aorta: Secondary | ICD-10-CM | POA: Diagnosis not present

## 2022-11-16 DIAGNOSIS — L659 Nonscarring hair loss, unspecified: Secondary | ICD-10-CM

## 2022-11-16 DIAGNOSIS — Z79899 Other long term (current) drug therapy: Secondary | ICD-10-CM | POA: Diagnosis not present

## 2022-11-16 DIAGNOSIS — H6123 Impacted cerumen, bilateral: Secondary | ICD-10-CM

## 2022-11-16 NOTE — Patient Instructions (Addendum)
BE SURE TO TAKE 1/2 TABLET OF hydrochlorothiazide STARTING TODAY 6/24  Type 2 Diabetes Mellitus, Diagnosis, Adult Type 2 diabetes (type 2 diabetes mellitus) is a long-term (chronic) disease. It may happen when there is one or both of these problems: The pancreas does not make enough insulin. The body does not react in a normal way to insulin that it makes. Insulin lets sugars go into cells in your body. If you have type 2 diabetes, sugars cannot get into your cells. Sugars build up in the blood. This causes high blood sugar. What are the causes? The exact cause of this condition is not known. What increases the risk? Having type 2 diabetes in your family. Being overweight or very overweight. Not being active. Your body not reacting in a normal way to the insulin it makes. Having higher than normal blood sugar over time. Having a type of diabetes when you were pregnant. Having a condition that causes small fluid-filled sacs on your ovaries. What are the signs or symptoms? At first, you may have no symptoms. You will get symptoms slowly. They may include: More thirst than normal. More hunger than normal. Needing to pee more than normal. Losing weight without trying. Feeling tired. Feeling weak. Seeing things blurry. Dark patches on your skin. How is this treated? This condition may be treated by a diabetes expert. You may need to: Follow an eating plan made by a food expert (dietitian). Get regular exercise. Find ways to deal with stress. Check blood sugar as often as told. Take medicines. Your doctor will set treatment goals for you. Your blood sugar should be at these levels: Before meals: 80-130 mg/dL (1.6-1.0 mmol/L). After meals: below 180 mg/dL (10 mmol/L). Over the last 2-3 months: less than 7%. Follow these instructions at home: Medicines Take your diabetes medicines or insulin every day. Take medicines as told to help you prevent other problems caused by this  condition. You may need: Aspirin. Medicine to lower cholesterol. Medicine to control blood pressure. Questions to ask your doctor Should I meet with a diabetes educator? What medicines do I need, and when should I take them? What will I need to treat my condition at home? When should I check my blood sugar? Where can I find a support group? Who can I call if I have questions? When is my next doctor visit? General instructions Take over-the-counter and prescription medicines only as told by your doctor. Keep all follow-up visits. Where to find more information For help and guidance and more information about diabetes, please go to: American Diabetes Association (ADA): www.diabetes.org American Association of Diabetes Care and Education Specialists (ADCES): www.diabeteseducator.org International Diabetes Federation (IDF): DCOnly.dk Contact a doctor if: Your blood sugar is at or above 240 mg/dL (96.0 mmol/L) for 2 days in a row. You have been sick for 2 days or more, and you are not getting better. You have had a fever for 2 days or more, and you are not getting better. You have any of these problems for more than 6 hours: You cannot eat or drink. You feel like you may vomit. You vomit. You have watery poop (diarrhea). Get help right away if: Your blood sugar is lower than 54 mg/dL (3 mmol/L). You feel mixed up (confused). You have trouble thinking clearly. You have trouble breathing. You have medium or large ketone levels in your pee. These symptoms may be an emergency. Get help right away. Call your local emergency services (911 in the [place] Do not wait  to see if the symptoms will go away. Do not drive yourself to the hospital. Summary Type 2 diabetes is a long-term disease. Your pancreas may not make enough insulin, or your body may not react in a normal way to insulin that it makes. This condition is treated with an eating plan, lifestyle changes, and medicines. Your  doctor will set treatment goals for you. These will help you keep your blood sugar in a healthy range. Keep all follow-up visits. This information is not intended to replace advice given to you by your health care provider. Make sure you discuss any questions you have with your health care provider. Document Revised: 08/05/2020 Document Reviewed: 08/05/2020 Elsevier Patient Education  2024 [employer].

## 2022-11-16 NOTE — Progress Notes (Signed)
I,Victoria T Deloria Lair, CMA,acting as a Neurosurgeon for Gwynneth Aliment, MD.,have documented all relevant documentation on the behalf of Gwynneth Aliment, MD,as directed by  Gwynneth Aliment, MD while in the presence of Gwynneth Aliment, MD.  Subjective:  Patient ID: Brooke Brown , female    DOB: 1951/04/20 , 72 y.o.   MRN: [id number]  Chief Complaint  Patient presents with   Med Check    HPI  Patient presents today for Jardiance follow up. She reports compliance with medication. She admits completing 4 weeks worth of samples given. Initially, she experienced dizziness; however, this has since resolved.   While here today she would like her right ear looked at, she admits it feels " stopped up".   Diabetes She presents for her follow-up diabetic visit. She has type 2 diabetes mellitus. Her disease course has been stable. There are no hypoglycemic associated symptoms. Pertinent negatives for diabetes include no blurred vision, no polydipsia, no polyphagia and no polyuria. There are no hypoglycemic complications. Diabetic complications include nephropathy. Risk factors for coronary artery disease include diabetes mellitus, dyslipidemia, hypertension, post-menopausal and sedentary lifestyle. Her weight is stable. She is following a diabetic diet. She participates in exercise three times a week. Her breakfast blood glucose is taken between 8-9 am. Her breakfast blood glucose range is generally 110-130 mg/dl. Eye exam is current.  Hypertension This is a chronic problem. The current episode started more than 1 year ago. The problem has been gradually improving since onset. The problem is controlled. Pertinent negatives include no blurred vision, neck pain or palpitations.     Past Medical History:  Diagnosis Date   Anxiety    Arthritis    Diabetes mellitus    Grave's disease    Grave's disease    Hyperlipemia    Hypertension      Family History  Problem Relation Age of Onset    Pancreatic cancer Mother    Heart failure Father    Diabetes Brother    Breast cancer Cousin      Current Outpatient Medications:    acetaminophen (TYLENOL) 500 MG tablet, Take 500 mg by mouth daily as needed for moderate pain or headache., Disp: , Rfl:    amLODipine (NORVASC) 5 MG tablet, Take 1 tablet (5 mg total) by mouth daily., Disp: 90 tablet, Rfl: 2   aspirin EC 81 MG tablet, Take 81 mg by mouth daily., Disp: , Rfl:    atorvastatin (LIPITOR) 40 MG tablet, Take one tab MWF and Saturdays, Disp: 48 tablet, Rfl: 3   Blood Glucose Monitoring Suppl (ONE TOUCH ULTRA 2) w/Device KIT, Use to check blood sugar 3 times a day. Dx code e11.65, Disp: 1 kit, Rfl: 2   Carboxymethylcellul-Glycerin (LUBRICATING EYE DROPS OP), Place 1 drop into both eyes 2 (two) times daily., Disp: , Rfl:    empagliflozin (JARDIANCE) 25 MG TABS tablet, Take 1 tablet (25 mg total) by mouth daily before breakfast., Disp: 30 tablet, Rfl: 5   hydrochlorothiazide (HYDRODIURIL) 25 MG tablet, Take 1 tablet by mouth once daily, Disp: 90 tablet, Rfl: 0   Magnesium 250 MG TABS, otc (Patient taking differently: Take 250 mg by mouth daily. otc), Disp:  , Rfl: 0   metFORMIN (GLUCOPHAGE-XR) 500 MG 24 hr tablet, Take 1 tablet by mouth twice daily, Disp: 180 tablet, Rfl: 0   olmesartan (BENICAR) 40 MG tablet, Take 1 tablet by mouth once daily, Disp: 90 tablet, Rfl: 0   OneTouch Delica Lancets 30G MISC,  USE AS DIRECTED TO CHECK BLOOD SUGARS ONCE DAILY, Disp: 100 each, Rfl: 2   ONETOUCH ULTRA test strip, USE AS INSTRUCTED TO CHECK BLOOD SUGARS ONE TIME PER DAY, Disp: 100 each, Rfl: 2   sertraline (ZOLOFT) 50 MG tablet, Take 1 tablet (50 mg total) by mouth daily., Disp: 90 tablet, Rfl: 2   SYNTHROID 112 MCG tablet, TAKE 1 TABLET MONDAY THROUGH SATURDAY AND 1/2 TABLET ON SUNDAY, Disp: 90 tablet, Rfl: 1   No Known Allergies   Review of Systems  Constitutional: Negative.   Eyes:  Negative for blurred vision.  Respiratory: Negative.     Cardiovascular: Negative.  Negative for palpitations.  Gastrointestinal: Negative.   Endocrine: Negative for polydipsia, polyphagia and polyuria.  Musculoskeletal:  Negative for neck pain.  Skin:        She reports noticing more hair breakage and thinning. She thinks it is due to one of her medications. She is transitioning to natural hair.   Neurological: Negative.   Psychiatric/Behavioral: Negative.       Today's Vitals   11/16/22 0827  BP: 120/72  Pulse: 69  Temp: 98.2 F (36.8 C)  SpO2: 98%  Weight: 146 lb 9.6 oz (66.5 kg)  Height: 5\' 5"  (1.651 m)   Body mass index is 24.4 kg/m.  Wt Readings from Last 3 Encounters:  11/16/22 146 lb 9.6 oz (66.5 kg)  09/29/22 147 lb (66.7 kg)  06/29/22 145 lb 3.2 oz (65.9 kg)     Objective:  Physical Exam Vitals and nursing note reviewed.  Constitutional:      Appearance: Normal appearance.  HENT:     Head: Normocephalic and atraumatic.     Right Ear: Ear canal and external ear normal. There is impacted cerumen.     Left Ear: Ear canal and external ear normal. There is impacted cerumen.  Eyes:     Extraocular Movements: Extraocular movements intact.  Cardiovascular:     Rate and Rhythm: Normal rate and regular rhythm.     Heart sounds: Normal heart sounds.  Pulmonary:     Effort: Pulmonary effort is normal.     Breath sounds: Normal breath sounds.  Musculoskeletal:     Cervical back: Normal range of motion.  Skin:    General: Skin is warm.  Neurological:     General: No focal deficit present.     Mental Status: She is alert.  Psychiatric:        Mood and Affect: Mood normal.        Behavior: Behavior normal.         Assessment And Plan:  1. Type 2 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 disease, without long-term current use of insulin (HCC) Comments: I will check a BMP today. She will continue with Jardiance for now.  She will f/u Sept 2024 for her next diabetes check. - BMP8+EGFR  2. Hypertensive heart and  renal disease with renal failure, stage 1 through stage 4 or unspecified chronic kidney disease, without heart failure Comments: Well controlled. I will decrease HCTZ to 25mg  1/2 tablet daily. She verbally acknowledges new treatment plan. She will f/u in 2 wks for NV, bp check.  3. Atherosclerosis of aorta (HCC) Comments: Chronic, LDL goal < 70. She will c/w statin therapy.  4. Bilateral impacted cerumen Comments: After verbal consent, both ears were flushed by irrigation without complication. No TM abnormalities were noted. - Ear Lavage  5. Hair thinning Comments: I will check an iron panel today. - Iron,  TIBC and Ferritin Panel  6. Drug therapy - BMP8+EGFR    Return in 2 weeks (on 11/30/2022), or NV bp check.  Patient was given opportunity to ask questions. Patient verbalized understanding of the plan and was able to repeat key elements of the plan. All questions were answered to their satisfaction.   I, Gwynneth Aliment, MD, have reviewed all documentation for this visit. The documentation on 11/16/22 for the exam, diagnosis, procedures, and orders are all accurate and complete.   IF YOU HAVE BEEN REFERRED TO A SPECIALIST, IT MAY TAKE 1-2 WEEKS TO SCHEDULE/PROCESS THE REFERRAL. IF YOU HAVE NOT HEARD FROM US/SPECIALIST IN TWO WEEKS, PLEASE GIVE [place] A CALL AT [phone] X [phone].

## 2022-11-17 LAB — BMP8+EGFR
BUN/Creatinine Ratio: 15 (ref 12–28)
BUN: 13 mg/dL (ref 8–27)
CO2: 24 mmol/L (ref 20–29)
Calcium: 10 mg/dL (ref 8.7–10.3)
Chloride: 107 mmol/L — ABNORMAL HIGH (ref 96–106)
Creatinine, Ser: 0.87 mg/dL (ref 0.57–1.00)
Glucose: 112 mg/dL — ABNORMAL HIGH (ref 70–99)
Potassium: 4.7 mmol/L (ref 3.5–5.2)
Sodium: 145 mmol/L — ABNORMAL HIGH (ref 134–144)
eGFR: 70 mL/min/{1.73_m2} (ref >59)

## 2022-11-17 LAB — IRON,TIBC AND FERRITIN PANEL
Ferritin: 73 ng/mL (ref 15–150)
Iron Saturation: 21 % (ref 15–55)
Iron: 62 ug/dL (ref 27–139)
Total Iron Binding Capacity: 300 ug/dL (ref 250–450)
UIBC: 238 ug/dL (ref 118–369)

## 2022-12-01 ENCOUNTER — Ambulatory Visit: Payer: PPO

## 2022-12-01 VITALS — BP 118/72 | HR 84 | Temp 98.1°F | Ht 65.0 in | Wt 146.0 lb

## 2022-12-01 DIAGNOSIS — I1310 Hypertensive heart and chronic kidney disease without heart failure, with stage 1 through stage 4 chronic kidney disease, or unspecified chronic kidney disease: Secondary | ICD-10-CM

## 2022-12-01 NOTE — Patient Instructions (Signed)
Hypertension, Adult Hypertension is another name for high blood pressure. High blood pressure forces your heart to work harder to pump blood. This can cause problems over time. There are two numbers in a blood pressure reading. There is a top number (systolic) over a bottom number (diastolic). It is best to have a blood pressure that is below 120/80. What are the causes? The cause of this condition is not known. Some other conditions can lead to high blood pressure. What increases the risk? Some lifestyle factors can make you more likely to develop high blood pressure: Smoking. Not getting enough exercise or physical activity. Being overweight. Having too much fat, sugar, calories, or salt (sodium) in your diet. Drinking too much alcohol. Other risk factors include: Having any of these conditions: Heart disease. Diabetes. High cholesterol. Kidney disease. Obstructive sleep apnea. Having a family history of high blood pressure and high cholesterol. Age. The risk increases with age. Stress. What are the signs or symptoms? High blood pressure may not cause symptoms. Very high blood pressure (hypertensive crisis) may cause: Headache. Fast or uneven heartbeats (palpitations). Shortness of breath. Nosebleed. Vomiting or feeling like you may vomit (nauseous). Changes in how you see. Very bad chest pain. Feeling dizzy. Seizures. How is this treated? This condition is treated by making healthy lifestyle changes, such as: Eating healthy foods. Exercising more. Drinking less alcohol. Your doctor may prescribe medicine if lifestyle changes do not help enough and if: Your top number is above 130. Your bottom number is above 80. Your personal target blood pressure may vary. Follow these instructions at home: Eating and drinking  If told, follow the DASH eating plan. To follow this plan: Fill one half of your plate at each meal with fruits and vegetables. Fill one fourth of your plate  at each meal with whole grains. Whole grains include whole-wheat pasta, brown rice, and whole-grain bread. Eat or drink low-fat dairy products, such as skim milk or low-fat yogurt. Fill one fourth of your plate at each meal with low-fat (lean) proteins. Low-fat proteins include fish, chicken without skin, eggs, beans, and tofu. Avoid fatty meat, cured and processed meat, or chicken with skin. Avoid pre-made or processed food. Limit the amount of salt in your diet to less than 1,500 mg each day. Do not drink alcohol if: Your doctor tells you not to drink. You are pregnant, may be pregnant, or are planning to become pregnant. If you drink alcohol: Limit how much you have to: 0-1 drink a day for women. 0-2 drinks a day for men. Know how much alcohol is in your drink. In the [place] one drink equals one 12 oz bottle of beer (355 mL), one 5 oz glass of wine (148 mL), or one 1 oz glass of hard liquor (44 mL). Lifestyle  Work with your doctor to stay at a healthy weight or to lose weight. Ask your doctor what the best weight is for you. Get at least 30 minutes of exercise that causes your heart to beat faster (aerobic exercise) most days of the week. This may include walking, swimming, or biking. Get at least 30 minutes of exercise that strengthens your muscles (resistance exercise) at least 3 days a week. This may include lifting weights or doing Pilates. Do not smoke or use any products that contain nicotine or tobacco. If you need help quitting, ask your doctor. Check your blood pressure at home as told by your doctor. Keep all follow-up visits. Medicines Take over-the-counter and prescription medicines   only as told by your doctor. Follow directions carefully. Do not skip doses of blood pressure medicine. The medicine does not work as well if you skip doses. Skipping doses also puts you at risk for problems. Ask your doctor about side effects or reactions to medicines that you should watch  for. Contact a doctor if: You think you are having a reaction to the medicine you are taking. You have headaches that keep coming back. You feel dizzy. You have swelling in your ankles. You have trouble with your vision. Get help right away if: You get a very bad headache. You start to feel mixed up (confused). You feel weak or numb. You feel faint. You have very bad pain in your: Chest. Belly (abdomen). You vomit more than once. You have trouble breathing. These symptoms may be an emergency. Get help right away. Call 911. Do not wait to see if the symptoms will go away. Do not drive yourself to the hospital. Summary Hypertension is another name for high blood pressure. High blood pressure forces your heart to work harder to pump blood. For most people, a normal blood pressure is less than 120/80. Making healthy choices can help lower blood pressure. If your blood pressure does not get lower with healthy choices, you may need to take medicine. This information is not intended to replace advice given to you by your health care provider. Make sure you discuss any questions you have with your health care provider. Document Revised: 02/27/2021 Document Reviewed: 02/27/2021 Elsevier Patient Education  2024 [employer]  

## 2022-12-01 NOTE — Progress Notes (Signed)
Patient presents today for bpc. She currently takes Amlodipine 5MG  at night , hydrochlorothiazide 25MG  at previous visit this was decreased to half tablet in the AM & Olmesartan 40MG  in the AM. She reports compliance with medications. Denies headache, chest pain, SOB.  BP Readings from Last 3 Encounters:  12/01/22 118/72  11/16/22 120/72  09/29/22 110/72  Per provider patient is to continue current medication regimen. She is scheduled to follow up at September visit. Patient aware to continue with 1/2 tablet of hydrochlorothiazide 25MG . Change documented in chart.

## 2022-12-14 ENCOUNTER — Other Ambulatory Visit: Payer: Self-pay | Admitting: Internal Medicine

## 2023-02-02 ENCOUNTER — Ambulatory Visit (INDEPENDENT_AMBULATORY_CARE_PROVIDER_SITE_OTHER): Payer: PPO | Admitting: Internal Medicine

## 2023-02-02 ENCOUNTER — Encounter: Payer: Self-pay | Admitting: Internal Medicine

## 2023-02-02 VITALS — BP 110/78 | HR 72 | Temp 98.0°F | Ht 65.0 in | Wt 146.8 lb

## 2023-02-02 DIAGNOSIS — F411 Generalized anxiety disorder: Secondary | ICD-10-CM

## 2023-02-02 DIAGNOSIS — E1122 Type 2 diabetes mellitus with diabetic chronic kidney disease: Secondary | ICD-10-CM

## 2023-02-02 DIAGNOSIS — I700 Atherosclerosis of aorta: Secondary | ICD-10-CM | POA: Diagnosis not present

## 2023-02-02 DIAGNOSIS — I1310 Hypertensive heart and chronic kidney disease without heart failure, with stage 1 through stage 4 chronic kidney disease, or unspecified chronic kidney disease: Secondary | ICD-10-CM | POA: Diagnosis not present

## 2023-02-02 DIAGNOSIS — N182 Chronic kidney disease, stage 2 (mild): Secondary | ICD-10-CM

## 2023-02-02 DIAGNOSIS — R442 Other hallucinations: Secondary | ICD-10-CM

## 2023-02-02 MED ORDER — METFORMIN HCL ER 500 MG PO TB24: ORAL_TABLET | ORAL | 2 refills | Status: DC

## 2023-02-02 MED ORDER — SYNTHROID 112 MCG PO TABS: ORAL_TABLET | ORAL | 1 refills | Status: DC

## 2023-02-02 MED ORDER — OLMESARTAN MEDOXOMIL 40 MG PO TABS: 40.0000 mg | ORAL_TABLET | Freq: Every day | ORAL | 2 refills | Status: DC

## 2023-02-02 MED ORDER — HYDROCHLOROTHIAZIDE 25 MG PO TABS: ORAL_TABLET | ORAL | 2 refills | Status: DC

## 2023-02-02 MED ORDER — SERTRALINE HCL 50 MG PO TABS
50.0000 mg | ORAL_TABLET | Freq: Every day | ORAL | 2 refills | Status: DC
Start: 2023-02-02 — End: 2024-01-06

## 2023-02-02 MED ORDER — EMPAGLIFLOZIN 25 MG PO TABS: 25.0000 mg | ORAL_TABLET | Freq: Every day | ORAL | 2 refills | Status: DC

## 2023-02-02 NOTE — Assessment & Plan Note (Signed)
Chronic, she will continue with metformin XR ONCE daily and Jardiance 25mg  daily. I will check renal function today.

## 2023-02-02 NOTE — Assessment & Plan Note (Signed)
Chronic, well controlled. She will continue with amlodipine 5mg  daily, hydrochlorothiazide 25mg  daily and olmesartan 40mg  daily. She is encouraged to follow low sodium diet.

## 2023-02-02 NOTE — Progress Notes (Signed)
I,Victoria T Deloria Lair, CMA,acting as a Neurosurgeon for Gwynneth Aliment, MD.,have documented all relevant documentation on the behalf of Gwynneth Aliment, MD,as directed by  Gwynneth Aliment, MD while in the presence of Gwynneth Aliment, MD.  Subjective:  Patient ID: Brooke Brown , female    DOB: 1950-05-26 , 72 y.o.   MRN: [id number]  Chief Complaint  Patient presents with   Diabetes   Hypertension   Hyperlipidemia    HPI  Patient presents today for diabetes, blood pressure & cholesterol follow up. She reports compliance with medications. Denies headache, chest pain, and SOB. She wants to clarify dosing instructions for metformin.       Diabetes She presents for her follow-up diabetic visit. She has type 2 diabetes mellitus. Her disease course has been stable. There are no hypoglycemic associated symptoms. Pertinent negatives for diabetes include no blurred vision, no polydipsia, no polyphagia and no polyuria. There are no hypoglycemic complications. Diabetic complications include nephropathy. Risk factors for coronary artery disease include diabetes mellitus, dyslipidemia, hypertension, post-menopausal and sedentary lifestyle. Her weight is stable. She is following a diabetic diet. She participates in exercise three times a week. Her breakfast blood glucose is taken between 8-9 am. Her breakfast blood glucose range is generally 110-130 mg/dl. Eye exam is current.  Hypertension This is a chronic problem. The current episode started more than 1 year ago. The problem has been gradually improving since onset. The problem is controlled. Pertinent negatives include no blurred vision, neck pain or palpitations.  Hyperlipidemia     Past Medical History:  Diagnosis Date   Anxiety    Arthritis    Diabetes mellitus    Grave's disease    Grave's disease    Hyperlipemia    Hypertension      Family History  Problem Relation Age of Onset   Pancreatic cancer Mother    Heart failure  Father    Diabetes Brother    Breast cancer Cousin      Current Outpatient Medications:    acetaminophen (TYLENOL) 500 MG tablet, Take 500 mg by mouth daily as needed for moderate pain or headache., Disp: , Rfl:    amLODipine (NORVASC) 5 MG tablet, Take 1 tablet (5 mg total) by mouth daily., Disp: 90 tablet, Rfl: 2   aspirin EC 81 MG tablet, Take 81 mg by mouth daily., Disp: , Rfl:    atorvastatin (LIPITOR) 40 MG tablet, Take one tab MWF and Saturdays, Disp: 48 tablet, Rfl: 3   Blood Glucose Monitoring Suppl (ONE TOUCH ULTRA 2) w/Device KIT, Use to check blood sugar 3 times a day. Dx code e11.65, Disp: 1 kit, Rfl: 2   Carboxymethylcellul-Glycerin (LUBRICATING EYE DROPS OP), Place 1 drop into both eyes 2 (two) times daily., Disp: , Rfl:    Magnesium 250 MG TABS, otc (Patient taking differently: Take 250 mg by mouth daily. otc), Disp:  , Rfl: 0   OneTouch Delica Lancets 30G MISC, USE 1  TO CHECK GLUCOSE ONCE DAILY, Disp: 100 each, Rfl: 0   ONETOUCH ULTRA test strip, USE 1 STRIP TO CHECK GLUCOSE ONCE DAILY, Disp: 100 each, Rfl: 0   empagliflozin (JARDIANCE) 25 MG TABS tablet, Take 1 tablet (25 mg total) by mouth daily before breakfast., Disp: 90 tablet, Rfl: 2   hydrochlorothiazide (HYDRODIURIL) 25 MG tablet, Patient reports taking 1/2 tab in the morning. PER RS., Disp: 45 tablet, Rfl: 2   metFORMIN (GLUCOPHAGE-XR) 500 MG 24 hr tablet, One tab po qd, Disp:  90 tablet, Rfl: 2   olmesartan (BENICAR) 40 MG tablet, Take 1 tablet (40 mg total) by mouth daily., Disp: 90 tablet, Rfl: 2   sertraline (ZOLOFT) 50 MG tablet, Take 1 tablet (50 mg total) by mouth daily., Disp: 90 tablet, Rfl: 2   SYNTHROID 112 MCG tablet, TAKE 1 TABLET MONDAY THROUGH SATURDAY AND SKIP SUNDAYS., Disp: 90 tablet, Rfl: 1   No Known Allergies   Review of Systems  Constitutional: Negative.   Eyes:  Negative for blurred vision.  Respiratory: Negative.    Cardiovascular: Negative.  Negative for palpitations.  Gastrointestinal:  Negative.   Endocrine: Negative for polydipsia, polyphagia and polyuria.  Musculoskeletal: Negative.  Negative for neck pain.  Neurological: Negative.   Psychiatric/Behavioral: Negative.       Today's Vitals   02/02/23 0926  BP: 110/78  Pulse: 72  Temp: 98 F (36.7 C)  SpO2: 98%  Weight: 146 lb 12.8 oz (66.6 kg)  Height: 5\' 5"  (1.651 m)   Body mass index is 24.43 kg/m.  Wt Readings from Last 3 Encounters:  02/02/23 146 lb 12.8 oz (66.6 kg)  12/01/22 146 lb (66.2 kg)  11/16/22 146 lb 9.6 oz (66.5 kg)     Objective:  Physical Exam Vitals and nursing note reviewed.  Constitutional:      Appearance: Normal appearance.  HENT:     Head: Normocephalic and atraumatic.  Eyes:     Extraocular Movements: Extraocular movements intact.  Cardiovascular:     Rate and Rhythm: Normal rate and regular rhythm.     Heart sounds: Normal heart sounds.  Pulmonary:     Effort: Pulmonary effort is normal.     Breath sounds: Normal breath sounds.  Musculoskeletal:     Cervical back: Normal range of motion.  Skin:    General: Skin is warm.  Neurological:     General: No focal deficit present.     Mental Status: She is alert.  Psychiatric:        Mood and Affect: Mood normal.        Behavior: Behavior normal.         Assessment And Plan:  Type 2 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 disease, without long-term current use of insulin (HCC) Assessment & Plan: Chronic, she will continue with metformin XR ONCE daily and Jardiance 25mg  daily. I will check renal function today.   Orders: -     CMP14+EGFR -     Lipid panel -     Hemoglobin A1c -     Microalbumin / creatinine urine ratio  Hypertensive heart and renal disease with renal failure, stage 1 through stage 4 or unspecified chronic kidney disease, without heart failure Assessment & Plan: Chronic, well controlled. She will continue with amlodipine 5mg  daily, hydrochlorothiazide 25mg  daily and olmesartan 40mg  daily. She  is encouraged to follow low sodium diet.   Orders: -     CMP14+EGFR  Atherosclerosis of aorta (HCC) Assessment & Plan: Chronic, LDL goal is less than 70.  She will continue with atorvastatin 40mg  daily. She is encouraged to follow heart healthy lifestyle.   Orders: -     Lipid panel  Generalized anxiety disorder Assessment & Plan: Chronic, sx have improved with use of sertraline. Will send refill to the pharmacy.   Orders: -     Sertraline HCl; Take 1 tablet (50 mg total) by mouth daily.  Dispense: 90 tablet; Refill: 2  Phantosmia Assessment & Plan: Causes include problems with the nose, such  as sinusitis, or conditions of the nervous system or brain, including migraine and stroke. I planned to check head CT, but she declined. She plans to switch her soap to see if her sx improve.    Other orders -     metFORMIN HCl ER; One tab po qd  Dispense: 90 tablet; Refill: 2 -     Empagliflozin; Take 1 tablet (25 mg total) by mouth daily before breakfast.  Dispense: 90 tablet; Refill: 2 -     hydroCHLOROthiazide; Patient reports taking 1/2 tab in the morning. PER RS.  Dispense: 45 tablet; Refill: 2 -     Olmesartan Medoxomil; Take 1 tablet (40 mg total) by mouth daily.  Dispense: 90 tablet; Refill: 2 -     Synthroid; TAKE 1 TABLET MONDAY THROUGH SATURDAY AND SKIP SUNDAYS.  Dispense: 90 tablet; Refill: 1     Return if symptoms worsen or fail to improve.  Patient was given opportunity to ask questions. Patient verbalized understanding of the plan and was able to repeat key elements of the plan. All questions were answered to their satisfaction.    I, Gwynneth Aliment, MD, have reviewed all documentation for this visit. The documentation on 02/02/23 for the exam, diagnosis, procedures, and orders are all accurate and complete.   IF YOU HAVE BEEN REFERRED TO A SPECIALIST, IT MAY TAKE 1-2 WEEKS TO SCHEDULE/PROCESS THE REFERRAL. IF YOU HAVE NOT HEARD FROM US/SPECIALIST IN TWO WEEKS, PLEASE  GIVE [place] A CALL AT [phone].   THE PATIENT IS ENCOURAGED TO PRACTICE SOCIAL DISTANCING DUE TO THE COVID-19 PANDEMIC.

## 2023-02-02 NOTE — Assessment & Plan Note (Signed)
Chronic, LDL goal is less than 70.  She will continue with atorvastatin 40mg  daily. She is encouraged to follow heart healthy lifestyle.

## 2023-02-02 NOTE — Patient Instructions (Signed)

## 2023-02-02 NOTE — Assessment & Plan Note (Signed)
Chronic, sx have improved with use of sertraline. Will send refill to the pharmacy.

## 2023-02-02 NOTE — Assessment & Plan Note (Addendum)
Causes include problems with the nose, such as sinusitis, or conditions of the nervous system or brain, including migraine and stroke. I planned to check head CT, but she declined. She plans to switch her soap to see if her sx improve.

## 2023-02-03 ENCOUNTER — Other Ambulatory Visit: Payer: Self-pay | Admitting: Internal Medicine

## 2023-02-03 DIAGNOSIS — Z1231 Encounter for screening mammogram for malignant neoplasm of breast: Secondary | ICD-10-CM

## 2023-02-03 LAB — MICROALBUMIN / CREATININE URINE RATIO
Creatinine, Urine: 271.1 mg/dL
Microalb/Creat Ratio: 9 mg/g{creat} (ref 0–29)
Microalbumin, Urine: 24.1 ug/mL

## 2023-02-03 LAB — LIPID PANEL
Chol/HDL Ratio: 2.9 ratio (ref 0.0–4.4)
Cholesterol, Total: 172 mg/dL (ref 100–199)
HDL: 59 mg/dL (ref >39)
LDL Chol Calc (NIH): 91 mg/dL (ref 0–99)
Triglycerides: 122 mg/dL (ref 0–149)
VLDL Cholesterol Cal: 22 mg/dL (ref 5–40)

## 2023-02-03 LAB — HEMOGLOBIN A1C
Est. average glucose Bld gHb Est-mCnc: 140 mg/dL
Hgb A1c MFr Bld: 6.5 % — ABNORMAL HIGH (ref 4.8–5.6)

## 2023-02-03 LAB — CMP14+EGFR
ALT: 13 IU/L (ref 0–32)
AST: 24 IU/L (ref 0–40)
Albumin: 4.4 g/dL (ref 3.8–4.8)
Alkaline Phosphatase: 71 IU/L (ref 44–121)
BUN/Creatinine Ratio: 15 (ref 12–28)
BUN: 14 mg/dL (ref 8–27)
Bilirubin Total: 0.4 mg/dL (ref 0.0–1.2)
CO2: 23 mmol/L (ref 20–29)
Calcium: 9.7 mg/dL (ref 8.7–10.3)
Chloride: 103 mmol/L (ref 96–106)
Creatinine, Ser: 0.95 mg/dL (ref 0.57–1.00)
Globulin, Total: 3.3 g/dL (ref 1.5–4.5)
Glucose: 106 mg/dL — ABNORMAL HIGH (ref 70–99)
Potassium: 4.1 mmol/L (ref 3.5–5.2)
Sodium: 142 mmol/L (ref 134–144)
Total Protein: 7.7 g/dL (ref 6.0–8.5)
eGFR: 63 mL/min/{1.73_m2} (ref >59)

## 2023-02-04 ENCOUNTER — Encounter: Payer: Self-pay | Admitting: Internal Medicine

## 2023-02-13 ENCOUNTER — Other Ambulatory Visit: Payer: Self-pay | Admitting: Internal Medicine

## 2023-02-17 ENCOUNTER — Encounter: Payer: Self-pay | Admitting: Podiatry

## 2023-02-17 ENCOUNTER — Ambulatory Visit (INDEPENDENT_AMBULATORY_CARE_PROVIDER_SITE_OTHER): Payer: PPO | Admitting: Podiatry

## 2023-02-17 DIAGNOSIS — E1142 Type 2 diabetes mellitus with diabetic polyneuropathy: Secondary | ICD-10-CM

## 2023-02-17 DIAGNOSIS — B351 Tinea unguium: Secondary | ICD-10-CM

## 2023-02-17 NOTE — Addendum Note (Signed)
Addended by: Louann Sjogren R on: 02/17/2023 04:34 PM   Modules accepted: Orders, Level of Service

## 2023-02-17 NOTE — Progress Notes (Addendum)
Subjective:  Patient ID: Brooke Brown, female    DOB: 1950-11-16,  MRN: [id number]  Brooke Brown presents to clinic today for diabetic foot check.  . Relates numbness and tingling in their feet. Patient is diabetic and last A1c was  Lab Results  Component Value Date   HGBA1C 6.5 (H) 02/02/2023   .   PCP:  Dorothyann Peng, MD     New problem(s): None.   PCP is Dorothyann Peng, MD , and last visit was May 05, 2021.  No Known Allergies  Review of Systems: Negative except as noted in the HPI. Objective:  Vascular Examination: Vascular status intact b/l with palpable pedal pulses. Pedal hair present b/l. CFT immediate b/l. No edema. No pain with calf compression b/l. Skin temperature gradient WNL b/l.   Neurological Examination: Sensation grossly intact b/l with 10 gram monofilament. Vibratory sensation intact b/l.   Dermatological Examination: Pedal skin with normal turgor, texture and tone b/l. No hyperkeratotic lesions noted b/l. Toenails bilateral great toes and bilateral 5th toes elongated, discolored, dystrophic, thickened, and crumbly with subungual debris and tenderness to dorsal palpation.  Musculoskeletal Examination: Muscle strength 5/5 to b/l LE. No pain, crepitus or joint limitation noted with ROM bilateral LE. No gross bony deformities bilaterally.  Last A1c:      Latest Ref Rng & Units 02/02/2023   10:31 AM 09/29/2022   10:56 AM 06/29/2022   10:38 AM 02/23/2022   11:46 AM  Hemoglobin A1C  Hemoglobin-A1c 4.8 - 5.6 % 6.5  6.7  6.5  6.5     Assessment/Plan: 1. Onychomycosis   2. Type 2 diabetes mellitus with diabetic polyneuropathy, without long-term current use of insulin (HCC)     -Discussed and educated patient on diabetic foot care, especially with  regards to the vascular, neurological and musculoskeletal systems.  -Stressed the importance of good glycemic control and the detriment of not  controlling glucose levels in relation to  the foot. -Discussed supportive shoes at all times and checking feet regularly.  -Discussed getting DM shoes and will schedule appointment  -Answered all patient questions -Patient to return  in 3 months for at risk foot care -Patient advised to call the office if any problems or questions arise in the meantime.  Return in about 1 year (around 02/17/2024) for diabetic foot check.  Louann Sjogren, DPM

## 2023-02-19 ENCOUNTER — Other Ambulatory Visit: Payer: Self-pay

## 2023-03-02 ENCOUNTER — Ambulatory Visit
Admission: RE | Admit: 2023-03-02 | Discharge: 2023-03-02 | Disposition: A | Discharge status: Home / Self Care | Payer: PPO | Source: Ambulatory Visit | Attending: Internal Medicine | Admitting: Internal Medicine

## 2023-03-02 DIAGNOSIS — Z1231 Encounter for screening mammogram for malignant neoplasm of breast: Secondary | ICD-10-CM | POA: Diagnosis not present

## 2023-03-03 ENCOUNTER — Ambulatory Visit: Payer: PPO | Admitting: Internal Medicine

## 2023-03-03 ENCOUNTER — Encounter: Payer: Self-pay | Admitting: Internal Medicine

## 2023-03-03 VITALS — BP 118/74 | HR 71 | Temp 98.1°F | Ht 65.0 in | Wt 146.8 lb

## 2023-03-03 DIAGNOSIS — N182 Chronic kidney disease, stage 2 (mild): Secondary | ICD-10-CM | POA: Diagnosis not present

## 2023-03-03 DIAGNOSIS — I700 Atherosclerosis of aorta: Secondary | ICD-10-CM

## 2023-03-03 DIAGNOSIS — E1122 Type 2 diabetes mellitus with diabetic chronic kidney disease: Secondary | ICD-10-CM | POA: Diagnosis not present

## 2023-03-03 DIAGNOSIS — E2839 Other primary ovarian failure: Secondary | ICD-10-CM

## 2023-03-03 DIAGNOSIS — E890 Postprocedural hypothyroidism: Secondary | ICD-10-CM

## 2023-03-03 DIAGNOSIS — I1310 Hypertensive heart and chronic kidney disease without heart failure, with stage 1 through stage 4 chronic kidney disease, or unspecified chronic kidney disease: Secondary | ICD-10-CM | POA: Diagnosis not present

## 2023-03-03 DIAGNOSIS — Z0000 Encounter for general adult medical examination without abnormal findings: Secondary | ICD-10-CM | POA: Diagnosis not present

## 2023-03-03 DIAGNOSIS — F411 Generalized anxiety disorder: Secondary | ICD-10-CM

## 2023-03-03 MED ORDER — ATORVASTATIN CALCIUM 40 MG PO TABS: ORAL_TABLET | ORAL | 2 refills | Status: DC

## 2023-03-03 NOTE — Patient Instructions (Signed)

## 2023-03-03 NOTE — Progress Notes (Signed)
I,Victoria T Deloria Lair, CMA,acting as a Neurosurgeon for Gwynneth Aliment, MD.,have documented all relevant documentation on the behalf of Gwynneth Aliment, MD,as directed by  Gwynneth Aliment, MD while in the presence of Gwynneth Aliment, MD.  Subjective:  Patient ID: Brooke Brown , female    DOB: [birth year] , 72 y.o.   MRN: [id number]  Chief Complaint  Patient presents with   Annual Exam   Diabetes   Hypertension   Hypothyroidism    HPI  The patient is here today for a physical examination. She is followed by GYN for her pelvic exams. She reports compliance with meds. She denies headaches, chest pain and shortness of breath.   She has no specific concerns or complaints at this time.   Diabetes She presents for her follow-up diabetic visit. She has type 2 diabetes mellitus. Her disease course has been stable. There are no hypoglycemic associated symptoms. Pertinent negatives for diabetes include no blurred vision, no polydipsia, no polyphagia and no polyuria. There are no hypoglycemic complications. Diabetic complications include nephropathy. Risk factors for coronary artery disease include diabetes mellitus, dyslipidemia, hypertension, post-menopausal and sedentary lifestyle. Her weight is stable. She is following a diabetic diet. She participates in exercise three times a week. Her breakfast blood glucose is taken between 8-9 am. Her breakfast blood glucose range is generally 110-130 mg/dl. Eye exam is current.  Hypertension This is a chronic problem. The current episode started more than 1 year ago. The problem has been gradually improving since onset. The problem is controlled. Pertinent negatives include no blurred vision, neck pain or palpitations.     Past Medical History:  Diagnosis Date   Anxiety    Arthritis    Diabetes mellitus    Grave's disease    Grave's disease    Hyperlipemia    Hypertension      Family History  Problem Relation Age of Onset   Pancreatic cancer  Mother    Heart failure Father    Diabetes Brother    Breast cancer Cousin      Current Outpatient Medications:    acetaminophen (TYLENOL) 500 MG tablet, Take 500 mg by mouth daily as needed for moderate pain or headache., Disp: , Rfl:    amLODipine (NORVASC) 5 MG tablet, Take 1 tablet (5 mg total) by mouth daily., Disp: 90 tablet, Rfl: 2   aspirin EC 81 MG tablet, Take 81 mg by mouth daily., Disp: , Rfl:    Blood Glucose Monitoring Suppl (ONE TOUCH ULTRA 2) w/Device KIT, Use to check blood sugar 3 times a day. Dx code e11.65, Disp: 1 kit, Rfl: 2   Carboxymethylcellul-Glycerin (LUBRICATING EYE DROPS OP), Place 1 drop into both eyes 2 (two) times daily., Disp: , Rfl:    empagliflozin (JARDIANCE) 25 MG TABS tablet, Take 1 tablet (25 mg total) by mouth daily before breakfast., Disp: 90 tablet, Rfl: 2   hydrochlorothiazide (HYDRODIURIL) 25 MG tablet, Patient reports taking 1/2 tab in the morning. PER RS., Disp: 45 tablet, Rfl: 2   Magnesium 250 MG TABS, otc (Patient taking differently: Take 250 mg by mouth daily. otc), Disp:  , Rfl: 0   metFORMIN (GLUCOPHAGE-XR) 500 MG 24 hr tablet, One tab po qd, Disp: 90 tablet, Rfl: 2   olmesartan (BENICAR) 40 MG tablet, Take 1 tablet (40 mg total) by mouth daily., Disp: 90 tablet, Rfl: 2   OneTouch Delica Lancets 30G MISC, USE 1  TO CHECK GLUCOSE ONCE DAILY, Disp: 100 each, Rfl: 0  ONETOUCH ULTRA test strip, USE 1 STRIP TO CHECK GLUCOSE ONCE DAILY, Disp: 100 each, Rfl: 0   sertraline (ZOLOFT) 50 MG tablet, Take 1 tablet (50 mg total) by mouth daily., Disp: 90 tablet, Rfl: 2   SYNTHROID 112 MCG tablet, TAKE 1 TABLET MONDAY THROUGH SATURDAY AND SKIP SUNDAYS., Disp: 90 tablet, Rfl: 1   atorvastatin (LIPITOR) 40 MG tablet, TAKE 1 TABLET BY MOUTH Monday through Saturday, skipping Sundays, Disp: 75 tablet, Rfl: 2   No Known Allergies   Review of Systems  Constitutional: Negative.   HENT: Negative.    Eyes: Negative.  Negative for blurred vision.   Respiratory: Negative.    Cardiovascular: Negative.  Negative for palpitations.  Gastrointestinal: Negative.   Endocrine: Negative.  Negative for polydipsia, polyphagia and polyuria.  Genitourinary: Negative.   Musculoskeletal: Negative.  Negative for neck pain.  Skin: Negative.   Allergic/Immunologic: Negative.   Neurological: Negative.   Hematological: Negative.   Psychiatric/Behavioral: Negative.       Today's Vitals   03/03/23 1056  BP: 118/74  Pulse: 71  Temp: 98.1 F (36.7 C)  SpO2: 98%  Weight: 146 lb 12.8 oz (66.6 kg)  Height: 5\' 5"  (1.651 m)   Body mass index is 24.43 kg/m.  Wt Readings from Last 3 Encounters:  03/03/23 146 lb 12.8 oz (66.6 kg)  02/02/23 146 lb 12.8 oz (66.6 kg)  12/01/22 146 lb (66.2 kg)     Objective:  Physical Exam Vitals and nursing note reviewed.  Constitutional:      Appearance: Normal appearance.  HENT:     Head: Normocephalic and atraumatic.     Right Ear: Tympanic membrane, ear canal and external ear normal.     Left Ear: Tympanic membrane, ear canal and external ear normal.     Nose: Nose normal.     Mouth/Throat:     Mouth: Mucous membranes are moist.     Pharynx: Oropharynx is clear.  Eyes:     Extraocular Movements: Extraocular movements intact.     Conjunctiva/sclera: Conjunctivae normal.     Pupils: Pupils are equal, round, and reactive to light.  Cardiovascular:     Rate and Rhythm: Normal rate and regular rhythm.     Pulses: Normal pulses.          Dorsalis pedis pulses are 2+ on the right side and 2+ on the left side.     Heart sounds: Normal heart sounds.  Pulmonary:     Effort: Pulmonary effort is normal.     Breath sounds: Normal breath sounds.  Chest:  Breasts:    Tanner Score is 5.     Right: Normal.     Left: Normal.  Abdominal:     General: Abdomen is flat. Bowel sounds are normal.     Palpations: Abdomen is soft.  Genitourinary:    Comments: deferred Musculoskeletal:        General: Normal range  of motion.     Cervical back: Normal range of motion and neck supple.  Feet:     Right foot:     Protective Sensation: 5 sites tested.  5 sites sensed.     Skin integrity: Skin integrity normal.     Toenail Condition: Right toenails are normal.     Left foot:     Protective Sensation: 5 sites tested.  5 sites sensed.     Skin integrity: Skin integrity normal.     Toenail Condition: Left toenails are normal.  Skin:  General: Skin is warm and dry.  Neurological:     General: No focal deficit present.     Mental Status: She is alert and oriented to person, place, and time.  Psychiatric:        Mood and Affect: Mood normal.        Behavior: Behavior normal.         Assessment And Plan:  Annual physical exam Assessment & Plan: A full exam was performed.  Importance of monthly self breast exams was discussed with the patient.  She is advised to get 30-45 minutes of regular exercise, no less than four to five days per week. Both weight-bearing and aerobic exercises are recommended.  She is advised to follow a healthy diet with at least six fruits/veggies per day, decrease intake of red meat and other saturated fats and to increase fish intake to twice weekly.  Meats/fish should not be fried -- baked, boiled or broiled is preferable. It is also important to cut back on your sugar intake.  Be sure to read labels - try to avoid anything with added sugar, high fructose corn syrup or other sweeteners.  If you must use a sweetener, you can try stevia or monkfruit.  It is also important to avoid artificially sweetened foods/beverages and diet drinks. Lastly, wear SPF 50 sunscreen on exposed skin and when in direct sunlight for an extended period of time.  Be sure to avoid fast food restaurants and aim for at least 60 ounces of water daily.       Type 2 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 disease, without long-term current use of insulin (HCC) Assessment & Plan: Chronic, diabetic foot  exam was performed.  I DISCUSSED WITH THE PATIENT AT LENGTH REGARDING THE GOALS OF GLYCEMIC CONTROL AND POSSIBLE LONG-TERM COMPLICATIONS.  I  ALSO STRESSED THE IMPORTANCE OF COMPLIANCE WITH HOME GLUCOSE MONITORING, DIETARY RESTRICTIONS INCLUDING AVOIDANCE OF SUGARY DRINKS/PROCESSED FOODS,  ALONG WITH REGULAR EXERCISE.  I  ALSO STRESSED THE IMPORTANCE OF ANNUAL EYE EXAMS, SELF FOOT CARE AND COMPLIANCE WITH OFFICE VISITS. She will continue with metformin XR ONCE daily and Jardiance 25mg  daily. I will check renal function today.   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 EKG performed, NSR w/o acute changes.  She will continue with amlodipine 5mg  daily, hydrochlorothiazide 25mg  daily and olmesartan 40mg  daily. She is encouraged to follow low sodium diet. She will f/u in four months.   Orders: -     EKG 12-Lead  Atherosclerosis of aorta (HCC) Assessment & Plan: Chronic, LDL goal is less than 70.  She will continue with atorvastatin 40mg  daily. She is encouraged to follow heart healthy lifestyle.   Orders: -     Lipid panel; Future -     CMP14+EGFR; Future -     Hemoglobin A1c; Future  Postablative hypothyroidism Assessment & Plan: Chronic, currently taking Synthroid daily. I will check thyroid panel and adjust meds as needed.    Estrogen deficiency -     DG Bone Density; Future  Other orders -     Atorvastatin Calcium; TAKE 1 TABLET BY MOUTH Monday through Saturday, skipping Sundays  Dispense: 75 tablet; Refill: 2     Return in 9 weeks (  on 05/05/2023), or lab visit , orders are in, for 1 year physical, mid March - DM check.  Patient was given opportunity to ask questions. Patient verbalized understanding of the plan and was able to repeat key  elements of the plan. All questions were answered to their satisfaction.   I, Gwynneth Aliment, MD, have reviewed all documentation for this visit. The documentation on 03/03/23 for the exam, diagnosis, procedures, and orders are all accurate and complete.   IF YOU HAVE BEEN REFERRED TO A SPECIALIST, IT MAY TAKE 1-2 WEEKS TO SCHEDULE/PROCESS THE REFERRAL. IF YOU HAVE NOT HEARD FROM US/SPECIALIST IN TWO WEEKS, PLEASE GIVE [place] A CALL AT [phone].   THE PATIENT IS ENCOURAGED TO PRACTICE SOCIAL DISTANCING DUE TO THE COVID-19 PANDEMIC.

## 2023-03-13 DIAGNOSIS — Z0000 Encounter for general adult medical examination without abnormal findings: Secondary | ICD-10-CM | POA: Insufficient documentation

## 2023-03-13 NOTE — Assessment & Plan Note (Signed)
Chronic, well controlled. EKG performed, NSR w/o acute changes.  She will continue with amlodipine 5mg  daily, hydrochlorothiazide 25mg  daily and olmesartan 40mg  daily. She is encouraged to follow low sodium diet. She will f/u in four months.

## 2023-03-13 NOTE — Assessment & Plan Note (Signed)
Chronic, LDL goal is less than 70.  She will continue with atorvastatin 40mg  daily. She is encouraged to follow heart healthy lifestyle.

## 2023-03-13 NOTE — Assessment & Plan Note (Signed)

## 2023-03-13 NOTE — Assessment & Plan Note (Signed)
Chronic, currently taking Synthroid daily. I will check thyroid panel and adjust meds as needed.

## 2023-03-13 NOTE — Assessment & Plan Note (Signed)
Chronic, diabetic foot exam was performed.  I DISCUSSED WITH THE PATIENT AT LENGTH REGARDING THE GOALS OF GLYCEMIC CONTROL AND POSSIBLE LONG-TERM COMPLICATIONS.  I  ALSO STRESSED THE IMPORTANCE OF COMPLIANCE WITH HOME GLUCOSE MONITORING, DIETARY RESTRICTIONS INCLUDING AVOIDANCE OF SUGARY DRINKS/PROCESSED FOODS,  ALONG WITH REGULAR EXERCISE.  I  ALSO STRESSED THE IMPORTANCE OF ANNUAL EYE EXAMS, SELF FOOT CARE AND COMPLIANCE WITH OFFICE VISITS. She will continue with metformin XR ONCE daily and Jardiance 25mg  daily. I will check renal function today.

## 2023-03-15 ENCOUNTER — Other Ambulatory Visit: Payer: Self-pay

## 2023-03-15 MED ORDER — SYNTHROID 112 MCG PO TABS: ORAL_TABLET | ORAL | 2 refills | Status: DC

## 2023-03-16 ENCOUNTER — Other Ambulatory Visit: Payer: Self-pay

## 2023-03-16 MED ORDER — SYNTHROID 112 MCG PO TABS: ORAL_TABLET | ORAL | 2 refills | Status: DC

## 2023-03-17 ENCOUNTER — Other Ambulatory Visit: Payer: Self-pay | Admitting: Internal Medicine

## 2023-03-23 ENCOUNTER — Other Ambulatory Visit (INDEPENDENT_AMBULATORY_CARE_PROVIDER_SITE_OTHER): Payer: Self-pay

## 2023-03-23 ENCOUNTER — Encounter: Payer: Self-pay | Admitting: Sports Medicine

## 2023-03-23 ENCOUNTER — Ambulatory Visit: Payer: PPO | Admitting: Sports Medicine

## 2023-03-23 DIAGNOSIS — M19012 Primary osteoarthritis, left shoulder: Secondary | ICD-10-CM | POA: Diagnosis not present

## 2023-03-23 DIAGNOSIS — M1812 Unilateral primary osteoarthritis of first carpometacarpal joint, left hand: Secondary | ICD-10-CM

## 2023-03-23 DIAGNOSIS — M79645 Pain in left finger(s): Secondary | ICD-10-CM

## 2023-03-23 DIAGNOSIS — G8929 Other chronic pain: Secondary | ICD-10-CM

## 2023-03-23 DIAGNOSIS — M25512 Pain in left shoulder: Secondary | ICD-10-CM | POA: Diagnosis not present

## 2023-03-23 MED ORDER — BETAMETHASONE SOD PHOS & ACET 6 (3-3) MG/ML IJ SUSP
6.0000 mg | INTRAMUSCULAR | Status: AC | PRN
  Administered 2023-03-23: 6 mg via INTRA_ARTICULAR

## 2023-03-23 MED ORDER — LIDOCAINE HCL 1 % IJ SOLN
0.5000 mL | INTRAMUSCULAR | Status: AC | PRN
  Administered 2023-03-23: .5 mL

## 2023-03-23 NOTE — Progress Notes (Signed)
Brooke Brown - 72 y.o. female MRN [id number]  Date of birth: 1950/07/11  Office Visit Note: Visit Date: 03/23/2023 PCP: Dorothyann Peng, MD Referred by: Dorothyann Peng, MD  Subjective: Chief Complaint  Patient presents with   Left Hand - Pain   HPI: Brooke Brown is a pleasant 72 y.o. female who presents today for acute on chronic left thumb pain as well as left shoulder pain.  Back on 09/08/2022 we did proceed with ultrasound-guided left CMC joint injection.  She got excellent relief of her pain from this for about 5 months but her thumb pain is starting to become reexacerbated again.  She has also been noticing pain in the shoulder, she is unsure if this is related.  There is pain with laying on that side as well as moving the arm.  She denies any numbness or tingling or radiating pain.  Feels more of a chronic ache.  Pertinent ROS were reviewed with the patient and found to be negative unless otherwise specified above in HPI.   Assessment & Plan: Visit Diagnoses:  1. Primary osteoarthritis of first carpometacarpal joint of left hand   2. Pain of left thumb   3. Chronic left shoulder pain   4. Primary osteoarthritis, left shoulder    Plan: Discussed with Da that she is dealing with an exacerbation of her chronic CMC joint arthritis of the thumb.  Through shared decision-making, we did proceed with repeat [facility] joint injection, patient tolerated well.  Use ice, Tylenol for any postinjection pain over the next 48 to 72 hours.  Also continuing her cool comfort brace.  In terms of her left shoulder, she does have some mild arthritic change but I believe her symptoms are more so emanating from the subacromial bursa/rotator cuff irritation.  Gust formalized physical therapy versus home rehab, she is comfortable with performing rehab exercises on her own at home.  We did print out a customized shoulder rehab program handout and my athletic trainer, Isabelle Course did review  these in the room with her today.  She is to perform these once daily.  I would like to see her back in about 3-4 weeks to see how the thumb is doing and reevaluate the shoulder when she has progressed through her rehab.  Additional considerations: Could consider subacromial joint injection, [place] or MRI of shoulder  Follow-up: Return for f/u in 3-4 weeks for L-shoulder, thumb (30-mins for poss inj).   Meds & Orders: No orders of the defined types were placed in this encounter.   Orders Placed This Encounter  Procedures   Small Joint Inj: L thumb CMC   XR Shoulder Left     Procedures: Small Joint Inj: L thumb CMC on 03/23/2023 2:53 PM Indications: pain Details: 25 G needle, radial approach Medications: 0.5 mL lidocaine 1 %; 6 mg betamethasone acetate-betamethasone sodium phosphate 6 (3-3) MG/ML Outcome: tolerated well, no immediate complications Procedure, treatment alternatives, risks and benefits explained, specific risks discussed. Consent was given by the patient. Immediately prior to procedure a time out was called to verify the correct patient, procedure, equipment, support staff and site/side marked as required. Patient was prepped and draped in the usual sterile fashion.          Clinical History: No specialty comments available.  She reports that she has never smoked. She has never used smokeless tobacco.  Recent Labs    06/29/22 1038 09/29/22 1056 02/02/23 1031  HGBA1C 6.5* 6.7* 6.5*    Objective:  Physical Exam  Gen: Well-appearing, in no acute distress; non-toxic CV:  Well-perfused. Warm.  Resp: Breathing unlabored on room air; no wheezing. Psych: Fluid speech in conversation; appropriate affect; normal thought process Neuro: Sensation intact throughout. No gross coordination deficits.   Ortho Exam - Left thumb: Positive CMC grind test, negative Finkelstein's test.  There is some bony bossing, likely from underlying arthritic change.  Cap refill less than 2  seconds.  - Left shoulder: Very mild TTP over the [facility] joint.  There is full active and passive range of motion.  There is 5/5 strength with rotator cuff testing.  Mild pain with empty can and resisted abduction without notable weakness.  There is some pain with endrange internal rotation.  Imaging:  XR Shoulder Left Complete 3 views of the left shoulder including Grashey, AP, scapular Y  and axial view were ordered and reviewed by myself.  X-rays demonstrate  mild, age-appropriate AC joint and glenohumeral arthritis.  The humeral  head is well located.  No acute fracture or otherwise bony abnormality  noted.   *XR L-thumb 09/08/22: X-rays of her left thumb were obtained today.  No acute fractures  well-maintained alignment she does have advanced degenerative changes of  the [facility] joint of the thumb   *3 view x-ray of the left thumb from 09/08/2022 was independently interpreted by myself today.  X-rays show moderate to severe arthritic change about the [facility] joint and some degree of STT arthritic change.  There is periarticular spurring of the proximal aspect of the thumb joint at the [facility] level.  No acute fracture noted.  Past Medical/Family/Surgical/Social History: Medications & Allergies reviewed per EMR, new medications updated. 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   Pain of left thumb 09/08/2022   Hair loss 06/29/2022   Chronic renal disease, stage II 01/08/2022   Unilateral primary osteoarthritis, right knee 12/09/2021   Generalized anxiety disorder 11/19/2021   High vitamin D level 11/19/2021   Pain in right knee 09/19/2021   Colon cancer screening 07/30/2021   Flatulence, eructation and gas pain 07/30/2021   Gastroesophageal reflux disease 07/30/2021   History of colonic polyps 07/30/2021   Right lower quadrant pain 07/30/2021   Constipation 07/30/2021    Osteoarthritis of carpometacarpal (CMC) joint of thumb 03/04/2021   Bilateral hand pain 03/04/2021   Atherosclerosis of aorta (HCC) 08/15/2020   Anosmia 02/27/2020   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 02/27/2020   Hypothyroidism 02/27/2020   Other long term (current) drug therapy 02/27/2020   BMI 25.0-25.9,adult 12/26/2019   Vitamin D deficiency disease 12/26/2019   Pure hypercholesterolemia 03/29/2019   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   Overweight (BMI 25.0-29.9) 03/29/2019   Right wrist pain 09/30/2015   Essential hypertension 12/20/2013   Other and unspecified hyperlipidemia 12/20/2013   Type 2 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 disease, without long-term current use of insulin (HCC) 12/20/2013   Neuralgia 12/20/2013   Neck pain 12/20/2013   Dizziness 12/20/2013   Past Medical History:  Diagnosis Date   Anxiety    Arthritis    Diabetes mellitus    Grave's disease    Grave's disease    Hyperlipemia    Hypertension    Family History  Problem Relation Age of Onset   Pancreatic cancer Mother    Heart failure Father    Diabetes Brother    Breast  cancer Cousin    Past Surgical History:  Procedure Laterality Date   ABDOMINAL HYSTERECTOMY     CATARACT EXTRACTION W/ INTRAOCULAR LENS  IMPLANT, BILATERAL Bilateral    CHOLECYSTECTOMY N/A 08/16/2017   Procedure: LAPAROSCOPIC CHOLECYSTECTOMY;  Surgeon: Jimmye Norman, MD;  Location: [facility] OR;  Service: General;  Laterality: N/A;   TUBAL LIGATION     Social History   Occupational History   Occupation: retired   Occupation: Print production planner  Tobacco Use   Smoking status: Never   Smokeless tobacco: Never  Vaping Use   Vaping status: Never Used  Substance and Sexual Activity   Alcohol use: Not Currently    Alcohol/week: 0.0 standard drinks of alcohol    Comment: rarely   Drug use: No   Sexual activity: Not Currently

## 2023-03-23 NOTE — Progress Notes (Signed)
Patient says that she had 4-5 months of relief from the last injection, but that she is having pain again. She says this pain initially felt similar to the pain she had prior to the last injection, but in the last month she has noticed it is gradually moving up her arm to her shoulder. She says that this feels like more of an ache, and that she has not noticed any sharp or shooting pain, or numbness/tingling.   Patient was instructed in 10 minutes of therapeutic exercises for left shoulder to improve strength, ROM and function according to my instructions and plan of care by a Certified Athletic Trainer during the office visit. A customized handout was provided and demonstration of proper technique shown and discussed. Patient did perform exercises and demonstrate understanding through teachback.  All questions discussed and answered.

## 2023-04-13 ENCOUNTER — Other Ambulatory Visit: Payer: Self-pay | Admitting: Internal Medicine

## 2023-04-13 MED ORDER — FLUCONAZOLE 100 MG PO TABS: ORAL_TABLET | ORAL | 0 refills | Status: DC

## 2023-04-14 ENCOUNTER — Other Ambulatory Visit: Payer: Self-pay | Admitting: Internal Medicine

## 2023-04-20 ENCOUNTER — Ambulatory Visit: Payer: PPO | Admitting: Sports Medicine

## 2023-04-21 ENCOUNTER — Other Ambulatory Visit: Payer: Self-pay

## 2023-04-21 MED ORDER — ATORVASTATIN CALCIUM 40 MG PO TABS: ORAL_TABLET | ORAL | 1 refills | Status: DC

## 2023-04-26 ENCOUNTER — Ambulatory Visit: Payer: PPO | Admitting: Sports Medicine

## 2023-05-05 ENCOUNTER — Other Ambulatory Visit: Payer: PPO

## 2023-05-05 DIAGNOSIS — E1122 Type 2 diabetes mellitus with diabetic chronic kidney disease: Secondary | ICD-10-CM | POA: Diagnosis not present

## 2023-05-05 DIAGNOSIS — N182 Chronic kidney disease, stage 2 (mild): Secondary | ICD-10-CM | POA: Diagnosis not present

## 2023-05-05 DIAGNOSIS — I700 Atherosclerosis of aorta: Secondary | ICD-10-CM | POA: Diagnosis not present

## 2023-05-05 LAB — CMP14+EGFR
ALT: 14 [IU]/L (ref 0–32)
AST: 25 [IU]/L (ref 0–40)
Albumin: 4.1 g/dL (ref 3.8–4.8)
Alkaline Phosphatase: 70 [IU]/L (ref 44–121)
BUN/Creatinine Ratio: 14 (ref 12–28)
BUN: 12 mg/dL (ref 8–27)
Bilirubin Total: 0.5 mg/dL (ref 0.0–1.2)
CO2: 23 mmol/L (ref 20–29)
Calcium: 9.3 mg/dL (ref 8.7–10.3)
Chloride: 102 mmol/L (ref 96–106)
Creatinine, Ser: 0.86 mg/dL (ref 0.57–1.00)
Globulin, Total: 2.9 g/dL (ref 1.5–4.5)
Glucose: 95 mg/dL (ref 70–99)
Potassium: 4.3 mmol/L (ref 3.5–5.2)
Sodium: 141 mmol/L (ref 134–144)
Total Protein: 7 g/dL (ref 6.0–8.5)
eGFR: 71 mL/min/{1.73_m2} (ref >59)

## 2023-05-05 LAB — LIPID PANEL
Chol/HDL Ratio: 3 {ratio} (ref 0.0–4.4)
Cholesterol, Total: 160 mg/dL (ref 100–199)
HDL: 53 mg/dL (ref >39)
LDL Chol Calc (NIH): 89 mg/dL (ref 0–99)
Triglycerides: 100 mg/dL (ref 0–149)
VLDL Cholesterol Cal: 18 mg/dL (ref 5–40)

## 2023-05-05 LAB — HEMOGLOBIN A1C
Est. average glucose Bld gHb Est-mCnc: 143 mg/dL
Hgb A1c MFr Bld: 6.6 % — ABNORMAL HIGH (ref 4.8–5.6)

## 2023-05-09 ENCOUNTER — Encounter: Payer: Self-pay | Admitting: Internal Medicine

## 2023-05-17 ENCOUNTER — Ambulatory Visit: Payer: PPO | Admitting: Sports Medicine

## 2023-05-17 DIAGNOSIS — M79645 Pain in left finger(s): Secondary | ICD-10-CM | POA: Diagnosis not present

## 2023-05-17 DIAGNOSIS — G8929 Other chronic pain: Secondary | ICD-10-CM

## 2023-05-17 DIAGNOSIS — M25512 Pain in left shoulder: Secondary | ICD-10-CM | POA: Diagnosis not present

## 2023-05-17 DIAGNOSIS — M1812 Unilateral primary osteoarthritis of first carpometacarpal joint, left hand: Secondary | ICD-10-CM

## 2023-05-17 DIAGNOSIS — M79644 Pain in right finger(s): Secondary | ICD-10-CM

## 2023-05-17 NOTE — Progress Notes (Unsigned)
Brooke Brown - 72 y.o. female MRN [id number]  Date of birth: [birth year]  Office Visit Note: Visit Date: 05/17/2023 PCP: Dorothyann Peng, MD Referred by: Dorothyann Peng, MD  Subjective: Chief Complaint  Patient presents with  . Left Hand - Pain   HPI: Brooke Brown is a pleasant 72 y.o. female who presents today for follow-up of left CMC thumb OA/pain and left shoulder pain.  Left shoulder -she had been quite consistent in the first 2-3 weeks with the home exercise protocol, she has been finding relief from this but still has pain, not everyday however.   Left thumb -   Pertinent ROS were reviewed with the patient and found to be negative unless otherwise specified above in HPI.   Assessment & Plan: Visit Diagnoses: No diagnosis found.  Plan: ***  Follow-up: No follow-ups on file.   Meds & Orders: No orders of the defined types were placed in this encounter.  No orders of the defined types were placed in this encounter.    Procedures: No procedures performed      Clinical History: No specialty comments available.  She reports that she has never smoked. She has never used smokeless tobacco.  Recent Labs    09/29/22 1056 02/02/23 1031 05/05/23 1026  HGBA1C 6.7* 6.5* 6.6*    Objective:   Vital Signs: There were no vitals taken for this visit.  Physical Exam  Gen: Well-appearing, in no acute distress; non-toxic CV: Regular Rate. Well-perfused. Warm.  Resp: Breathing unlabored on room air; no wheezing. Psych: Fluid speech in conversation; appropriate affect; normal thought process Neuro: Sensation intact throughout. No gross coordination deficits.   Ortho Exam - ***  Imaging: No results found.  Past Medical/Family/Surgical/Social History: Medications & Allergies reviewed per EMR, new medications updated. Patient Active Problem List   Diagnosis Date Noted  . Annual physical exam 03/13/2023  . 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 Hypertensive  heart and renal disease 02/02/2023  . Pain of left thumb 09/08/2022  . Hair loss 06/29/2022  . Chronic renal disease, stage II 01/08/2022  . Unilateral primary osteoarthritis, right knee 12/09/2021  . Generalized anxiety disorder 11/19/2021  . High vitamin D level 11/19/2021  . Pain in right knee 09/19/2021  . Colon cancer screening 07/30/2021  . Flatulence, eructation and gas pain 07/30/2021  . Gastroesophageal reflux disease 07/30/2021  . History of colonic polyps 07/30/2021  . Right lower quadrant pain 07/30/2021  . Constipation 07/30/2021  . Osteoarthritis of carpometacarpal (CMC) joint of thumb 03/04/2021  . Bilateral hand pain 03/04/2021  . Atherosclerosis of aorta (HCC) 08/15/2020  . 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 Hypothyroidism 02/27/2020  . Other long term (current) drug therapy 02/27/2020  . BMI 25.0-25.9,adult 12/26/2019  . Vitamin D deficiency disease 12/26/2019  . Pure hypercholesterolemia 03/29/2019  . 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 Overweight (BMI 25.0-29.9) 03/29/2019  . Right wrist pain 09/30/2015  . Essential hypertension 12/20/2013  . Other and unspecified hyperlipidemia 12/20/2013  . Type 2 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 disease, without long-term current use of insulin (HCC) 12/20/2013  . Neuralgia 12/20/2013  . Neck pain 12/20/2013  . Dizziness 12/20/2013   Past Medical History:  Diagnosis Date  . Anxiety   . Arthritis   . Diabetes mellitus   . Grave's disease   . Grave's disease   . Hyperlipemia   . Hypertension    Family History  Problem Relation Age of Onset  .  Pancreatic cancer Mother   . Heart failure Father   . Diabetes Brother   . Breast cancer Cousin    Past Surgical History:  Procedure Laterality Date  . ABDOMINAL HYSTERECTOMY    . CATARACT EXTRACTION W/ INTRAOCULAR LENS  IMPLANT, BILATERAL Bilateral   . CHOLECYSTECTOMY N/A 08/16/2017    Procedure: LAPAROSCOPIC CHOLECYSTECTOMY;  Surgeon: Jimmye Norman, MD;  Location: [facility] OR;  Service: General;  Laterality: N/A;  . TUBAL LIGATION     Social History   Occupational History  . Occupation: retired  . Occupation: Print production planner  Tobacco Use  . Smoking status: Never  . Smokeless tobacco: Never  Vaping Use  . Vaping status: Never Used  Substance and Sexual Activity  . Alcohol use: Not Currently    Alcohol/week: 0.0 standard drinks of alcohol    Comment: rarely  . Drug use: No  . Sexual activity: Not Currently

## 2023-05-17 NOTE — Progress Notes (Unsigned)
Patient says that the left thumb is feeling better than it was prior to the injection, but is still painful at times. She notices that the weather seems to contribute to that as well. She says that she wears the brace and was doing her exercises daily, but that she got out of that routine due to things happening in the family. She also mentions that the right thumb is beginning to feel painful, similar to the left. She says that she may consider injection into the right side in the future.  Patient says that the shoulder does not give her pain everyday but that she does still have pain.

## 2023-05-18 ENCOUNTER — Encounter: Payer: Self-pay | Admitting: Sports Medicine

## 2023-06-01 ENCOUNTER — Other Ambulatory Visit: Payer: Self-pay | Admitting: Internal Medicine

## 2023-06-01 MED ORDER — FLUCONAZOLE 100 MG PO TABS: ORAL_TABLET | ORAL | 0 refills | Status: DC

## 2023-06-08 ENCOUNTER — Other Ambulatory Visit: Payer: Self-pay | Admitting: Internal Medicine

## 2023-06-10 ENCOUNTER — Other Ambulatory Visit: Payer: Self-pay | Admitting: Internal Medicine

## 2023-06-14 ENCOUNTER — Other Ambulatory Visit: Payer: Self-pay

## 2023-06-14 MED ORDER — ATORVASTATIN CALCIUM 40 MG PO TABS: ORAL_TABLET | ORAL | 2 refills | Status: DC

## 2023-06-17 ENCOUNTER — Other Ambulatory Visit (INDEPENDENT_AMBULATORY_CARE_PROVIDER_SITE_OTHER): Payer: PPO

## 2023-06-17 ENCOUNTER — Ambulatory Visit: Payer: PPO | Admitting: Sports Medicine

## 2023-06-17 ENCOUNTER — Encounter: Payer: Self-pay | Admitting: Sports Medicine

## 2023-06-17 ENCOUNTER — Other Ambulatory Visit: Payer: Self-pay

## 2023-06-17 DIAGNOSIS — M79645 Pain in left finger(s): Secondary | ICD-10-CM | POA: Diagnosis not present

## 2023-06-17 DIAGNOSIS — G8929 Other chronic pain: Secondary | ICD-10-CM

## 2023-06-17 DIAGNOSIS — M1811 Unilateral primary osteoarthritis of first carpometacarpal joint, right hand: Secondary | ICD-10-CM

## 2023-06-17 DIAGNOSIS — M25512 Pain in left shoulder: Secondary | ICD-10-CM

## 2023-06-17 DIAGNOSIS — M180 Bilateral primary osteoarthritis of first carpometacarpal joints: Secondary | ICD-10-CM

## 2023-06-17 DIAGNOSIS — M79644 Pain in right finger(s): Secondary | ICD-10-CM

## 2023-06-17 MED ORDER — BETAMETHASONE SOD PHOS & ACET 6 (3-3) MG/ML IJ SUSP
6.0000 mg | INTRAMUSCULAR | Status: AC | PRN
  Administered 2023-06-17: 6 mg via INTRA_ARTICULAR

## 2023-06-17 MED ORDER — LIDOCAINE HCL 1 % IJ SOLN
0.5000 mL | INTRAMUSCULAR | Status: AC | PRN
  Administered 2023-06-17: .5 mL

## 2023-06-17 NOTE — Progress Notes (Signed)
Patient says that her left shoulder is feeling much better. She says that she occasionally has some pain and soreness in the arm when she exercises, but she does not have pain in the top of the shoulder anymore.  As far as the thumbs, patient says that the right is worse than the left. She says that the right thumb almost always throbs. She has been wearing her brace on that side. She says that the left feels okay. She says it is a pretty constant ache but is bearable. She also describes the left as feeling sore.

## 2023-06-17 NOTE — Progress Notes (Signed)
Brooke Brown - 73 y.o. female MRN [id number]  Date of birth: [birth year]  Office Visit Note: Visit Date: 06/17/2023 PCP: Dorothyann Peng, MD Referred by: Dorothyann Peng, MD  Subjective: Chief Complaint  Patient presents with   Right Hand - Follow-up   Left Hand - Follow-up   Left Shoulder - Follow-up   HPI: Brooke Brown is a pleasant 73 y.o. female who presents today for f/u of b/l CMC thumb OA/pain and chronic left shoulder pain.  Left shoulder -the left shoulder is feeling much better.  Clearly notices the pain and nothing that stops her from daily activity.  She continues with her HEP and her exercises with mild soreness but certainly improved.  Bilateral thumbs -she has known osteoarthritis at the [facility] joint of both thumbs.  Back last year in April we did inject the left thumb which gave her good relief of her symptoms.  Here over the last few weeks her right thumb has been very bothersome.  Right at the base of the thumb joint.  She is right-hand dominant.  She does use topical Voltaren gel and her thumb brace with some relief.  She is type-II diabetic, but well controlled. Lab Results  Component Value Date   HGBA1C 6.6 (H) 05/05/2023   Pertinent ROS were reviewed with the patient and found to be negative unless otherwise specified above in HPI.   Assessment & Plan: Visit Diagnoses:  1. 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 Bilateral thumb pain   3. Chronic left shoulder pain    -2+ chronic conditions with exacerbation, 3+ imaging/labs (data) review  Plan: Impression is bilateral thumb CMC arthritis which is chronic for a right thumb acute exacerbation.  Through shared decision-making, we did proceed with ultrasound-guided [facility] joint injection which patient tolerated well.  She is diabetic, but very well-controlled -inform of transient glucose elevation from injection.  She will remain in her thumb brace for the next 48 hours  as well as apply ice and topical Voltaren gel, Tylenol as needed.  She may use her thumb braces for both thumbs as needed after this. Her left shoulder is much improved and she has improved strength and stability of her rotator cuff, she will continue HEP and exercises as she desires.  We will follow-up for these conditions in about 3 months, she may call or return sooner if any issues arise.  Follow-up: Return in about 3 months (around 09/15/2023) for for b/l thumbs, L-shoulder (30-min).   Meds & Orders: No orders of the defined types were placed in this encounter.   Orders Placed This Encounter  Procedures   Small Joint Inj: R thumb CMC   XR Finger Thumb Right   US Guided Needle Placement - No Linked Charges     Procedures: Small Joint Inj: R thumb CMC on 06/17/2023 11:16 AM Indications: pain Details: 25 G needle, ultrasound-guided radial approach Medications: 0.5 mL lidocaine 1 %; 6 mg betamethasone acetate-betamethasone sodium phosphate 6 (3-3) MG/ML Outcome: tolerated well, no immediate complications  Procedure: Ultrasound-guided CMC joint injection, Right thumb After informed verbal consent was obtained a timeout was performed, patient was seated on exam table. The patient's right hand was placed on stable surface with medial hand on table.  Identification of the [facility] joint was performed in long axis under ultrasound guidance. Utilizing an in-plane approach, a 25-gauge, 5/8" needle was inserted under ultrasound guidance utilizing walk-down technique and visualized with needle entrance into the [facility] joint. The  joint was subsequently injected with a 0.5:0.5 cc of lidocaine:depomedrol combination with physical visitation of injectate spread into the joint space.  Patient tolerated the procedure well without immediate complications.  Procedure, treatment alternatives, risks and benefits explained, specific risks discussed. Consent was given by the patient. Immediately prior to procedure a time  out was called to verify the correct patient, procedure, equipment, support staff and site/side marked as required. Patient was prepped and draped in the usual sterile fashion.          Clinical History: No specialty comments available.  She reports that she has never smoked. She has never used smokeless tobacco.  Recent Labs    09/29/22 1056 02/02/23 1031 05/05/23 1026  HGBA1C 6.7* 6.5* 6.6*    Objective:    Physical Exam  Gen: Well-appearing, in no acute distress; non-toxic CV: Well-perfused. Warm.  Resp: Breathing unlabored on room air; no wheezing. Psych: Fluid speech in conversation; appropriate affect; normal thought process  Ortho Exam - Left shoulder: No AC joint or bony TTP.  There is about 8-10 degrees passive forward flexion secondary to pain, I am able to take her to nearly full range before pain.  There is improvement in rotator cuff testing with no gross weakness on examination today.  There is about 10 degrees less of external rotation at the 90/90 position.  - Bilateral thumbs: + CMC grind test on the right, negative on the left.  There is no laxity with UCL/RCL testing.  No effusions noted.  Negative Finkelstein's.  Imaging: XR Finger Thumb Right Result Date: 06/17/2023 Full hysterectomy including AP, lateral, oblique and [place]ilt view was ordered and reviewed by myself.  There is moderate osteoarthritic change about the [facility] joint, worse on the radial component. The IP joint of the thumb appears intact.  There is mild age-appropriate arthritic change about the hand and the wrist.  No acute fracture or otherwise acute bony abnormality noted.  *I did independently reviewed both the left shoulder x-ray as well as the left and right thumb x-rays as above and below today.  - 03/23/23: Complete 3 views of the left shoulder including Grashey, AP, scapular Y  and axial view were ordered and reviewed by myself.  X-rays demonstrate  mild, age-appropriate AC joint and  glenohumeral arthritis.  The humeral  head is well located.  No acute fracture or otherwise bony abnormality  noted.   - 09/08/22: X-rays of her left thumb were obtained today.  No acute fractures  well-maintained alignment she does have advanced degenerative changes of  the [facility] joint of the thumb   Past Medical/Family/Surgical/Social History: Medications & Allergies reviewed per EMR, new medications updated. 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   Pain of left thumb 09/08/2022   Hair loss 06/29/2022   Chronic renal disease, stage II 01/08/2022   Unilateral primary osteoarthritis, right knee 12/09/2021   Generalized anxiety disorder 11/19/2021   High vitamin D level 11/19/2021   Pain in right knee 09/19/2021   Colon cancer screening 07/30/2021   Flatulence, eructation and gas pain 07/30/2021   Gastroesophageal reflux disease 07/30/2021   History of colonic polyps 07/30/2021   Right lower quadrant pain 07/30/2021   Constipation 07/30/2021   Osteoarthritis of carpometacarpal (CMC) joint of thumb 03/04/2021   Bilateral hand pain 03/04/2021   Atherosclerosis of aorta (HCC) 08/15/2020   Anosmia 02/27/2020   Glomerular  disorders in diseases classified elsewhere 02/27/2020   Hypothyroidism 02/27/2020   Other long term (current) drug therapy 02/27/2020   BMI 25.0-25.9,adult 12/26/2019   Vitamin D deficiency disease 12/26/2019   Pure hypercholesterolemia 03/29/2019   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   Overweight (BMI 25.0-29.9) 03/29/2019   Right wrist pain 09/30/2015   Essential hypertension 12/20/2013   Other and unspecified hyperlipidemia 12/20/2013   Type 2 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 disease, without long-term current use of insulin (HCC) 12/20/2013   Neuralgia 12/20/2013   Neck pain 12/20/2013   Dizziness 12/20/2013   Past  Medical History:  Diagnosis Date   Anxiety    Arthritis    Diabetes mellitus    Grave's disease    Grave's disease    Hyperlipemia    Hypertension    Family History  Problem Relation Age of Onset   Pancreatic cancer Mother    Heart failure Father    Diabetes Brother    Breast cancer Cousin    Past Surgical History:  Procedure Laterality Date   ABDOMINAL HYSTERECTOMY     CATARACT EXTRACTION W/ INTRAOCULAR LENS  IMPLANT, BILATERAL Bilateral    CHOLECYSTECTOMY N/A 08/16/2017   Procedure: LAPAROSCOPIC CHOLECYSTECTOMY;  Surgeon: Jimmye Norman, MD;  Location: [facility] OR;  Service: General;  Laterality: N/A;   TUBAL LIGATION     Social History   Occupational History   Occupation: retired   Occupation: Print production planner  Tobacco Use   Smoking status: Never   Smokeless tobacco: Never  Vaping Use   Vaping status: Never Used  Substance and Sexual Activity   Alcohol use: Not Currently    Alcohol/week: 0.0 standard drinks of alcohol    Comment: rarely   Drug use: No   Sexual activity: Not Currently

## 2023-06-18 ENCOUNTER — Other Ambulatory Visit: Payer: Self-pay | Admitting: Internal Medicine

## 2023-06-30 ENCOUNTER — Ambulatory Visit: Payer: HMO

## 2023-06-30 VITALS — BP 118/60 | HR 82 | Temp 98.0°F | Ht 65.0 in | Wt 146.0 lb

## 2023-06-30 DIAGNOSIS — Z0000 Encounter for general adult medical examination without abnormal findings: Secondary | ICD-10-CM | POA: Diagnosis not present

## 2023-06-30 NOTE — Progress Notes (Signed)
 Subjective:   Brooke Brown is a 73 y.o. female who presents for Medicare Annual (Subsequent) preventive examination.  Visit Complete: In person  Patient Medicare AWV questionnaire was completed by the patient on 06/23/2023; I have confirmed that all information answered by patient is correct and no changes since this date.  Cardiac Risk Factors include: advanced age (>15men, >68 women);diabetes mellitus;hypertension     Objective:    Today's Vitals   06/30/23 0819  BP: 118/60  Pulse: 82  Temp: 98 F (36.7 C)  TempSrc: Oral  SpO2: 96%  Weight: 146 lb (66.2 kg)  Height: 5' 5 (1.651 m)   Body mass index is 24.3 kg/m.     06/30/2023    8:27 AM 06/18/2022   12:00 PM 03/01/2022   12:22 PM 06/12/2021    9:52 AM 05/28/2021    9:09 AM 08/29/2020    3:31 PM 08/10/2019   10:08 AM  Advanced Directives  Does Patient Have a Medical Advance Directive? Yes No Yes No No Yes No  Type of Advance Directive Living will  Living will   Living will   Would patient like information on creating a medical advance directive?  No - Patient declined   Yes (MAU/Ambulatory/Procedural Areas - Information given)  No - Patient declined    Current Medications (verified) Outpatient Encounter Medications as of 06/30/2023  Medication Sig   acetaminophen  (TYLENOL ) 500 MG tablet Take 500 mg by mouth daily as needed for moderate pain or headache.   amLODipine  (NORVASC ) 5 MG tablet Take 1 tablet by mouth once daily   aspirin EC 81 MG tablet Take 81 mg by mouth daily.   atorvastatin  (LIPITOR) 40 MG tablet TAKE 1 TABLET BY MOUTH ON SUNDAY- SATURDAY.   Blood Glucose Monitoring Suppl (ONE TOUCH ULTRA 2) w/Device KIT Use to check blood sugar 3 times a day. Dx code e11.65   Carboxymethylcellul-Glycerin (LUBRICATING EYE DROPS OP) Place 1 drop into both eyes 2 (two) times daily.   fluconazole  (DIFLUCAN ) 100 MG tablet One tab po today , repeat in 48 hours if needed   hydrochlorothiazide  (HYDRODIURIL ) 25 MG  tablet Patient reports taking 1/2 tab in the morning. PER RS.   Magnesium  250 MG TABS otc (Patient taking differently: Take 250 mg by mouth daily. otc)   metFORMIN  (GLUCOPHAGE -XR) 500 MG 24 hr tablet Take 1 tablet by mouth twice daily   olmesartan  (BENICAR ) 40 MG tablet Take 1 tablet (40 mg total) by mouth daily.   OneTouch Delica Lancets 30G MISC USE 1 TO CHECK GLUCOSE ONCE DAILY   ONETOUCH ULTRA test strip USE 1 STRIP TO CHECK GLUCOSE ONCE DAILY   sertraline  (ZOLOFT ) 50 MG tablet Take 1 tablet (50 mg total) by mouth daily.   SYNTHROID  112 MCG tablet TAKE 1 TABLET MONDAY THROUGH SATURDAY AND SKIP SUNDAYS.   empagliflozin  (JARDIANCE ) 25 MG TABS tablet Take 1 tablet (25 mg total) by mouth daily before breakfast.   No facility-administered encounter medications on file as of 06/30/2023.    Allergies (verified) Patient has no known allergies.   History: Past Medical History:  Diagnosis Date   Anxiety    Arthritis    Diabetes mellitus    Grave's disease    Grave's disease    Hyperlipemia    Hypertension    Past Surgical History:  Procedure Laterality Date   ABDOMINAL HYSTERECTOMY     CATARACT EXTRACTION W/ INTRAOCULAR LENS  IMPLANT, BILATERAL Bilateral    CHOLECYSTECTOMY N/A 08/16/2017   Procedure: LAPAROSCOPIC  CHOLECYSTECTOMY;  Surgeon: Kimble Agent, MD;  Location: [facility] OR;  Service: General;  Laterality: N/A;   EYE SURGERY  03/2017   Catarets removed   TUBAL LIGATION     Family History  Problem Relation Age of Onset   Pancreatic cancer Mother    Cancer Mother    Hypertension Mother    Heart failure Father    Hypertension Father    Diabetes Brother    Breast cancer Cousin    Social History   Socioeconomic History   Marital status: Married    Spouse name: Not on file   Number of children: 1   Years of education: Not on file   Highest education level: Some college, no degree  Occupational History   Occupation: retired   Occupation: Print Production Planner   Tobacco Use   Smoking status: Never   Smokeless tobacco: Never  Vaping Use   Vaping status: Never Used  Substance and Sexual Activity   Alcohol use: Not Currently    Comment: rarely   Drug use: No   Sexual activity: Not Currently    Birth control/protection: None  Other Topics Concern   Not on file  Social History Narrative   Not on file   Social Drivers of Health   Financial Resource Strain: Low Risk  (06/30/2023)   Overall Financial Resource Strain (CARDIA)    Difficulty of Paying Living Expenses: Not hard at all  Food Insecurity: No Food Insecurity (06/30/2023)   Hunger Vital Sign    Worried About Running Out of Food in the Last Year: Never true    Ran Out of Food in the Last Year: Never true  Transportation Needs: No Transportation Needs (06/30/2023)   PRAPARE - Administrator, Civil Service (Medical): No    Lack of Transportation (Non-Medical): No  Physical Activity: Insufficiently Active (06/30/2023)   Exercise Vital Sign    Days of Exercise per Week: 3 days    Minutes of Exercise per Session: 40 min  Stress: Stress Concern Present (06/30/2023)   [employer] of Occupational Health - Occupational Stress Questionnaire    Feeling of Stress : To some extent  Social Connections: Socially Integrated (06/30/2023)   Social Connection and Isolation Panel [NHANES]    Frequency of Communication with Friends and Family: Three times a week    Frequency of Social Gatherings with Friends and Family: Once a week    Attends Religious Services: More than 4 times per year    Active Member of [employer] or Organizations: Yes    Attends Engineer, Structural: More than 4 times per year    Marital Status: Married    Tobacco Counseling Counseling given: Not Answered   Clinical Intake:  Pre-visit preparation completed: Yes  Pain : No/denies pain     Nutritional Status: BMI of 19-24  Normal Nutritional Risks: None Diabetes: Yes CBG done?: No Did pt. bring in CBG  monitor from home?: No  How often do you need to have someone help you when you read instructions, pamphlets, or other written materials from your doctor or pharmacy?: 1 - Never  Interpreter Needed?: No  Information entered by :: NAllen LPN   Activities of Daily Living    06/23/2023    9:56 AM  In your present state of health, do you have any difficulty performing the following activities:  Hearing? 0  Vision? 0  Difficulty concentrating or making decisions? 0  Walking or climbing stairs? 0  Dressing or bathing?  0  Doing errands, shopping? 0  Preparing Food and eating ? N  Using the Toilet? N  In the past six months, have you accidently leaked urine? Y  Comment if held too long  Do you have problems with loss of bowel control? N  Managing your Medications? N  Managing your Finances? N  Housekeeping or managing your Housekeeping? N    Patient Care Team: Jarold Medici, MD as PCP - General (Internal Medicine) Verlin Lonni BIRCH, MD as PCP - Cardiology (Cardiology) Morgan, Clayborne CROME, RN as Case Manager Pearson, Vallie J, RPH (Inactive) (Pharmacist) [facility]  Indicate any recent Medical Services you may have received from other than [facility] providers in the past year (date may be approximate).     Assessment:   This is a routine wellness examination for Shalae.  Hearing/Vision screen Hearing Screening - Comments:: Denies hearing issues Vision Screening - Comments:: Regular eye exams, [facility]   Goals Addressed             This Visit's Progress    Patient Stated       06/30/2023, wants to eat healthier       Depression Screen    06/30/2023    8:28 AM 03/03/2023   10:57 AM 11/16/2022    8:32 AM 09/29/2022   10:20 AM 06/29/2022    9:41 AM 06/18/2022   12:01 PM 03/03/2022    2:22 PM  PHQ 2/9 Scores  PHQ - 2 Score 0 3 0 0 0 0 0  PHQ- 9 Score 2 8 2  0       Fall Risk    06/23/2023    9:56 AM 03/03/2023   10:56 AM 02/02/2023    9:27 AM  11/16/2022    8:32 AM 09/29/2022   10:21 AM  Fall Risk   Falls in the past year? 1 1 1  0 0  Comment foot stuck      Number falls in past yr: 0 0 0 0 0  Injury with Fall? 0 0 0 0 0  Risk for fall due to : Medication side effect No Fall Risks History of fall(s) No Fall Risks No Fall Risks  Follow up Falls prevention discussed;Falls evaluation completed Falls evaluation completed Falls evaluation completed Falls evaluation completed Falls evaluation completed    MEDICARE RISK AT HOME: Medicare Risk at Home Any stairs in or around the home?: (Patient-Rptd) Yes If so, are there any without handrails?: (Patient-Rptd) No Home free of loose throw rugs in walkways, pet beds, electrical cords, etc?: (Patient-Rptd) Yes Adequate lighting in your home to reduce risk of falls?: (Patient-Rptd) Yes Life alert?: (Patient-Rptd) No Use of a cane, walker or w/c?: (Patient-Rptd) No Grab bars in the bathroom?: (Patient-Rptd) Yes Shower chair or bench in shower?: (Patient-Rptd) Yes Elevated toilet seat or a handicapped toilet?: (Patient-Rptd) Yes  TIMED UP AND GO:  Was the test performed?  Yes  Length of time to ambulate 10 feet: 5 sec Gait steady and fast without use of assistive device    Cognitive Function:        06/30/2023    8:30 AM 06/18/2022   12:01 PM 06/12/2021    9:55 AM 08/29/2020    3:33 PM 08/10/2019   10:11 AM  6CIT Screen  What Year? 0 points 0 points 0 points 0 points 0 points  What month? 0 points 0 points 0 points 0 points 0 points  What time? 0 points 0 points 0 points 0  points 0 points  Count back from 20 0 points 0 points 0 points 0 points 0 points  Months in reverse 0 points 0 points 0 points 0 points 0 points  Repeat phrase 0 points 4 points 0 points 0 points 2 points  Total Score 0 points 4 points 0 points 0 points 2 points    Immunizations Immunization History  Administered Date(s) Administered   Fluad Quad(high Dose 65+) 02/13/2020, 02/23/2022   Influenza, High Dose  Seasonal PF 03/10/2018, 01/31/2019   Influenza-Unspecified 02/27/2021, 01/28/2023   PFIZER(Purple Top)SARS-COV-2 Vaccination 06/16/2019, 07/07/2019, 03/06/2020, 09/23/2020, 04/03/2021   Pfizer(Comirnaty)Fall Seasonal Vaccine 12 years and older 03/26/2022   Pneumococcal Conjugate-13 08/04/2018   Pneumococcal Polysaccharide-23 08/16/2020   Tdap 02/23/2022, 02/23/2022   Unspecified SARS-COV-2 Vaccination 02/06/2023   Zoster Recombinant(Shingrix ) 04/07/2022, 06/18/2022    TDAP status: Up to date  Flu Vaccine status: Up to date  Pneumococcal vaccine status: Up to date  Covid-19 vaccine status: Completed vaccines  Qualifies for Shingles Vaccine? Yes   Zostavax completed Yes   Shingrix  Completed?: Yes  Screening Tests Health Maintenance  Topic Date Due   COVID-19 Vaccine (8 - 2024-25 season) 04/03/2023   OPHTHALMOLOGY EXAM  08/03/2023   HEMOGLOBIN A1C  11/03/2023   Diabetic kidney evaluation - Urine ACR  02/02/2024   FOOT EXAM  03/02/2024   Diabetic kidney evaluation - eGFR measurement  05/04/2024   Medicare Annual Wellness (AWV)  06/29/2024   MAMMOGRAM  03/01/2025   Colonoscopy  11/08/2028   DTaP/Tdap/Td (3 - Td or Tdap) 02/24/2032   Pneumonia Vaccine 52+ Years old  Completed   INFLUENZA VACCINE  Completed   DEXA SCAN  Completed   Hepatitis C Screening  Completed   Zoster Vaccines- Shingrix   Completed   HPV VACCINES  Aged Out    Health Maintenance  Health Maintenance Due  Topic Date Due   COVID-19 Vaccine (8 - 2024-25 season) 04/03/2023    Colorectal cancer screening: Type of screening: Colonoscopy. Completed 11/09/2018. Repeat every 10 years  Mammogram status: Completed 03/02/2023. Repeat every year  Bone Density status: Completed 02/09/2018.   Lung Cancer Screening: (Low Dose CT Chest recommended if Age 59-80 years, 20 pack-year currently smoking OR have quit w/in 15years.) does not qualify.   Lung Cancer Screening Referral: no  Additional Screening:  Hepatitis  C Screening: does qualify; Completed 01/15/2012  Vision Screening: Recommended annual ophthalmology exams for early detection of glaucoma and other disorders of the eye. Is the patient up to date with their annual eye exam?  Yes  Who is the provider or what is the name of the office in which the patient attends annual eye exams? [facility] If pt is not established with a provider, would they like to be referred to a provider to establish care? No .   Dental Screening: Recommended annual dental exams for proper oral hygiene  Diabetic Foot Exam: Diabetic Foot Exam: Completed 03/03/2023  Community Resource Referral / Chronic Care Management: CRR required this visit?  No   CCM required this visit?  No     Plan:     I have personally reviewed and noted the following in the patient's chart:   Medical and social history Use of alcohol, tobacco or illicit drugs  Current medications and supplements including opioid prescriptions. Patient is not currently taking opioid prescriptions. Functional ability and status Nutritional status Physical activity Advanced directives List of other physicians Hospitalizations, surgeries, and ER visits in previous 12 months Vitals Screenings to include  cognitive, depression, and falls Referrals and appointments  In addition, I have reviewed and discussed with patient certain preventive protocols, quality metrics, and best practice recommendations. A written personalized care plan for preventive services as well as general preventive health recommendations were provided to patient.     Ardella FORBES Dawn, LPN   11/25/7972   After Visit Summary: (In Person-Printed) AVS printed and given to the patient  Nurse Notes: none

## 2023-06-30 NOTE — Patient Instructions (Signed)
 Brooke Brown , Thank you for taking time to come for your Medicare Wellness Visit. I appreciate your ongoing commitment to your health goals. Please review the following plan we discussed and let me know if I can assist you in the future.   Referrals/Orders/Follow-Ups/Clinician Recommendations: none  This is a list of the screening recommended for you and due dates:  Health Maintenance  Topic Date Due   COVID-19 Vaccine (8 - 2024-25 season) 04/03/2023   Eye exam for diabetics  08/03/2023   Hemoglobin A1C  11/03/2023   Yearly kidney health urinalysis for diabetes  02/02/2024   Complete foot exam   03/02/2024   Yearly kidney function blood test for diabetes  05/04/2024   Medicare Annual Wellness Visit  06/29/2024   Mammogram  03/01/2025   Colon Cancer Screening  11/08/2028   DTaP/Tdap/Td vaccine (3 - Td or Tdap) 02/24/2032   Pneumonia Vaccine  Completed   Flu Shot  Completed   DEXA scan (bone density measurement)  Completed   Hepatitis C Screening  Completed   Zoster (Shingles) Vaccine  Completed   HPV Vaccine  Aged Out    Advanced directives: (Copy Requested) Please bring a copy of your health care power of attorney and living will to the office to be added to your chart at your convenience.  Next Medicare Annual Wellness Visit scheduled for next year: No, office will schedule  insert Preventive Care attachment Insert FALL PREVENTION attachment if needed

## 2023-07-07 ENCOUNTER — Encounter: Payer: Self-pay | Admitting: Internal Medicine

## 2023-07-07 ENCOUNTER — Ambulatory Visit (INDEPENDENT_AMBULATORY_CARE_PROVIDER_SITE_OTHER): Payer: HMO | Admitting: Internal Medicine

## 2023-07-07 VITALS — BP 130/78 | HR 71 | Temp 98.2°F | Ht 65.0 in | Wt 148.2 lb

## 2023-07-07 DIAGNOSIS — R42 Dizziness and giddiness: Secondary | ICD-10-CM | POA: Insufficient documentation

## 2023-07-07 DIAGNOSIS — E1122 Type 2 diabetes mellitus with diabetic chronic kidney disease: Secondary | ICD-10-CM

## 2023-07-07 DIAGNOSIS — N182 Chronic kidney disease, stage 2 (mild): Secondary | ICD-10-CM

## 2023-07-07 MED ORDER — DAPAGLIFLOZIN PROPANEDIOL 10 MG PO TABS: 10.0000 mg | ORAL_TABLET | Freq: Every day | ORAL | 1 refills | Status: DC

## 2023-07-07 NOTE — Patient Instructions (Addendum)
$[id number] -- combined household income  Type 2 Diabetes Mellitus, Diagnosis, Adult Type 2 diabetes (type 2 diabetes mellitus) is a long-term (chronic) disease. It may happen when there is one or both of these problems: The pancreas does not make enough insulin. The body does not react in a normal way to insulin that it makes. Insulin lets sugars go into cells in your body. If you have type 2 diabetes, sugars cannot get into your cells. Sugars build up in the blood. This causes high blood sugar. What are the causes? The exact cause of this condition is not known. What increases the risk? Having type 2 diabetes in your family. Being overweight or very overweight. Not being active. Your body not reacting in a normal way to the insulin it makes. Having higher than normal blood sugar over time. Having a type of diabetes when you were pregnant. Having a condition that causes small fluid-filled sacs on your ovaries. What are the signs or symptoms? At first, you may have no symptoms. You will get symptoms slowly. They may include: More thirst than normal. More hunger than normal. Needing to pee more than normal. Losing weight without trying. Feeling tired. Feeling weak. Seeing things blurry. Dark patches on your skin. How is this treated? This condition may be treated by a diabetes expert. You may need to: Follow an eating plan made by a food expert (dietitian). Get regular exercise. Find ways to deal with stress. Check blood sugar as often as told. Take medicines. Your doctor will set treatment goals for you. Your blood sugar should be at these levels: Before meals: 80-130 mg/dL (2.4-4.0 mmol/L). After meals: below 180 mg/dL (10 mmol/L). Over the last 2-3 months: less than 7%. Follow these instructions at home: Medicines Take your diabetes medicines or insulin every day. Take medicines as told to help you prevent other problems caused by this condition. You may  need: Aspirin. Medicine to lower cholesterol. Medicine to control blood pressure. Questions to ask your doctor Should I meet with a diabetes educator? What medicines do I need, and when should I take them? What will I need to treat my condition at home? When should I check my blood sugar? Where can I find a support group? Who can I call if I have questions? When is my next doctor visit? General instructions Take over-the-counter and prescription medicines only as told by your doctor. Keep all follow-up visits. Where to find more information For help and guidance and more information about diabetes, please go to: American Diabetes Association (ADA): www.diabetes.org American Association of Diabetes Care and Education Specialists (ADCES): www.diabeteseducator.org International Diabetes Federation (IDF): DCOnly.dk Contact a doctor if: Your blood sugar is at or above 240 mg/dL (10.2 mmol/L) for 2 days in a row. You have been sick for 2 days or more, and you are not getting better. You have had a fever for 2 days or more, and you are not getting better. You have any of these problems for more than 6 hours: You cannot eat or drink. You feel like you may vomit. You vomit. You have watery poop (diarrhea). Get help right away if: Your blood sugar is lower than 54 mg/dL (3 mmol/L). You feel mixed up (confused). You have trouble thinking clearly. You have trouble breathing. You have medium or large ketone levels in your pee. These symptoms may be an emergency. Get help right away. Call your local emergency services (911 in the [place] Do not wait to see if the symptoms will  go away. Do not drive yourself to the hospital. Summary Type 2 diabetes is a long-term disease. Your pancreas may not make enough insulin, or your body may not react in a normal way to insulin that it makes. This condition is treated with an eating plan, lifestyle changes, and medicines. Your doctor will set  treatment goals for you. These will help you keep your blood sugar in a healthy range. Keep all follow-up visits. This information is not intended to replace advice given to you by your health care provider. Make sure you discuss any questions you have with your health care provider. Document Revised: 08/05/2020 Document Reviewed: 08/05/2020 Elsevier Patient Education  2024 [employer].

## 2023-07-07 NOTE — Progress Notes (Signed)
 I,Victoria T Deloria Lair, CMA,acting as a Neurosurgeon for Gwynneth Aliment, MD.,have documented all relevant documentation on the behalf of Gwynneth Aliment, MD,as directed by  Gwynneth Aliment, MD while in the presence of Gwynneth Aliment, MD.  Subjective:  Patient ID: Brooke Brown , female    DOB: 05-Feb-1951 , 73 y.o.   MRN: [id number]  Chief Complaint  Patient presents with   Diabetes    HPI  Patient presents today for Farxiga follow up. She reports compliance with medication. Denies headache, chest pain & sob. She has experienced vaginal itching, sx less severe than on Jardiance. She states she feels she drinks enough water daily.         Diabetes She presents for her follow-up diabetic visit. She has type 2 diabetes mellitus. Her disease course has been stable. Hypoglycemia symptoms include dizziness. Pertinent negatives for diabetes include no polydipsia, no polyphagia and no polyuria. There are no hypoglycemic complications. Diabetic complications include nephropathy. Risk factors for coronary artery disease include diabetes mellitus, dyslipidemia, hypertension, post-menopausal and sedentary lifestyle. Her weight is stable. She is following a diabetic diet. She participates in exercise three times a week. Her breakfast blood glucose is taken between 8-9 am. Her breakfast blood glucose range is generally 110-130 mg/dl. Eye exam is current.     Past Medical History:  Diagnosis Date   Anxiety    Arthritis    Diabetes mellitus    Grave's disease    Grave's disease    Hyperlipemia    Hypertension      Family History  Problem Relation Age of Onset   Pancreatic cancer Mother    Cancer Mother    Hypertension Mother    Heart failure Father    Hypertension Father    Diabetes Brother    Breast cancer Cousin      Current Outpatient Medications:    acetaminophen (TYLENOL) 500 MG tablet, Take 500 mg by mouth daily as needed for moderate pain or headache., Disp: , Rfl:     amLODipine (NORVASC) 5 MG tablet, Take 1 tablet by mouth once daily, Disp: 90 tablet, Rfl: 1   aspirin EC 81 MG tablet, Take 81 mg by mouth daily., Disp: , Rfl:    atorvastatin (LIPITOR) 40 MG tablet, TAKE 1 TABLET BY MOUTH ON SUNDAY- SATURDAY., Disp: 90 tablet, Rfl: 2   Blood Glucose Monitoring Suppl (ONE TOUCH ULTRA 2) w/Device KIT, Use to check blood sugar 3 times a day. Dx code e11.65, Disp: 1 kit, Rfl: 2   Carboxymethylcellul-Glycerin (LUBRICATING EYE DROPS OP), Place 1 drop into both eyes 2 (two) times daily., Disp: , Rfl:    dapagliflozin propanediol (FARXIGA) 10 MG TABS tablet, Take 1 tablet (10 mg total) by mouth daily before breakfast., Disp: 90 tablet, Rfl: 1   fluconazole (DIFLUCAN) 100 MG tablet, One tab po today , repeat in 48 hours if needed, Disp: 2 tablet, Rfl: 0   hydrochlorothiazide (HYDRODIURIL) 25 MG tablet, Patient reports taking 1/2 tab in the morning. PER RS., Disp: 45 tablet, Rfl: 2   Magnesium 250 MG TABS, otc (Patient taking differently: Take 250 mg by mouth daily. otc), Disp:  , Rfl: 0   metFORMIN (GLUCOPHAGE-XR) 500 MG 24 hr tablet, Take 1 tablet by mouth twice daily, Disp: 180 tablet, Rfl: 0   olmesartan (BENICAR) 40 MG tablet, Take 1 tablet (40 mg total) by mouth daily., Disp: 90 tablet, Rfl: 2   OneTouch Delica Lancets 30G MISC, USE 1 TO CHECK GLUCOSE ONCE  DAILY, Disp: 100 each, Rfl: 0   ONETOUCH ULTRA test strip, USE 1 STRIP TO CHECK GLUCOSE ONCE DAILY, Disp: 100 each, Rfl: 0   sertraline (ZOLOFT) 50 MG tablet, Take 1 tablet (50 mg total) by mouth daily., Disp: 90 tablet, Rfl: 2   SYNTHROID 112 MCG tablet, TAKE 1 TABLET MONDAY THROUGH SATURDAY AND SKIP SUNDAYS., Disp: 90 tablet, Rfl: 2   No Known Allergies   Review of Systems  Constitutional: Negative.   Respiratory: Negative.    Cardiovascular: Negative.   Gastrointestinal: Negative.   Endocrine: Negative for polydipsia, polyphagia and polyuria.  Neurological:  Positive for dizziness.       Upon conclusion  of visit, she mentions having an episode of lightheadedness while in church on Sunday. She states she normally attends early service, then eats yogurt prior to going to Sunday School.  She did not have meter to check her sugar. The episode occurred shortly AFTER eating the yogurt.  She is not sure what triggered her sx. She has not had any recurrent symptoms.   Psychiatric/Behavioral: Negative.       Today's Vitals   07/07/23 1443  BP: 130/78  Pulse: 71  Temp: 98.2 F (36.8 C)  SpO2: 98%  Weight: 148 lb 3.2 oz (67.2 kg)  Height: 5\' 5"  (1.651 m)   Body mass index is 24.66 kg/m.  Wt Readings from Last 3 Encounters:  07/07/23 148 lb 3.2 oz (67.2 kg)  06/30/23 146 lb (66.2 kg)  03/03/23 146 lb 12.8 oz (66.6 kg)     Objective:  Physical Exam Vitals and nursing note reviewed.  Constitutional:      Appearance: Normal appearance.  HENT:     Head: Normocephalic and atraumatic.  Eyes:     Extraocular Movements: Extraocular movements intact.  Cardiovascular:     Rate and Rhythm: Normal rate and regular rhythm.     Heart sounds: Normal heart sounds.  Pulmonary:     Effort: Pulmonary effort is normal.     Breath sounds: Normal breath sounds.  Musculoskeletal:     Cervical back: Normal range of motion.  Skin:    General: Skin is warm.  Neurological:     General: No focal deficit present.     Mental Status: She is alert.  Psychiatric:        Mood and Affect: Mood normal.        Behavior: Behavior normal.         Assessment And Plan:  Type 2 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 disease, without long-term current use of insulin (HCC) Assessment & Plan: She has tolerated [place] fairly well. I will check BMP today. She was also given Libre sensor and taught how to use CGM to follow her BS throughout the day. It is unlikely this will be covered by her insurance b/c she is not on insulin. She was given two sensors to last for four weeks. I will see her next month - she will  notify me if she has any issues. She does report feeling "bad" when her sugar is less than 100. She is aware that sugars between 90-100 are normal sugars.  We also discussed her diet at length, advised to add 2-3 PB crackers with her snack during church to decrease risk of hypoglycemia. All questions were answered to her satisfaction.   Orders: -     BMP8+eGFR  Lightheadedness Assessment & Plan: I think her sx are possibly due to hypoglycemia. She was given CGM to  closely monitor her sugars over the next two weeks. She is reminded to stay well hydrated, switch to [place] yogurt and to add nuts as a snack. She will let me know if she has recurrent sx.    Other orders -     Dapagliflozin Propanediol; Take 1 tablet (10 mg total) by mouth daily before breakfast.  Dispense: 90 tablet; Refill: 1   TIME SPENT: More than 30 minutes was spent during her visit including sensor application and instruction on how to use the app.   Return if symptoms worsen or fail to improve.  Patient was given opportunity to ask questions. Patient verbalized understanding of the plan and was able to repeat key elements of the plan. All questions were answered to their satisfaction.    I, Gwynneth Aliment, MD, have reviewed all documentation for this visit. The documentation on 07/07/23 for the exam, diagnosis, procedures, and orders are all accurate and complete.   IF YOU HAVE BEEN REFERRED TO A SPECIALIST, IT MAY TAKE 1-2 WEEKS TO SCHEDULE/PROCESS THE REFERRAL. IF YOU HAVE NOT HEARD FROM US/SPECIALIST IN TWO WEEKS, PLEASE GIVE [place] A CALL AT [phone].   THE PATIENT IS ENCOURAGED TO PRACTICE SOCIAL DISTANCING DUE TO THE COVID-19 PANDEMIC.

## 2023-07-07 NOTE — Assessment & Plan Note (Addendum)
I think her sx are possibly due to hypoglycemia. She was given CGM to closely monitor her sugars over the next two weeks. She is reminded to stay well hydrated, switch to [place] yogurt and to add nuts as a snack. She will let me know if she has recurrent sx.

## 2023-07-07 NOTE — Assessment & Plan Note (Addendum)
She has tolerated [place] fairly well. I will check BMP today. She was also given Libre sensor and taught how to use CGM to follow her BS throughout the day. It is unlikely this will be covered by her insurance b/c she is not on insulin. She was given two sensors to last for four weeks. I will see her next month - she will notify me if she has any issues. She does report feeling "bad" when her sugar is less than 100. She is aware that sugars between 90-100 are normal sugars.  We also discussed her diet at length, advised to add 2-3 PB crackers with her snack during church to decrease risk of hypoglycemia. All questions were answered to her satisfaction.

## 2023-07-08 ENCOUNTER — Encounter: Payer: Self-pay | Admitting: Internal Medicine

## 2023-07-08 LAB — BMP8+EGFR
BUN/Creatinine Ratio: 15 (ref 12–28)
BUN: 14 mg/dL (ref 8–27)
CO2: 26 mmol/L (ref 20–29)
Calcium: 9.7 mg/dL (ref 8.7–10.3)
Chloride: 103 mmol/L (ref 96–106)
Creatinine, Ser: 0.91 mg/dL (ref 0.57–1.00)
Glucose: 97 mg/dL (ref 70–99)
Potassium: 4.3 mmol/L (ref 3.5–5.2)
Sodium: 141 mmol/L (ref 134–144)
eGFR: 66 mL/min/{1.73_m2} (ref >59)

## 2023-07-10 ENCOUNTER — Other Ambulatory Visit: Payer: Self-pay | Admitting: Internal Medicine

## 2023-07-10 DIAGNOSIS — E890 Postprocedural hypothyroidism: Secondary | ICD-10-CM

## 2023-07-10 DIAGNOSIS — I700 Atherosclerosis of aorta: Secondary | ICD-10-CM

## 2023-07-10 DIAGNOSIS — I1310 Hypertensive heart and chronic kidney disease without heart failure, with stage 1 through stage 4 chronic kidney disease, or unspecified chronic kidney disease: Secondary | ICD-10-CM

## 2023-07-10 DIAGNOSIS — E7800 Pure hypercholesterolemia, unspecified: Secondary | ICD-10-CM

## 2023-07-10 DIAGNOSIS — E1122 Type 2 diabetes mellitus with diabetic chronic kidney disease: Secondary | ICD-10-CM

## 2023-07-15 ENCOUNTER — Telehealth: Payer: Self-pay | Admitting: Pharmacist

## 2023-07-15 DIAGNOSIS — N182 Chronic kidney disease, stage 2 (mild): Secondary | ICD-10-CM

## 2023-07-15 NOTE — Progress Notes (Unsigned)
 07/15/2023 Name: Brooke Brown MRN: [id number] DOB: 1950/06/26  Chief Complaint  Patient presents with   Medication Assistance    Farxiga    Brooke Brown is a 73 y.o. year old female who presented for a telephone visit.   They were referred to the pharmacist by their PCP for assistance in managing medication access.    Subjective:  Care Team: Primary Care Provider: Dorothyann Peng, MD ; Next Scheduled Visit: 08/11/2023  Medication Access/Adherence  Current Pharmacy:  [facility] [address], [place] - [address] [place] [address] Phone: [phone] Fax: [phone]  [facility] - [place], [place] - [address] [place] [address] Phone: [phone] Fax: [phone]   Patient reports affordability concerns with their medications: Yes  Patient reports access/transportation concerns to their pharmacy: No  Patient reports adherence concerns with their medications:  No     Diabetes:  Current medications:  Metformin XR 500 mg 1 tablet twice daily Farxiga 10 mg 1 tablet before breakfast   Current glucose readings: 92 mg/dl this morning  Patient denies hypoglycemic or hyperglycemic symptoms.  HgA1c- 6.6    Objective:  Lab Results  Component Value Date   HGBA1C 6.6 (H) 05/05/2023    Lab Results  Component Value Date   CREATININE 0.91 07/07/2023   BUN 14 07/07/2023   NA 141 07/07/2023   K 4.3 07/07/2023   CL 103 07/07/2023   CO2 26 07/07/2023    Lab Results  Component Value Date   CHOL 160 05/05/2023   HDL 53 05/05/2023   LDLCALC 89 05/05/2023   TRIG 100 05/05/2023   CHOLHDL 3.0 05/05/2023    Medications Reviewed Today     Reviewed by Beecher Mcardle, RPH (Pharmacist) on 07/15/23 at 1218  Med List Status: <None>   Medication Order Taking? Sig Documenting Provider Last Dose Status Informant  acetaminophen (TYLENOL) 500 MG tablet  [id number] Yes Take 500 mg by mouth daily as needed for moderate pain or headache. [provider] Taking Active Self  amLODipine (NORVASC) 5 MG tablet [id number] Yes Take 1 tablet by mouth once daily Dorothyann Peng, MD Taking Active   aspirin EC 81 MG tablet [id number] Yes Take 81 mg by mouth daily. [provider] Taking Active Self  atorvastatin (LIPITOR) 40 MG tablet [id number] Yes TAKE 1 TABLET BY MOUTH ON SUNDAY- SATURDAY. Dorothyann Peng, MD Taking Active   Blood Glucose Monitoring Suppl (ONE TOUCH ULTRA 2) w/Device KIT [id number] Yes Use to check blood sugar 3 times a day. Dx code e11.65 Dorothyann Peng, MD Taking Active   Carboxymethylcellul-Glycerin [facility] EYE DROPS OP) [id number] Yes Place 1 drop into both eyes 2 (two) times daily. [provider] Taking Active Self  Cholecalciferol 125 MCG (5000 UT) capsule [id number] Yes Take 5,000 Units by mouth daily. [provider]  Active   dapagliflozin propanediol (FARXIGA) 10 MG TABS tablet [id number] Yes Take 1 tablet (10 mg total) by mouth daily before breakfast. Dorothyann Peng, MD Taking Active   fluconazole (DIFLUCAN) 100 MG tablet [id number] No One tab po today , repeat in 48 hours if needed  Patient not taking: Reported on 07/15/2023   Dorothyann Peng, MD Not Taking Active   hydrochlorothiazide (HYDRODIURIL) 25 MG tablet [id number] Yes Patient reports taking 1/2 tab in the morning. PER RS. Dorothyann Peng, MD Taking Active   Magnesium 250 MG TABS [id number] Yes otc  Patient taking differently: Take 250 mg by  mouth daily. Tor Netters, MD Taking Active   metFORMIN (GLUCOPHAGE-XR) 500 MG 24 hr tablet [id number] Yes Take 1 tablet by mouth twice daily Dorothyann Peng, MD Taking Active   olmesartan [facility]) 40 MG tablet [id number] Yes Take 1 tablet (40 mg total) by mouth daily. Dorothyann Peng, MD Taking Active   OneTouch Delica Lancets 30G [place] [id number] Yes USE 1 TO CHECK GLUCOSE ONCE DAILY Dorothyann Peng, MD  Taking Active   [facility] ULTRA test strip [id number] Yes USE 1 STRIP TO CHECK GLUCOSE ONCE DAILY Dorothyann Peng, MD Taking Active   sertraline (ZOLOFT) 50 MG tablet [id number] Yes Take 1 tablet (50 mg total) by mouth daily. Dorothyann Peng, MD Taking Active   SYNTHROID 112 MCG tablet [id number] Yes TAKE 1 TABLET MONDAY THROUGH SATURDAY AND SKIP SUNDAYS. Dorothyann Peng, MD Taking Active               Assessment/Plan:   Diabetes: - Currently controlled - Recommend to Continue current therapy  - Does NOT Meet financial criteria for [employer]  patient assistance program (she is over income)  Follow Up Plan:    Route note to Provider.  Beecher Mcardle, PharmD, BCACP Clinical Pharmacist [phone]

## 2023-07-20 ENCOUNTER — Ambulatory Visit: Payer: HMO

## 2023-08-09 ENCOUNTER — Encounter: Payer: Self-pay | Admitting: Internal Medicine

## 2023-08-09 DIAGNOSIS — E119 Type 2 diabetes mellitus without complications: Secondary | ICD-10-CM | POA: Diagnosis not present

## 2023-08-09 DIAGNOSIS — H26491 Other secondary cataract, right eye: Secondary | ICD-10-CM | POA: Diagnosis not present

## 2023-08-09 LAB — HM DIABETES EYE EXAM

## 2023-08-11 ENCOUNTER — Ambulatory Visit (INDEPENDENT_AMBULATORY_CARE_PROVIDER_SITE_OTHER): Payer: Self-pay | Admitting: Internal Medicine

## 2023-08-11 ENCOUNTER — Encounter: Payer: Self-pay | Admitting: Internal Medicine

## 2023-08-11 VITALS — BP 124/78 | HR 68 | Temp 97.9°F | Ht 65.0 in | Wt 148.0 lb

## 2023-08-11 DIAGNOSIS — I1310 Hypertensive heart and chronic kidney disease without heart failure, with stage 1 through stage 4 chronic kidney disease, or unspecified chronic kidney disease: Secondary | ICD-10-CM | POA: Diagnosis not present

## 2023-08-11 DIAGNOSIS — I700 Atherosclerosis of aorta: Secondary | ICD-10-CM | POA: Diagnosis not present

## 2023-08-11 DIAGNOSIS — E890 Postprocedural hypothyroidism: Secondary | ICD-10-CM | POA: Diagnosis not present

## 2023-08-11 DIAGNOSIS — N182 Chronic kidney disease, stage 2 (mild): Secondary | ICD-10-CM

## 2023-08-11 DIAGNOSIS — E1122 Type 2 diabetes mellitus with diabetic chronic kidney disease: Secondary | ICD-10-CM

## 2023-08-11 NOTE — Assessment & Plan Note (Signed)
Chronic, LDL goal is less than 70.  She will continue with atorvastatin 40mg  daily. She is encouraged to follow heart healthy lifestyle.

## 2023-08-11 NOTE — Assessment & Plan Note (Signed)
 Chronic, well controlled.  She will continue with amlodipine 5mg  daily, hydrochlorothiazide 25mg  daily and olmesartan 40mg  daily. She is encouraged to follow low sodium diet. She will f/u in four months.

## 2023-08-11 NOTE — Patient Instructions (Addendum)
 [place] - over the counter continuous glucose monitor  Type 2 Diabetes Mellitus, Diagnosis, Adult Type 2 diabetes (type 2 diabetes mellitus) is a long-term (chronic) disease. It may happen when there is one or both of these problems: The pancreas does not make enough insulin. The body does not react in a normal way to insulin that it makes. Insulin lets sugars go into cells in your body. If you have type 2 diabetes, sugars cannot get into your cells. Sugars build up in the blood. This causes high blood sugar. What are the causes? The exact cause of this condition is not known. What increases the risk? Having type 2 diabetes in your family. Being overweight or very overweight. Not being active. Your body not reacting in a normal way to the insulin it makes. Having higher than normal blood sugar over time. Having a type of diabetes when you were pregnant. Having a condition that causes small fluid-filled sacs on your ovaries. What are the signs or symptoms? At first, you may have no symptoms. You will get symptoms slowly. They may include: More thirst than normal. More hunger than normal. Needing to pee more than normal. Losing weight without trying. Feeling tired. Feeling weak. Seeing things blurry. Dark patches on your skin. How is this treated? This condition may be treated by a diabetes expert. You may need to: Follow an eating plan made by a food expert (dietitian). Get regular exercise. Find ways to deal with stress. Check blood sugar as often as told. Take medicines. Your doctor will set treatment goals for you. Your blood sugar should be at these levels: Before meals: 80-130 mg/dL (3.8-7.5 mmol/L). After meals: below 180 mg/dL (10 mmol/L). Over the last 2-3 months: less than 7%. Follow these instructions at home: Medicines Take your diabetes medicines or insulin every day. Take medicines as told to help you prevent other problems caused by this condition. You may  need: Aspirin. Medicine to lower cholesterol. Medicine to control blood pressure. Questions to ask your doctor Should I meet with a diabetes educator? What medicines do I need, and when should I take them? What will I need to treat my condition at home? When should I check my blood sugar? Where can I find a support group? Who can I call if I have questions? When is my next doctor visit? General instructions Take over-the-counter and prescription medicines only as told by your doctor. Keep all follow-up visits. Where to find more information For help and guidance and more information about diabetes, please go to: American Diabetes Association (ADA): www.diabetes.org American Association of Diabetes Care and Education Specialists (ADCES): www.diabeteseducator.org International Diabetes Federation (IDF): DCOnly.dk Contact a doctor if: Your blood sugar is at or above 240 mg/dL (64.3 mmol/L) for 2 days in a row. You have been sick for 2 days or more, and you are not getting better. You have had a fever for 2 days or more, and you are not getting better. You have any of these problems for more than 6 hours: You cannot eat or drink. You feel like you may vomit. You vomit. You have watery poop (diarrhea). Get help right away if: Your blood sugar is lower than 54 mg/dL (3 mmol/L). You feel mixed up (confused). You have trouble thinking clearly. You have trouble breathing. You have medium or large ketone levels in your pee. These symptoms may be an emergency. Get help right away. Call your local emergency services (911 in the [place] Do not wait to see if  the symptoms will go away. Do not drive yourself to the hospital. Summary Type 2 diabetes is a long-term disease. Your pancreas may not make enough insulin, or your body may not react in a normal way to insulin that it makes. This condition is treated with an eating plan, lifestyle changes, and medicines. Your doctor will set  treatment goals for you. These will help you keep your blood sugar in a healthy range. Keep all follow-up visits. This information is not intended to replace advice given to you by your health care provider. Make sure you discuss any questions you have with your health care provider. Document Revised: 08/05/2020 Document Reviewed: 08/05/2020 Elsevier Patient Education  2024 [employer].

## 2023-08-11 NOTE — Assessment & Plan Note (Signed)
 Chronic, sx are stable.  She likes her Libre sensor; however, does not have a rx. She is not on insulin.  She was given two sensors to last for four weeks.   We also discussed her diet at length, she is encouraged to continue to eat 2-3 PB crackers with her snack during church to decrease risk of hypoglycemia. All questions were answered to her satisfaction.

## 2023-08-11 NOTE — Assessment & Plan Note (Signed)
Chronic, currently taking Synthroid daily. I will check thyroid panel and adjust meds as needed.

## 2023-08-11 NOTE — Progress Notes (Signed)
 I,Victoria T Deloria Lair, CMA,acting as a Neurosurgeon for Gwynneth Aliment, MD.,have documented all relevant documentation on the behalf of Gwynneth Aliment, MD,as directed by  Gwynneth Aliment, MD while in the presence of Gwynneth Aliment, MD.  Subjective:  Patient ID: Brooke Brown , female    DOB: Sep 20, 1950 , 73 y.o.   MRN: [id number]  Chief Complaint  Patient presents with  . Diabetes  . Hypertension    HPI  Patient presents today for diabetes follow up. She reports compliance with Farxiga 10MG  medication. Denies headache, chest pain & sob.  She has been using Libre sensors that were given to her at her last visit. She has seen the benefit of using the CGM. She often awakened in the middle of the night with sugars below 70 which caused the alarm to go off. When her sugars are below 70, she feels faint and shaky.  She would wake up and eat PB crackers and drink orange juice.which helped her to recover.   She adds that she had diabetic eye exam yesterday.    Diabetes She presents for her follow-up diabetic visit. She has type 2 diabetes mellitus. Her disease course has been stable. There are no hypoglycemic associated symptoms. Pertinent negatives for diabetes include no blurred vision, no polydipsia, no polyphagia and no polyuria. There are no hypoglycemic complications. Diabetic complications include nephropathy. Risk factors for coronary artery disease include diabetes mellitus, dyslipidemia, hypertension, post-menopausal and sedentary lifestyle. Her weight is stable. She is following a diabetic diet. She participates in exercise three times a week. Her breakfast blood glucose is taken between 8-9 am. Her breakfast blood glucose range is generally 110-130 mg/dl. Eye exam is current.  Hypertension This is a chronic problem. The current episode started more than 1 year ago. The problem has been gradually improving since onset. The problem is controlled. Pertinent negatives include no blurred  vision, neck pain or palpitations.     Past Medical History:  Diagnosis Date  . Anxiety   . Arthritis   . Diabetes mellitus   . Grave's disease   . Grave's disease   . Hyperlipemia   . Hypertension      Family History  Problem Relation Age of Onset  . Pancreatic cancer Mother   . Cancer Mother   . Hypertension Mother   . Heart failure Father   . Hypertension Father   . Diabetes Brother   . Breast cancer Cousin      Current Outpatient Medications:  .  acetaminophen (TYLENOL) 500 MG tablet, Take 500 mg by mouth daily as needed for moderate pain or headache., Disp: , Rfl:  .  amLODipine (NORVASC) 5 MG tablet, Take 1 tablet by mouth once daily, Disp: 90 tablet, Rfl: 1 .  aspirin EC 81 MG tablet, Take 81 mg by mouth daily., Disp: , Rfl:  .  atorvastatin (LIPITOR) 40 MG tablet, TAKE 1 TABLET BY MOUTH ON SUNDAY- SATURDAY., Disp: 90 tablet, Rfl: 2 .  Blood Glucose Monitoring Suppl (ONE TOUCH ULTRA 2) w/Device KIT, Use to check blood sugar 3 times a day. Dx code e11.65, Disp: 1 kit, Rfl: 2 .  Carboxymethylcellul-Glycerin (LUBRICATING EYE DROPS OP), Place 1 drop into both eyes 2 (two) times daily., Disp: , Rfl:  .  Cholecalciferol 125 MCG (5000 UT) capsule, Take 5,000 Units by mouth daily., Disp: , Rfl:  .  dapagliflozin propanediol (FARXIGA) 10 MG TABS tablet, Take 1 tablet (10 mg total) by mouth daily before breakfast., Disp: 90  tablet, Rfl: 1 .  hydrochlorothiazide (HYDRODIURIL) 25 MG tablet, Patient reports taking 1/2 tab in the morning. PER RS., Disp: 45 tablet, Rfl: 2 .  Magnesium 250 MG TABS, otc (Patient taking differently: Take 250 mg by mouth daily. otc), Disp:  , Rfl: 0 .  metFORMIN (GLUCOPHAGE-XR) 500 MG 24 hr tablet, Take 1 tablet by mouth twice daily, Disp: 180 tablet, Rfl: 0 .  olmesartan (BENICAR) 40 MG tablet, Take 1 tablet (40 mg total) by mouth daily., Disp: 90 tablet, Rfl: 2 .  ONETOUCH ULTRA test strip, USE 1 STRIP TO CHECK GLUCOSE ONCE DAILY, Disp: 100 each, Rfl:  0 .  sertraline (ZOLOFT) 50 MG tablet, Take 1 tablet (50 mg total) by mouth daily., Disp: 90 tablet, Rfl: 2 .  SYNTHROID 112 MCG tablet, TAKE 1 TABLET MONDAY THROUGH SATURDAY AND SKIP SUNDAYS., Disp: 90 tablet, Rfl: 2 .  fluconazole (DIFLUCAN) 100 MG tablet, One tab po today , repeat in 48 hours if needed (Patient not taking: Reported on 08/11/2023), Disp: 2 tablet, Rfl: 0 .  OneTouch Delica Lancets 30G MISC, USE 1 TO CHECK GLUCOSE ONCE DAILY, Disp: 100 each, Rfl: 0   No Known Allergies   Review of Systems  Constitutional: Negative.   Eyes:  Negative for blurred vision.  Respiratory: Negative.    Cardiovascular: Negative.  Negative for palpitations.  Gastrointestinal: Negative.   Endocrine: Negative for polydipsia, polyphagia and polyuria.  Musculoskeletal:  Negative for neck pain.  Psychiatric/Behavioral: Negative.       Today's Vitals   08/11/23 0936  BP: 124/78  Pulse: 68  Temp: 97.9 F (36.6 C)  SpO2: 98%  Weight: 148 lb (67.1 kg)  Height: 5\' 5"  (1.651 m)   Body mass index is 24.63 kg/m.  Wt Readings from Last 3 Encounters:  08/11/23 148 lb (67.1 kg)  07/07/23 148 lb 3.2 oz (67.2 kg)  06/30/23 146 lb (66.2 kg)     Objective:  Physical Exam Vitals and nursing note reviewed.  Constitutional:      Appearance: Normal appearance.  HENT:     Head: Normocephalic and atraumatic.  Eyes:     Extraocular Movements: Extraocular movements intact.  Cardiovascular:     Rate and Rhythm: Normal rate and regular rhythm.     Heart sounds: Normal heart sounds.  Pulmonary:     Effort: Pulmonary effort is normal.     Breath sounds: Normal breath sounds.  Musculoskeletal:     Cervical back: Normal range of motion.  Skin:    General: Skin is warm.  Neurological:     General: No focal deficit present.     Mental Status: She is alert.  Psychiatric:        Mood and Affect: Mood normal.        Behavior: Behavior normal.        Assessment And Plan:  Type 2 diabetes mellitus  with stage 2 chronic kidney disease, without long-term current use of insulin (HCC) Assessment & Plan: Chronic, sx are stable.  She likes her Libre sensor; however, does not have a rx. She is not on insulin.  She was given two sensors to last for four weeks.   We also discussed her diet at length, she is encouraged to continue to eat 2-3 PB crackers with her snack during church to decrease risk of hypoglycemia. All questions were answered to her satisfaction.   Orders: -     Hemoglobin A1c  Hypertensive heart and renal disease with renal failure, stage 1  through stage 4 or unspecified chronic kidney disease, without heart failure Assessment & Plan: Chronic, well controlled.  She will continue with amlodipine 5mg  daily, hydrochlorothiazide 25mg  daily and olmesartan 40mg  daily. She is encouraged to follow low sodium diet. She will f/u in four months.   Orders: -     CBC  Atherosclerosis of aorta (HCC) Assessment & Plan: Chronic, LDL goal is less than 70.  She will continue with atorvastatin 40mg  daily. She is encouraged to follow heart healthy lifestyle.   Orders: -     CBC  Postablative hypothyroidism Assessment & Plan: Chronic, currently taking Synthroid daily. I will check thyroid panel and adjust meds as needed.   Orders: -     CBC -     TSH + free T4     Return in 4 months (on 12/11/2023) for dm check.  Patient was given opportunity to ask questions. Patient verbalized understanding of the plan and was able to repeat key elements of the plan. All questions were answered to their satisfaction.    I, Gwynneth Aliment, MD, have reviewed all documentation for this visit. The documentation on 08/11/23 for the exam, diagnosis, procedures, and orders are all accurate and complete.   IF YOU HAVE BEEN REFERRED TO A SPECIALIST, IT MAY TAKE 1-2 WEEKS TO SCHEDULE/PROCESS THE REFERRAL. IF YOU HAVE NOT HEARD FROM US/SPECIALIST IN TWO WEEKS, PLEASE GIVE [place] A CALL AT [phone].    THE PATIENT IS ENCOURAGED TO PRACTICE SOCIAL DISTANCING DUE TO THE COVID-19 PANDEMIC.

## 2023-08-12 LAB — HEMOGLOBIN A1C
Est. average glucose Bld gHb Est-mCnc: 140 mg/dL
Hgb A1c MFr Bld: 6.5 % — ABNORMAL HIGH (ref 4.8–5.6)

## 2023-08-12 LAB — CBC
Hematocrit: 43.1 % (ref 34.0–46.6)
Hemoglobin: 14.3 g/dL (ref 11.1–15.9)
MCH: 29.4 pg (ref 26.6–33.0)
MCHC: 33.2 g/dL (ref 31.5–35.7)
MCV: 89 fL (ref 79–97)
Platelets: 297 10*3/uL (ref 150–450)
RBC: 4.87 x10E6/uL (ref 3.77–5.28)
RDW: 12.9 % (ref 11.7–15.4)
WBC: 4.6 10*3/uL (ref 3.4–10.8)

## 2023-08-12 LAB — TSH+FREE T4
Free T4: 1.34 ng/dL (ref 0.82–1.77)
TSH: 2.86 u[IU]/mL (ref 0.450–4.500)

## 2023-08-23 DIAGNOSIS — E119 Type 2 diabetes mellitus without complications: Secondary | ICD-10-CM | POA: Diagnosis not present

## 2023-08-23 DIAGNOSIS — Z961 Presence of intraocular lens: Secondary | ICD-10-CM | POA: Diagnosis not present

## 2023-09-15 ENCOUNTER — Ambulatory Visit: Payer: PPO | Admitting: Sports Medicine

## 2023-10-06 ENCOUNTER — Ambulatory Visit
Admission: RE | Admit: 2023-10-06 | Discharge: 2023-10-06 | Disposition: A | Discharge status: Home / Self Care | Payer: PPO | Source: Ambulatory Visit | Attending: Internal Medicine | Admitting: Internal Medicine

## 2023-10-06 DIAGNOSIS — E2839 Other primary ovarian failure: Secondary | ICD-10-CM

## 2023-10-06 DIAGNOSIS — N958 Other specified menopausal and perimenopausal disorders: Secondary | ICD-10-CM | POA: Diagnosis not present

## 2023-10-08 ENCOUNTER — Ambulatory Visit: Payer: Self-pay | Admitting: Internal Medicine

## 2023-11-30 ENCOUNTER — Other Ambulatory Visit: Payer: Self-pay | Admitting: Internal Medicine

## 2023-12-04 ENCOUNTER — Other Ambulatory Visit: Payer: Self-pay | Admitting: Internal Medicine

## 2023-12-06 ENCOUNTER — Other Ambulatory Visit: Payer: Self-pay | Admitting: Internal Medicine

## 2023-12-13 ENCOUNTER — Ambulatory Visit: Admitting: Internal Medicine

## 2023-12-13 ENCOUNTER — Other Ambulatory Visit: Payer: Self-pay | Admitting: Internal Medicine

## 2023-12-13 ENCOUNTER — Encounter: Payer: Self-pay | Admitting: Internal Medicine

## 2023-12-13 ENCOUNTER — Telehealth: Payer: Self-pay | Admitting: Pharmacist

## 2023-12-13 VITALS — BP 132/80 | HR 69 | Temp 98.4°F | Ht 65.0 in | Wt 150.6 lb

## 2023-12-13 DIAGNOSIS — E1122 Type 2 diabetes mellitus with diabetic chronic kidney disease: Secondary | ICD-10-CM | POA: Diagnosis not present

## 2023-12-13 DIAGNOSIS — I700 Atherosclerosis of aorta: Secondary | ICD-10-CM | POA: Diagnosis not present

## 2023-12-13 DIAGNOSIS — I1310 Hypertensive heart and chronic kidney disease without heart failure, with stage 1 through stage 4 chronic kidney disease, or unspecified chronic kidney disease: Secondary | ICD-10-CM

## 2023-12-13 DIAGNOSIS — E7800 Pure hypercholesterolemia, unspecified: Secondary | ICD-10-CM | POA: Diagnosis not present

## 2023-12-13 DIAGNOSIS — N182 Chronic kidney disease, stage 2 (mild): Secondary | ICD-10-CM | POA: Diagnosis not present

## 2023-12-13 MED ORDER — METFORMIN HCL ER 500 MG PO TB24: 500.0000 mg | ORAL_TABLET | Freq: Two times a day (BID) | ORAL | 2 refills | Status: AC

## 2023-12-13 MED ORDER — AMLODIPINE BESYLATE 5 MG PO TABS: 5.0000 mg | ORAL_TABLET | Freq: Every day | ORAL | 2 refills | Status: AC

## 2023-12-13 NOTE — Assessment & Plan Note (Signed)
 Chronic, well controlled.  She will continue with amlodipine  5mg  daily, hydrochlorothiazide  25mg  daily and olmesartan  40mg  daily. She is encouraged to follow low sodium diet. She will f/u in four months.

## 2023-12-13 NOTE — Progress Notes (Signed)
 I,Victoria T Emmitt, CMA,acting as a neurosurgeon for Brooke LOISE Slocumb, MD.,have documented all relevant documentation on the behalf of Brooke LOISE Slocumb, MD,as directed by  Brooke LOISE Slocumb, MD while in the presence of Brooke LOISE Slocumb, MD.  Subjective:  Patient ID: Brooke Brown , female    DOB: 1951/05/04 , 73 y.o.   MRN: [id number]  Chief Complaint  Patient presents with   Diabetes    Patient presents today for dm, bp, thyroid  & cholesterol follow up. She reports compliance with medications. Denies headache, chest pain & sob. While here she states noticing loose stool, not runny. She has bowel movements 3 times a week but not daily. She wants to know what could cause this.   Hypertension   Hyperlipidemia    HPI Discussed the use of AI scribe software for clinical note transcription with the patient, who gave verbal consent to proceed.  History of Present Illness Brooke Brown is a 73 year old female with diabetes and hypertension who presents for management of her chronic conditions.  Her blood sugar levels have been stable, with a recent reading of 113 mg/dL. She experiences occasional hypoglycemic episodes if she does not eat regularly, which she manages by keeping orange juice on hand. She has been trying to eat more consistently after gaining a couple of pounds. She uses a Dexcom device to monitor her glucose levels and has noticed it alerts her when her blood sugar is low.  Her blood pressure typically runs around 120/80 mmHg, but it can increase when she is not feeling well or has not been exercising. She tries to exercise at least three days a week for 45 minutes to an hour. She enjoys salty snacks like veggie sticks, which she sometimes consumes in front of the TV.  She is currently taking amlodipine  5 mg, aspirin, atorvastatin  40 mg, Farxiga  10 mg, hydrochlorothiazide  half a tablet daily, metformin  500 mg, olmesartan  40 mg, sertraline  50 mg, and Synthroid , which she  takes Monday through Saturday, skipping Sunday. She has a month's supply of Synthroid .  There is no known family history of heart disease or strokes. She is unaware of her father's side of the family's medical history. She recalls a previous episode of passing out, possibly related to low blood sugar, which led to a cardiology evaluation where an echocardiogram showed her heart was pumping well, but there was calcification of the aortic valve.   Diabetes She presents for her follow-up diabetic visit. She has type 2 diabetes mellitus. Her disease course has been stable. There are no hypoglycemic associated symptoms. Pertinent negatives for diabetes include no blurred vision, no polydipsia, no polyphagia and no polyuria. There are no hypoglycemic complications. Diabetic complications include nephropathy. Risk factors for coronary artery disease include diabetes mellitus, dyslipidemia, hypertension, post-menopausal and sedentary lifestyle. Her weight is stable. She is following a diabetic diet. She participates in exercise three times a week. Her breakfast blood glucose is taken between 8-9 am. Her breakfast blood glucose range is generally 110-130 mg/dl. Eye exam is current.  Hypertension This is a chronic problem. The current episode started more than 1 year ago. The problem has been gradually improving since onset. The problem is controlled. Pertinent negatives include no blurred vision, neck pain or palpitations.     Past Medical History:  Diagnosis Date   Anxiety    Arthritis    Diabetes mellitus    Grave's disease    Grave's disease    Hyperlipemia  Hypertension      Family History  Problem Relation Age of Onset   Pancreatic cancer Mother    Cancer Mother    Hypertension Mother    Heart failure Father    Hypertension Father    Diabetes Brother    Breast cancer Cousin      Current Outpatient Medications:    acetaminophen  (TYLENOL ) 500 MG tablet, Take 500 mg by mouth daily as  needed for moderate pain or headache., Disp: , Rfl:    aspirin EC 81 MG tablet, Take 81 mg by mouth daily., Disp: , Rfl:    atorvastatin  (LIPITOR) 40 MG tablet, TAKE 1 TABLET BY MOUTH ON SUNDAY- SATURDAY., Disp: 90 tablet, Rfl: 2   Blood Glucose Monitoring Suppl (ONE TOUCH ULTRA 2) w/Device KIT, Use to check blood sugar 3 times a day. Dx code e11.65, Disp: 1 kit, Rfl: 2   Carboxymethylcellul-Glycerin (LUBRICATING EYE DROPS OP), Place 1 drop into both eyes 2 (two) times daily., Disp: , Rfl:    dapagliflozin  propanediol (FARXIGA ) 10 MG TABS tablet, Take 1 tablet (10 mg total) by mouth daily before breakfast., Disp: 90 tablet, Rfl: 1   hydrochlorothiazide  (HYDRODIURIL ) 25 MG tablet, Patient reports taking 1/2 tab in the morning. PER RS., Disp: 45 tablet, Rfl: 2   Magnesium  250 MG TABS, otc (Patient taking differently: Take 250 mg by mouth daily. otc), Disp:  , Rfl: 0   olmesartan  (BENICAR ) 40 MG tablet, Take 1 tablet by mouth once daily, Disp: 90 tablet, Rfl: 1   OneTouch Delica Lancets 30G MISC, USE 1 TO CHECK GLUCOSE ONCE DAILY, Disp: 100 each, Rfl: 0   ONETOUCH ULTRA test strip, USE 1 STRIP TO CHECK GLUCOSE ONCE DAILY, Disp: 100 each, Rfl: 0   sertraline  (ZOLOFT ) 50 MG tablet, Take 1 tablet (50 mg total) by mouth daily., Disp: 90 tablet, Rfl: 2   amLODipine  (NORVASC ) 5 MG tablet, Take 1 tablet (5 mg total) by mouth daily., Disp: 90 tablet, Rfl: 2   metFORMIN  (GLUCOPHAGE -XR) 500 MG 24 hr tablet, Take 1 tablet (500 mg total) by mouth 2 (two) times daily., Disp: 180 tablet, Rfl: 2   SYNTHROID  112 MCG tablet, TAKE 1 TABLET MONDAY THROUGH SATURDAY AND SKIP SUNDAYS., Disp: 90 tablet, Rfl: 2   No Known Allergies   Review of Systems  Constitutional: Negative.   Eyes:  Negative for blurred vision.  Respiratory: Negative.    Cardiovascular: Negative.  Negative for palpitations.  Endocrine: Negative for polydipsia, polyphagia and polyuria.  Musculoskeletal:  Negative for neck pain.  Neurological:  Negative.   Psychiatric/Behavioral: Negative.       Today's Vitals   12/13/23 1143  BP: 132/80  Pulse: 69  Temp: 98.4 F (36.9 C)  SpO2: 98%  Weight: 150 lb 9.6 oz (68.3 kg)  Height: 5' 5 (1.651 m)   Body mass index is 25.06 kg/m.  Wt Readings from Last 3 Encounters:  12/13/23 150 lb 9.6 oz (68.3 kg)  08/11/23 148 lb (67.1 kg)  07/07/23 148 lb 3.2 oz (67.2 kg)    BP Readings from Last 3 Encounters:  12/13/23 132/80  08/11/23 124/78  07/07/23 130/78   The 10-year ASCVD risk score (Arnett DK, et al., 2019) is: 27.3%   Values used to calculate the score:     Age: 14 years     Clincally relevant sex: Female     Is Non-Hispanic African American: Yes     Diabetic: Yes     Tobacco smoker: No  Systolic Blood Pressure: 132 mmHg     Is BP treated: Yes     HDL Cholesterol: 53 mg/dL     Total Cholesterol: 160 mg/dL  Objective:  Physical Exam Vitals and nursing note reviewed.  Constitutional:      Appearance: Normal appearance.  HENT:     Head: Normocephalic and atraumatic.  Eyes:     Extraocular Movements: Extraocular movements intact.  Cardiovascular:     Rate and Rhythm: Normal rate and regular rhythm.     Heart sounds: Normal heart sounds.  Pulmonary:     Effort: Pulmonary effort is normal.     Breath sounds: Normal breath sounds.  Musculoskeletal:     Cervical back: Normal range of motion.  Skin:    General: Skin is warm.  Neurological:     General: No focal deficit present.     Mental Status: She is alert.  Psychiatric:        Mood and Affect: Mood normal.        Behavior: Behavior normal.      Assessment And Plan:  Type 2 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 disease, without long-term current use of insulin (HCC) Assessment & Plan: Blood glucose generally well-controlled with occasional hypoglycemia. Discussed using peanut butter crackers instead of orange juice for hypoglycemia management. Dexcom sensor settings may need adjustment. -  Continue metformin  500 mg daily. - Continue Farxiga  10 mg daily. - Recommend using peanut butter crackers to prevent hypoglycemia instead of orange juice. - Provide additional Dexcom sensors for intermittent monitoring. - Consider adjusting Dexcom low glucose alert to 70 mg/dL.  Orders: -     CMP14+EGFR -     Hemoglobin A1c  Hypertensive heart and renal disease with renal failure, stage 1 through stage 4 or unspecified chronic kidney disease, without heart failure Assessment & Plan: Chronic, well controlled.  She will continue with amlodipine  5mg  daily, hydrochlorothiazide  25mg  1/2 tab daily and olmesartan  40mg  daily. She is encouraged to follow low sodium diet. She will f/u in four months.   Orders: -     CMP14+EGFR  Atherosclerosis of aorta [facility]) Assessment & Plan: Aortic plaque identified in 2019. Calcium  score test planned to assess cardiac calcifications. Results will guide cardiology referral.Chronic, LDL goal is less than 70.  She will continue with atorvastatin  40mg  daily. She is encouraged to follow heart healthy lifestyle.  - Order calcium  score test at the heart and vascular center. - Consider cardiology referral based on calcium  score results.  Orders: -     CT CARDIAC SCORING (SELF PAY ONLY); Future -     Lipoprotein A (LPA); Future  Pure hypercholesterolemia Assessment & Plan: On atorvastatin  for cholesterol management. Calcium  score test planned to assess cardiac calcifications due to aortic plaque and diabetes. - Continue atorvastatin  40 mg daily. - Order calcium  score test at the heart and vascular center. - Plan to check cholesterol levels during the October physical.  Orders: -     CT CARDIAC SCORING (SELF PAY ONLY); Future -     Lipoprotein A (LPA); Future  Other orders -     amLODIPine  Besylate; Take 1 tablet (5 mg total) by mouth daily.  Dispense: 90 tablet; Refill: 2 -     metFORMIN  HCl ER; Take 1 tablet (500 mg total) by mouth 2 (two) times daily.   Dispense: 180 tablet; Refill: 2   Return if symptoms worsen or fail to improve.  Patient was given opportunity to ask questions. Patient verbalized understanding of  the plan and was able to repeat key elements of the plan. All questions were answered to their satisfaction.    I, Brooke LOISE Slocumb, MD, have reviewed all documentation for this visit. The documentation on 12/13/23 for the exam, diagnosis, procedures, and orders are all accurate and complete.   IF YOU HAVE BEEN REFERRED TO A SPECIALIST, IT MAY TAKE 1-2 WEEKS TO SCHEDULE/PROCESS THE REFERRAL. IF YOU HAVE NOT HEARD FROM US /SPECIALIST IN TWO WEEKS, PLEASE GIVE US  A CALL AT [phone].   THE PATIENT IS ENCOURAGED TO PRACTICE SOCIAL DISTANCING DUE TO THE COVID-19 PANDEMIC.

## 2023-12-13 NOTE — Assessment & Plan Note (Signed)
Chronic, LDL goal is less than 70.  She will continue with atorvastatin 40mg  daily. She is encouraged to follow heart healthy lifestyle.

## 2023-12-13 NOTE — Progress Notes (Signed)
   12/13/2023  Patient ID: Brooke Brown, female   DOB: 12-12-1950, 73 y.o.   MRN: [id number] Pharmacy Quality Measure Review  This patient is appearing on a report for being at risk of failing the adherence measure for diabetes medications this calendar year.   Medication: Farxiga  Last fill date: 10/26/23 for 90 day supply  Insurance report was not up to date. No action needed at this time.  Per report, refill was due 10/05/23--Farxiga  was actually filled 10/26/23 for a 90 day supply.  HgA1c from March was 6.5%.  Patient had an appointment today and had labs drawn.  Will follow up later this week on the labs to assess blood glucose control.   [name], PharmD, [facility] Clinical Pharmacist [facility] [phone]

## 2023-12-13 NOTE — Patient Instructions (Signed)

## 2023-12-14 LAB — CMP14+EGFR
ALT: 17 IU/L (ref 0–32)
AST: 29 IU/L (ref 0–40)
Albumin: 4.1 g/dL (ref 3.8–4.8)
Alkaline Phosphatase: 59 IU/L (ref 44–121)
BUN/Creatinine Ratio: 17 (ref 12–28)
BUN: 13 mg/dL (ref 8–27)
Bilirubin Total: 0.3 mg/dL (ref 0.0–1.2)
CO2: 24 mmol/L (ref 20–29)
Calcium: 9.5 mg/dL (ref 8.7–10.3)
Chloride: 104 mmol/L (ref 96–106)
Creatinine, Ser: 0.76 mg/dL (ref 0.57–1.00)
Globulin, Total: 3 g/dL (ref 1.5–4.5)
Glucose: 80 mg/dL (ref 70–99)
Potassium: 4.1 mmol/L (ref 3.5–5.2)
Sodium: 140 mmol/L (ref 134–144)
Total Protein: 7.1 g/dL (ref 6.0–8.5)
eGFR: 82 mL/min/1.73 (ref >59)

## 2023-12-14 LAB — HEMOGLOBIN A1C
Est. average glucose Bld gHb Est-mCnc: 134 mg/dL
Hgb A1c MFr Bld: 6.3 % — ABNORMAL HIGH (ref 4.8–5.6)

## 2023-12-15 ENCOUNTER — Ambulatory Visit: Payer: Self-pay | Admitting: Internal Medicine

## 2023-12-17 NOTE — Assessment & Plan Note (Signed)
 On atorvastatin  for cholesterol management. Calcium  score test planned to assess cardiac calcifications due to aortic plaque and diabetes. - Continue atorvastatin  40 mg daily. - Order calcium  score test at the heart and vascular center. - Plan to check cholesterol levels during the October physical.

## 2023-12-17 NOTE — Assessment & Plan Note (Signed)
 Blood glucose generally well-controlled with occasional hypoglycemia. Discussed using peanut butter crackers instead of orange juice for hypoglycemia management. Dexcom sensor settings may need adjustment. - Continue metformin  500 mg daily. - Continue Farxiga  10 mg daily. - Recommend using peanut butter crackers to prevent hypoglycemia instead of orange juice. - Provide additional Dexcom sensors for intermittent monitoring. - Consider adjusting Dexcom low glucose alert to 70 mg/dL.

## 2023-12-21 ENCOUNTER — Encounter: Payer: Self-pay | Admitting: Pharmacist

## 2023-12-21 NOTE — Progress Notes (Signed)
   12/21/2023  Patient ID: Brooke Brown, female   DOB: 09-16-1950, 73 y.o.   MRN: [id number]  Patient was previously on the medication adherence report for diabetes.   Farxiga  10mg  was filled 10/26/23 for a 90 day supply Metformin  XR 500 mg filled 12/06/23 for a 90 day supply (previous fill 01/25)  Lab Results  Component Value Date   HGBA1C 6.3 (H) 12/13/2023   HGBA1C 6.5 (H) 08/11/2023   HGBA1C 6.6 (H) 05/05/2023   [name], PharmD, BCACP Clinical Pharmacist [phone]

## 2024-01-05 ENCOUNTER — Other Ambulatory Visit: Payer: Self-pay | Admitting: Internal Medicine

## 2024-01-05 DIAGNOSIS — F411 Generalized anxiety disorder: Secondary | ICD-10-CM

## 2024-01-12 ENCOUNTER — Ambulatory Visit (HOSPITAL_COMMUNITY)
Admission: RE | Admit: 2024-01-12 | Discharge: 2024-01-12 | Disposition: A | Discharge status: Home / Self Care | Source: Ambulatory Visit | Attending: Cardiovascular Disease | Admitting: Cardiovascular Disease

## 2024-01-12 DIAGNOSIS — I700 Atherosclerosis of aorta: Secondary | ICD-10-CM | POA: Insufficient documentation

## 2024-01-12 DIAGNOSIS — E7800 Pure hypercholesterolemia, unspecified: Secondary | ICD-10-CM | POA: Insufficient documentation

## 2024-01-20 ENCOUNTER — Other Ambulatory Visit: Payer: Self-pay | Admitting: Internal Medicine

## 2024-01-20 DIAGNOSIS — Z1231 Encounter for screening mammogram for malignant neoplasm of breast: Secondary | ICD-10-CM

## 2024-01-31 ENCOUNTER — Other Ambulatory Visit: Payer: Self-pay | Admitting: Internal Medicine

## 2024-02-09 ENCOUNTER — Other Ambulatory Visit: Payer: Self-pay

## 2024-02-09 DIAGNOSIS — E1122 Type 2 diabetes mellitus with diabetic chronic kidney disease: Secondary | ICD-10-CM

## 2024-02-09 MED ORDER — ONETOUCH ULTRASOFT LANCETS MISC: 12 refills | Status: DC

## 2024-02-11 ENCOUNTER — Other Ambulatory Visit: Payer: Self-pay

## 2024-02-11 ENCOUNTER — Encounter: Payer: Self-pay | Admitting: Internal Medicine

## 2024-02-11 DIAGNOSIS — N182 Chronic kidney disease, stage 2 (mild): Secondary | ICD-10-CM

## 2024-02-11 MED ORDER — ONETOUCH ULTRASOFT LANCETS MISC: 12 refills | Status: DC

## 2024-02-15 ENCOUNTER — Other Ambulatory Visit: Payer: Self-pay

## 2024-02-15 DIAGNOSIS — E1122 Type 2 diabetes mellitus with diabetic chronic kidney disease: Secondary | ICD-10-CM

## 2024-02-15 MED ORDER — ONETOUCH ULTRASOFT LANCETS MISC: 12 refills | Status: DC

## 2024-02-17 ENCOUNTER — Other Ambulatory Visit: Payer: Self-pay

## 2024-02-17 DIAGNOSIS — E1122 Type 2 diabetes mellitus with diabetic chronic kidney disease: Secondary | ICD-10-CM

## 2024-02-17 MED ORDER — ONETOUCH ULTRASOFT LANCETS MISC: 12 refills | Status: DC

## 2024-02-24 ENCOUNTER — Other Ambulatory Visit: Payer: Self-pay

## 2024-02-24 DIAGNOSIS — E1122 Type 2 diabetes mellitus with diabetic chronic kidney disease: Secondary | ICD-10-CM

## 2024-02-24 MED ORDER — ONETOUCH ULTRASOFT LANCETS MISC: 12 refills | Status: DC

## 2024-02-26 ENCOUNTER — Other Ambulatory Visit: Payer: Self-pay | Admitting: Internal Medicine

## 2024-03-03 ENCOUNTER — Ambulatory Visit
Admission: RE | Admit: 2024-03-03 | Discharge: 2024-03-03 | Disposition: A | Discharge status: Home / Self Care | Source: Ambulatory Visit | Attending: Internal Medicine | Admitting: Internal Medicine

## 2024-03-03 DIAGNOSIS — Z1231 Encounter for screening mammogram for malignant neoplasm of breast: Secondary | ICD-10-CM

## 2024-03-07 ENCOUNTER — Other Ambulatory Visit

## 2024-03-07 ENCOUNTER — Other Ambulatory Visit: Payer: Self-pay

## 2024-03-07 DIAGNOSIS — E7800 Pure hypercholesterolemia, unspecified: Secondary | ICD-10-CM

## 2024-03-07 DIAGNOSIS — I700 Atherosclerosis of aorta: Secondary | ICD-10-CM

## 2024-03-07 DIAGNOSIS — E1122 Type 2 diabetes mellitus with diabetic chronic kidney disease: Secondary | ICD-10-CM

## 2024-03-07 MED ORDER — BLOOD GLUCOSE MONITORING SUPPL W/DEVICE KIT: PACK | 1 refills | Status: AC

## 2024-03-08 ENCOUNTER — Ambulatory Visit: Payer: Self-pay | Admitting: Internal Medicine

## 2024-03-08 ENCOUNTER — Ambulatory Visit: Payer: PPO | Admitting: Internal Medicine

## 2024-03-08 ENCOUNTER — Encounter: Payer: Self-pay | Admitting: Internal Medicine

## 2024-03-08 VITALS — BP 124/80 | Temp 98.1°F | Ht 65.0 in | Wt 150.0 lb

## 2024-03-08 DIAGNOSIS — F411 Generalized anxiety disorder: Secondary | ICD-10-CM

## 2024-03-08 DIAGNOSIS — I359 Nonrheumatic aortic valve disorder, unspecified: Secondary | ICD-10-CM

## 2024-03-08 DIAGNOSIS — I700 Atherosclerosis of aorta: Secondary | ICD-10-CM | POA: Diagnosis not present

## 2024-03-08 DIAGNOSIS — E890 Postprocedural hypothyroidism: Secondary | ICD-10-CM

## 2024-03-08 DIAGNOSIS — Z0000 Encounter for general adult medical examination without abnormal findings: Secondary | ICD-10-CM | POA: Diagnosis not present

## 2024-03-08 DIAGNOSIS — E1122 Type 2 diabetes mellitus with diabetic chronic kidney disease: Secondary | ICD-10-CM

## 2024-03-08 DIAGNOSIS — E7800 Pure hypercholesterolemia, unspecified: Secondary | ICD-10-CM | POA: Diagnosis not present

## 2024-03-08 DIAGNOSIS — I1310 Hypertensive heart and chronic kidney disease without heart failure, with stage 1 through stage 4 chronic kidney disease, or unspecified chronic kidney disease: Secondary | ICD-10-CM | POA: Diagnosis not present

## 2024-03-08 DIAGNOSIS — N182 Chronic kidney disease, stage 2 (mild): Secondary | ICD-10-CM | POA: Diagnosis not present

## 2024-03-08 DIAGNOSIS — F5104 Psychophysiologic insomnia: Secondary | ICD-10-CM

## 2024-03-08 LAB — POCT URINALYSIS DIP (CLINITEK)
Bilirubin, UA: NEGATIVE
Glucose, UA: 500 mg/dL — AB
Ketones, POC UA: NEGATIVE mg/dL
Leukocytes, UA: NEGATIVE
Nitrite, UA: NEGATIVE
POC PROTEIN,UA: NEGATIVE
Spec Grav, UA: 1.02 (ref 1.010–1.025)
Urobilinogen, UA: 0.2 U/dL
pH, UA: 5.5 (ref 5.0–8.0)

## 2024-03-08 MED ORDER — XIGDUO XR 10-500 MG PO TB24
1.0000 | ORAL_TABLET | Freq: Every day | ORAL | 5 refills | Status: AC
Start: 2024-03-08 — End: ?

## 2024-03-08 NOTE — Progress Notes (Signed)
 I,Victoria T Emmitt, CMA,acting as a Neurosurgeon for Catheryn LOISE Slocumb, MD.,have documented all relevant documentation on the behalf of Catheryn LOISE Slocumb, MD,as directed by  Catheryn LOISE Slocumb, MD while in the presence of Catheryn LOISE Slocumb, MD.  Subjective:    Patient ID: Brooke Brown , female    DOB: 1950-11-12 , 73 y.o.   MRN: [id number]  Chief Complaint  Patient presents with   Annual Exam    The patient is here today for a physical examination. She is followed by GYN for her pelvic exams. She reports compliance with meds. She denies headaches, chest pain and shortness of breath.  She states having issues with sleeping at night. She will at most get 2-3 hours of sleep.    Diabetes   Hypertension   Hyperlipidemia    HPI Discussed the use of AI scribe software for clinical note transcription with the patient, who gave verbal consent to proceed.  History of Present Illness Brooke Brown is a 73 year old female with diabetes and hypertension who presents for a routine physical exam and management of her chronic conditions.  She feels well today with no current pain and reports sleeping well last night. She experiences ongoing anxiety, particularly when family members are traveling, and takes sertraline  50 mg once daily, which she describes as a 'baby dose.'  Her current medications include amlodipine  5 mg, aspirin, atorvastatin  40 mg, Farxiga  10 mg, hydrochlorothiazide  25 mg, olmesartan  40 mg, metformin  500 mg once daily, and Synthroid  112 mcg Monday through Saturday. She notes a discrepancy in her metformin  prescription, taking it once daily instead of the prescribed twice daily.  She recently received both her flu and COVID-19 vaccinations at [facility] on [address], with the COVID-19 vaccine being [employer]. She is unsure of the specific flu vaccine received.  She has no issues with appetite and maintains a regimen of eating snacks between early service and Sunday school  to manage blood sugar levels. No recent episodes of hypoglycemia, though she is embarrassed by past episodes. She monitors bowel movements and adjusts fiber intake as needed.  She has a history of a cardiac calcium  score of 76 and an echocardiogram showing mild calcification and thickening of the aortic valve. She had a bone density test in May and a mammogram on the tenth of this month.  She mentions having excess Synthroid  due to automatic refills and has recently opted out of this service. No issues with bowels and is not currently taking magnesium  but is open to trying it for anxiety relief.   Diabetes She presents for her follow-up diabetic visit. She has type 2 diabetes mellitus. Her disease course has been stable. There are no hypoglycemic associated symptoms. Pertinent negatives for diabetes include no blurred vision, no polydipsia, no polyphagia and no polyuria. There are no hypoglycemic complications. Diabetic complications include nephropathy. Risk factors for coronary artery disease include diabetes mellitus, dyslipidemia, hypertension, post-menopausal and sedentary lifestyle. Her weight is stable. She is following a diabetic diet. She participates in exercise three times a week. Her breakfast blood glucose is taken between 8-9 am. Her breakfast blood glucose range is generally 110-130 mg/dl. Eye exam is current.  Hypertension This is a chronic problem. The current episode started more than 1 year ago. The problem has been gradually improving since onset. The problem is controlled. Pertinent negatives include no blurred vision, neck pain or palpitations.     Past Medical History:  Diagnosis Date   Anxiety    Arthritis  Diabetes mellitus    Grave's disease    Grave's disease    Hyperlipemia    Hypertension      Family History  Problem Relation Age of Onset   Pancreatic cancer Mother    Cancer Mother    Hypertension Mother    Heart failure Father    Hypertension Father     Diabetes Brother    Breast cancer Cousin      Current Outpatient Medications:    acetaminophen  (TYLENOL ) 500 MG tablet, Take 500 mg by mouth daily as needed for moderate pain or headache., Disp: , Rfl:    amLODipine  (NORVASC ) 5 MG tablet, Take 1 tablet (5 mg total) by mouth daily., Disp: 90 tablet, Rfl: 2   aspirin EC 81 MG tablet, Take 81 mg by mouth daily., Disp: , Rfl:    atorvastatin  (LIPITOR) 40 MG tablet, TAKE 1 TABLET BY MOUTH ON SUNDAY-SATURDAY, Disp: 90 tablet, Rfl: 0   Blood Glucose Monitoring Suppl w/Device KIT, USE ONCE DAILY TO CHECK BLOOD SUGARS., Disp: 1 kit, Rfl: 1   Carboxymethylcellul-Glycerin (LUBRICATING EYE DROPS OP), Place 1 drop into both eyes 2 (two) times daily., Disp: , Rfl:    Dapagliflozin  Pro-metFORMIN  ER (XIGDUO XR) 10-500 MG TB24, Take 1 tablet by mouth daily., Disp: 30 tablet, Rfl: 5   dapagliflozin  propanediol (FARXIGA ) 10 MG TABS tablet, TAKE 1 TABLET BY MOUTH ONCE DAILY BEFORE BREAKFAST, Disp: 90 tablet, Rfl: 0   hydrochlorothiazide  (HYDRODIURIL ) 25 MG tablet, Patient reports taking 1/2 tab in the morning. PER RS., Disp: 45 tablet, Rfl: 2   Magnesium  250 MG TABS, otc (Patient taking differently: Take 250 mg by mouth daily. otc), Disp:  , Rfl: 0   metFORMIN  (GLUCOPHAGE -XR) 500 MG 24 hr tablet, Take 1 tablet (500 mg total) by mouth 2 (two) times daily., Disp: 180 tablet, Rfl: 2   olmesartan  (BENICAR ) 40 MG tablet, Take 1 tablet by mouth once daily, Disp: 90 tablet, Rfl: 1   ONETOUCH ULTRA test strip, USE 1 STRIP TO CHECK GLUCOSE ONCE DAILY, Disp: 100 each, Rfl: 0   sertraline  (ZOLOFT ) 50 MG tablet, Take 1 tablet by mouth once daily, Disp: 90 tablet, Rfl: 2   SYNTHROID  112 MCG tablet, TAKE 1 TABLET MONDAY THROUGH SATURDAY AND SKIP SUNDAYS., Disp: 90 tablet, Rfl: 2   Blood Glucose Monitoring Suppl (ONE TOUCH ULTRA 2) w/Device KIT, Use to check blood sugar 3 times a day. Dx code e11.65 (Patient not taking: Reported on 03/08/2024), Disp: 1 kit, Rfl: 2   Lancets  (ONETOUCH DELICA PLUS LANCET30G) MISC, USE ONCE DAILY TO CHECK BLOOD SUGARS., Disp: 100 each, Rfl: 2   No Known Allergies    The patient states she uses post menopausal status for birth control. No LMP recorded. Patient has had a hysterectomy.. Negative for Dysmenorrhea. Negative for: breast discharge, breast lump(s), breast pain and breast self exam. Associated symptoms include abnormal vaginal bleeding. Pertinent negatives include abnormal bleeding (hematology), anxiety, decreased libido, depression, difficulty falling sleep, dyspareunia, history of infertility, nocturia, sexual dysfunction, sleep disturbances, urinary incontinence, urinary urgency, vaginal discharge and vaginal itching. Diet regular.The patient states her exercise level is  moderate - walking.   . The patient's tobacco use is:  Social History   Tobacco Use  Smoking Status Never  Smokeless Tobacco Never  . She has been exposed to passive smoke. The patient's alcohol use is:  Social History   Substance and Sexual Activity  Alcohol Use Not Currently   Comment: rarely    Review of  Systems  Constitutional: Negative.   HENT: Negative.    Eyes: Negative.  Negative for blurred vision.  Respiratory: Negative.    Cardiovascular: Negative.  Negative for palpitations.  Gastrointestinal: Negative.   Endocrine: Negative.  Negative for polydipsia, polyphagia and polyuria.  Genitourinary: Negative.   Musculoskeletal: Negative.  Negative for neck pain.  Skin: Negative.   Allergic/Immunologic: Negative.   Neurological: Negative.   Hematological: Negative.   Psychiatric/Behavioral: Negative.       Today's Vitals   03/08/24 1050  BP: 124/80  Temp: 98.1 F (36.7 C)  SpO2: 98%  Weight: 160 lb 9.6 oz (72.8 kg)  Height: 5' 5 (1.651 m)   Body mass index is 26.73 kg/m.  Wt Readings from Last 3 Encounters:  03/08/24 160 lb 9.6 oz (72.8 kg)  12/13/23 150 lb 9.6 oz (68.3 kg)  08/11/23 148 lb (67.1 kg)     Objective:   Physical Exam Vitals and nursing note reviewed.  Constitutional:      Appearance: Normal appearance.  HENT:     Head: Normocephalic and atraumatic.     Right Ear: Tympanic membrane, ear canal and external ear normal.     Left Ear: Tympanic membrane, ear canal and external ear normal.     Nose: Nose normal.     Mouth/Throat:     Mouth: Mucous membranes are moist.     Pharynx: Oropharynx is clear.  Eyes:     Extraocular Movements: Extraocular movements intact.     Conjunctiva/sclera: Conjunctivae normal.     Pupils: Pupils are equal, round, and reactive to light.  Cardiovascular:     Rate and Rhythm: Normal rate and regular rhythm.     Pulses: Normal pulses.          Dorsalis pedis pulses are 2+ on the right side and 2+ on the left side.     Heart sounds: Normal heart sounds.  Pulmonary:     Effort: Pulmonary effort is normal.     Breath sounds: Normal breath sounds.  Chest:  Breasts:    Tanner Score is 5.     Right: Normal.     Left: Normal.  Abdominal:     General: Abdomen is flat. Bowel sounds are normal.     Palpations: Abdomen is soft.  Genitourinary:    Comments: deferred Musculoskeletal:        General: Normal range of motion.     Cervical back: Normal range of motion and neck supple.  Feet:     Right foot:     Protective Sensation: 5 sites tested.  5 sites sensed.     Skin integrity: Skin integrity normal.     Toenail Condition: Right toenails are normal.     Left foot:     Protective Sensation: 5 sites tested.  5 sites sensed.     Skin integrity: Skin integrity normal.     Toenail Condition: Left toenails are normal.  Skin:    General: Skin is warm and dry.  Neurological:     General: No focal deficit present.     Mental Status: She is alert and oriented to person, place, and time.  Psychiatric:        Mood and Affect: Mood normal.        Behavior: Behavior normal.      Assessment And Plan:     Annual physical exam Assessment & Plan: A full exam  was performed.  Importance of monthly self breast exams was discussed with the patient.  She is advised to get 30-45 minutes of regular exercise, no less than four to five days per week. Both weight-bearing and aerobic exercises are recommended.  She is advised to follow a healthy diet with at least six fruits/veggies per day, decrease intake of red meat and other saturated fats and to increase fish intake to twice weekly.  Meats/fish should not be fried -- baked, boiled or broiled is preferable. It is also important to cut back on your sugar intake.  Be sure to read labels - try to avoid anything with added sugar, high fructose corn syrup or other sweeteners.  If you must use a sweetener, you can try stevia or monkfruit.  It is also important to avoid artificially sweetened foods/beverages and diet drinks. Lastly, wear SPF 50 sunscreen on exposed skin and when in direct sunlight for an extended period of time.  Be sure to avoid fast food restaurants and aim for at least 60 ounces of water daily.       Type 2 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 disease, without long-term current use of insulin (HCC) Assessment & Plan: Chronic, diabetic foot exam was performed.  Type 2 diabetes with associated chronic kidney disease. Discussed combining medications into Xigduo for convenience. She prefers this change if cost remains stable. - Prescribe Xigduo and send to pharmacy. - Check A1c levels during next lab work. - I DISCUSSED WITH THE PATIENT AT LENGTH REGARDING THE GOALS OF GLYCEMIC CONTROL AND POSSIBLE LONG-TERM COMPLICATIONS.  I  ALSO STRESSED THE IMPORTANCE OF COMPLIANCE WITH HOME GLUCOSE MONITORING, DIETARY RESTRICTIONS INCLUDING AVOIDANCE OF SUGARY DRINKS/PROCESSED FOODS,  ALONG WITH REGULAR EXERCISE.  I  ALSO STRESSED THE IMPORTANCE OF ANNUAL EYE EXAMS, SELF FOOT CARE AND COMPLIANCE WITH OFFICE VISITS.   Orders: -     CBC; Future -     CMP14+EGFR; Future -     Lipid panel; Future -      Hemoglobin A1c; Future  Hypertensive heart and renal disease with renal failure, stage 1 through stage 4 or unspecified chronic kidney disease, without heart failure Assessment & Plan: Chronic, well controlled.  EKG performed, NSR w/o acute changes.  She will continue with amlodipine  5mg  daily, hydrochlorothiazide  25mg  1/2 tab daily and olmesartan  40mg  daily. She is encouraged to follow low sodium diet. She will f/u in four months.   Orders: -     POCT URINALYSIS DIP (CLINITEK) -     Microalbumin / creatinine urine ratio -     EKG 12-Lead -     CMP14+EGFR; Future -     Lipid panel; Future  Atherosclerosis of aorta Assessment & Plan: Aortic plaque identified in 2019. Chronic, LDL goal is less than 70.  She will continue with atorvastatin  40mg  daily. She is encouraged to follow heart healthy lifestyle.     Pure hypercholesterolemia Assessment & Plan: Hypercholesterolemia managed with atorvastatin  40 mg. Recent cardiac calcium  score of 76. Lipoprotein A test ordered to assess genetic predisposition to heart disease. - Continue atorvastatin  40 mg daily. - Check cholesterol levels during next lab work.  Orders: -     CMP14+EGFR; Future -     Lipid panel; Future -     TSH + free T4; Future  Postablative hypothyroidism Assessment & Plan: Postprocedural hypothyroidism managed with Synthroid  112 mcg Monday through Saturday.  - Continue current Synthroid  regimen. - Check thyroid  function during next lab work.  Orders: -     Lipid panel; Future -     TSH +  free T4; Future  Generalized anxiety disorder Assessment & Plan: Generalized anxiety disorder managed with sertraline  50 mg daily. Reports ongoing anxiety, especially related to family safety. Discussed potential use of magnesium  glycinate and L-theanine as natural supplements. She prefers natural options before considering prescription changes. - Provide samples of magnesium  glycinate and L-theanine for trial. - Evaluate  effectiveness of supplements before considering prescription changes.   Aortic valve calcification Assessment & Plan: Echocardiogram shows mild calcification and thickening of the aortic valve, typical with aging. - Schedule follow-up echocardiogram next year to monitor aortic valve calcification.   Other orders -     Xigduo XR; Take 1 tablet by mouth daily.  Dispense: 30 tablet; Refill: 5   Return for 1 YEAR HM, 4 month dm f/u.SABRA Patient was given opportunity to ask questions. Patient verbalized understanding of the plan and was able to repeat key elements of the plan. All questions were answered to their satisfaction.   I, Catheryn LOISE Slocumb, MD, have reviewed all documentation for this visit. The documentation on 03/08/24 for the exam, diagnosis, procedures, and orders are all accurate and complete.

## 2024-03-08 NOTE — Patient Instructions (Addendum)
 Magnesium  glycinate and L-theanine  Health Maintenance, Female Adopting a healthy lifestyle and getting preventive care are important in promoting health and wellness. Ask your health care provider about: The right schedule for you to have regular tests and exams. Things you can do on your own to prevent diseases and keep yourself healthy. What should I know about diet, weight, and exercise? Eat a healthy diet  Eat a diet that includes plenty of vegetables, fruits, low-fat dairy products, and lean protein. Do not eat a lot of foods that are high in solid fats, added sugars, or sodium. Maintain a healthy weight Body mass index (BMI) is used to identify weight problems. It estimates body fat based on height and weight. Your health care provider can help determine your BMI and help you achieve or maintain a healthy weight. Get regular exercise Get regular exercise. This is one of the most important things you can do for your health. Most adults should: Exercise for at least 150 minutes each week. The exercise should increase your heart rate and make you sweat (moderate-intensity exercise). Do strengthening exercises at least twice a week. This is in addition to the moderate-intensity exercise. Spend less time sitting. Even light physical activity can be beneficial. Watch cholesterol and blood lipids Have your blood tested for lipids and cholesterol at 75 years of age, then have this test every 5 years. Have your cholesterol levels checked more often if: Your lipid or cholesterol levels are high. You are older than 75 years of age. You are at high risk for heart disease. What should I know about cancer screening? Depending on your health history and family history, you may need to have cancer screening at various ages. This may include screening for: Breast cancer. Cervical cancer. Colorectal cancer. Skin cancer. Lung cancer. What should I know about heart disease, diabetes, and high blood  pressure? Blood pressure and heart disease High blood pressure causes heart disease and increases the risk of stroke. This is more likely to develop in people who have high blood pressure readings or are overweight. Have your blood pressure checked: Every 3-5 years if you are 69-62 years of age. Every year if you are 74 years old or older. Diabetes Have regular diabetes screenings. This checks your fasting blood sugar level. Have the screening done: Once every three years after age 27 if you are at a normal weight and have a low risk for diabetes. More often and at a younger age if you are overweight or have a high risk for diabetes. What should I know about preventing infection? Hepatitis B If you have a higher risk for hepatitis B, you should be screened for this virus. Talk with your health care provider to find out if you are at risk for hepatitis B infection. Hepatitis C Testing is recommended for: Everyone born from [birth year] through 1967. Anyone with known risk factors for hepatitis C. Sexually transmitted infections (STIs) Get screened for STIs, including gonorrhea and chlamydia, if: You are sexually active and are younger than 76 years of age. You are older than 75 years of age and your health care provider tells you that you are at risk for this type of infection. Your sexual activity has changed since you were last screened, and you are at increased risk for chlamydia or gonorrhea. Ask your health care provider if you are at risk. Ask your health care provider about whether you are at high risk for HIV. Your health care provider may recommend a prescription  medicine to help prevent HIV infection. If you choose to take medicine to prevent HIV, you should first get tested for HIV. You should then be tested every 3 months for as long as you are taking the medicine. Pregnancy If you are about to stop having your period (premenopausal) and you may become pregnant, seek counseling before you  get pregnant. Take 400 to 800 micrograms (mcg) of folic acid every day if you become pregnant. Ask for birth control (contraception) if you want to prevent pregnancy. Osteoporosis and menopause Osteoporosis is a disease in which the bones lose minerals and strength with aging. This can result in bone fractures. If you are 45 years old or older, or if you are at risk for osteoporosis and fractures, ask your health care provider if you should: Be screened for bone loss. Take a calcium  or vitamin D  supplement to lower your risk of fractures. Be given hormone replacement therapy (HRT) to treat symptoms of menopause. Follow these instructions at home: Alcohol use Do not drink alcohol if: Your health care provider tells you not to drink. You are pregnant, may be pregnant, or are planning to become pregnant. If you drink alcohol: Limit how much you have to: 0-1 drink a day. Know how much alcohol is in your drink. In the [place] one drink equals one 12 oz bottle of beer (355 mL), one 5 oz glass of wine (148 mL), or one 1 oz glass of hard liquor (44 mL). Lifestyle Do not use any products that contain nicotine or tobacco. These products include cigarettes, chewing tobacco, and vaping devices, such as e-cigarettes. If you need help quitting, ask your health care provider. Do not use street drugs. Do not share needles. Ask your health care provider for help if you need support or information about quitting drugs. General instructions Schedule regular health, dental, and eye exams. Stay current with your vaccines. Tell your health care provider if: You often feel depressed. You have ever been abused or do not feel safe at home. Summary Adopting a healthy lifestyle and getting preventive care are important in promoting health and wellness. Follow your health care provider's instructions about healthy diet, exercising, and getting tested or screened for diseases. Follow your health care provider's  instructions on monitoring your cholesterol and blood pressure. This information is not intended to replace advice given to you by your health care provider. Make sure you discuss any questions you have with your health care provider. Document Revised: 09/30/2020 Document Reviewed: 09/30/2020 Elsevier Patient Education  2024 [employer].

## 2024-03-09 ENCOUNTER — Other Ambulatory Visit: Payer: Self-pay | Admitting: Internal Medicine

## 2024-03-09 DIAGNOSIS — R3129 Other microscopic hematuria: Secondary | ICD-10-CM

## 2024-03-09 LAB — MICROALBUMIN / CREATININE URINE RATIO
Creatinine, Urine: 291.6 mg/dL
Microalb/Creat Ratio: 3 mg/g{creat} (ref 0–29)
Microalbumin, Urine: 9 ug/mL

## 2024-03-09 LAB — LIPOPROTEIN A (LPA): Lipoprotein (a): 146.9 nmol/L — ABNORMAL HIGH (ref <75.0)

## 2024-03-10 ENCOUNTER — Other Ambulatory Visit: Payer: Self-pay

## 2024-03-10 ENCOUNTER — Other Ambulatory Visit: Payer: Self-pay | Admitting: Internal Medicine

## 2024-03-10 DIAGNOSIS — I1310 Hypertensive heart and chronic kidney disease without heart failure, with stage 1 through stage 4 chronic kidney disease, or unspecified chronic kidney disease: Secondary | ICD-10-CM

## 2024-03-10 DIAGNOSIS — E7800 Pure hypercholesterolemia, unspecified: Secondary | ICD-10-CM | POA: Diagnosis not present

## 2024-03-10 DIAGNOSIS — N182 Chronic kidney disease, stage 2 (mild): Secondary | ICD-10-CM | POA: Diagnosis not present

## 2024-03-10 DIAGNOSIS — E1122 Type 2 diabetes mellitus with diabetic chronic kidney disease: Secondary | ICD-10-CM

## 2024-03-10 DIAGNOSIS — E890 Postprocedural hypothyroidism: Secondary | ICD-10-CM

## 2024-03-10 MED ORDER — ONETOUCH DELICA PLUS LANCET30G MISC: 2 refills | Status: AC

## 2024-03-12 DIAGNOSIS — I359 Nonrheumatic aortic valve disorder, unspecified: Secondary | ICD-10-CM | POA: Insufficient documentation

## 2024-03-12 NOTE — Assessment & Plan Note (Addendum)
 Echocardiogram shows mild calcification and thickening of the aortic valve, typical with aging. - Schedule follow-up echocardiogram next year to monitor aortic valve calcification.

## 2024-03-12 NOTE — Assessment & Plan Note (Signed)
 Aortic plaque identified in 2019. Chronic, LDL goal is less than 70.  She will continue with atorvastatin  40mg  daily. She is encouraged to follow heart healthy lifestyle.

## 2024-03-12 NOTE — Assessment & Plan Note (Signed)
 Postprocedural hypothyroidism managed with Synthroid  112 mcg Monday through Saturday.  - Continue current Synthroid  regimen. - Check thyroid  function during next lab work.

## 2024-03-12 NOTE — Assessment & Plan Note (Signed)
 Generalized anxiety disorder managed with sertraline  50 mg daily. Reports ongoing anxiety, especially related to family safety. Discussed potential use of magnesium  glycinate and L-theanine as natural supplements. She prefers natural options before considering prescription changes. - Provide samples of magnesium  glycinate and L-theanine for trial. - Evaluate effectiveness of supplements before considering prescription changes.

## 2024-03-12 NOTE — Assessment & Plan Note (Signed)
 Hypercholesterolemia managed with atorvastatin  40 mg. Recent cardiac calcium  score of 76. Lipoprotein A test ordered to assess genetic predisposition to heart disease. - Continue atorvastatin  40 mg daily. - Check cholesterol levels during next lab work.

## 2024-03-12 NOTE — Assessment & Plan Note (Signed)

## 2024-03-12 NOTE — Assessment & Plan Note (Signed)
 Chronic, diabetic foot exam was performed.  Type 2 diabetes with associated chronic kidney disease. Discussed combining medications into Xigduo for convenience. She prefers this change if cost remains stable. - Prescribe Xigduo and send to pharmacy. - Check A1c levels during next lab work. - I DISCUSSED WITH THE PATIENT AT LENGTH REGARDING THE GOALS OF GLYCEMIC CONTROL AND POSSIBLE LONG-TERM COMPLICATIONS.  I  ALSO STRESSED THE IMPORTANCE OF COMPLIANCE WITH HOME GLUCOSE MONITORING, DIETARY RESTRICTIONS INCLUDING AVOIDANCE OF SUGARY DRINKS/PROCESSED FOODS,  ALONG WITH REGULAR EXERCISE.  I  ALSO STRESSED THE IMPORTANCE OF ANNUAL EYE EXAMS, SELF FOOT CARE AND COMPLIANCE WITH OFFICE VISITS.

## 2024-03-12 NOTE — Assessment & Plan Note (Signed)
 Chronic, well controlled.  EKG performed, NSR w/o acute changes.  She will continue with amlodipine  5mg  daily, hydrochlorothiazide  25mg  1/2 tab daily and olmesartan  40mg  daily. She is encouraged to follow low sodium diet. She will f/u in four months.

## 2024-03-13 ENCOUNTER — Ambulatory Visit: Payer: Self-pay | Admitting: Internal Medicine

## 2024-03-13 LAB — CBC
Hematocrit: 42.3 % (ref 34.0–46.6)
Hemoglobin: 13.5 g/dL (ref 11.1–15.9)
MCH: 28.5 pg (ref 26.6–33.0)
MCHC: 31.9 g/dL (ref 31.5–35.7)
MCV: 89 fL (ref 79–97)
Platelets: 319 x10E3/uL (ref 150–450)
RBC: 4.74 x10E6/uL (ref 3.77–5.28)
RDW: 12.8 % (ref 11.7–15.4)
WBC: 4.2 x10E3/uL (ref 3.4–10.8)

## 2024-03-13 LAB — CMP14+EGFR
ALT: 16 IU/L (ref 0–32)
AST: 31 IU/L (ref 0–40)
Albumin: 4.1 g/dL (ref 3.8–4.8)
Alkaline Phosphatase: 63 IU/L (ref 49–135)
BUN/Creatinine Ratio: 20 (ref 12–28)
BUN: 14 mg/dL (ref 8–27)
Bilirubin Total: 0.3 mg/dL (ref 0.0–1.2)
CO2: 24 mmol/L (ref 20–29)
Calcium: 9.3 mg/dL (ref 8.7–10.3)
Chloride: 102 mmol/L (ref 96–106)
Creatinine, Ser: 0.71 mg/dL (ref 0.57–1.00)
Globulin, Total: 2.8 g/dL (ref 1.5–4.5)
Glucose: 97 mg/dL (ref 70–99)
Potassium: 4.2 mmol/L (ref 3.5–5.2)
Sodium: 141 mmol/L (ref 134–144)
Total Protein: 6.9 g/dL (ref 6.0–8.5)
eGFR: 89 mL/min/1.73 (ref >59)

## 2024-03-13 LAB — HEMOGLOBIN A1C
Est. average glucose Bld gHb Est-mCnc: 140 mg/dL
Hgb A1c MFr Bld: 6.5 % — ABNORMAL HIGH (ref 4.8–5.6)

## 2024-03-13 LAB — TSH+FREE T4
Free T4: 1.72 ng/dL (ref 0.82–1.77)
TSH: 3.02 u[IU]/mL (ref 0.450–4.500)

## 2024-03-13 LAB — LIPID PANEL
Chol/HDL Ratio: 2.8 ratio (ref 0.0–4.4)
Cholesterol, Total: 146 mg/dL (ref 100–199)
HDL: 52 mg/dL (ref >39)
LDL Chol Calc (NIH): 79 mg/dL (ref 0–99)
Triglycerides: 74 mg/dL (ref 0–149)
VLDL Cholesterol Cal: 15 mg/dL (ref 5–40)

## 2024-03-17 ENCOUNTER — Ambulatory Visit
Admission: RE | Admit: 2024-03-17 | Discharge: 2024-03-17 | Disposition: A | Discharge status: Home / Self Care | Source: Ambulatory Visit | Attending: Internal Medicine | Admitting: Internal Medicine

## 2024-03-17 ENCOUNTER — Ambulatory Visit: Payer: Self-pay | Admitting: Internal Medicine

## 2024-03-17 DIAGNOSIS — N281 Cyst of kidney, acquired: Secondary | ICD-10-CM | POA: Diagnosis not present

## 2024-03-17 DIAGNOSIS — R3129 Other microscopic hematuria: Secondary | ICD-10-CM

## 2024-03-27 ENCOUNTER — Encounter: Payer: Self-pay | Admitting: Radiology

## 2024-04-19 ENCOUNTER — Other Ambulatory Visit: Payer: Self-pay | Admitting: Internal Medicine

## 2024-05-22 ENCOUNTER — Other Ambulatory Visit: Payer: Self-pay | Admitting: Internal Medicine

## 2024-05-22 MED ORDER — SYNTHROID 112 MCG PO TABS: ORAL_TABLET | ORAL | 2 refills | Status: AC

## 2024-05-22 NOTE — Telephone Encounter (Signed)
 Copied from CRM [id number]. Topic: Clinical - Medication Refill >> May 22, 2024 12:49 PM Kevelyn M wrote: Medication: SYNTHROID  112 MCG tablet   Has the patient contacted their pharmacy? Yes (Agent: If no, request that the patient contact the pharmacy for the refill. If patient does not wish to contact the pharmacy document the reason why and proceed with request.) (Agent: If yes, when and what did the pharmacy advise?)  This is the patient's preferred pharmacy:  [facility] - [place], [place] - [address] [place] [address] Phone: [phone] Fax: [phone]  Is this the correct pharmacy for this prescription? Yes If no, delete pharmacy and type the correct one.   Has the prescription been filled recently? No  Is the patient out of the medication? No  Has the patient been seen for an appointment in the last year OR does the patient have an upcoming appointment? Yes  Can we respond through MyChart? Yes  Agent: Please be advised that Rx refills may take up to 3 business days. We ask that you follow-up with your pharmacy.

## 2024-05-24 ENCOUNTER — Other Ambulatory Visit: Payer: Self-pay | Admitting: Internal Medicine

## 2024-05-25 ENCOUNTER — Other Ambulatory Visit: Payer: Self-pay | Admitting: Internal Medicine

## 2024-05-29 ENCOUNTER — Other Ambulatory Visit: Payer: Self-pay | Admitting: Internal Medicine

## 2024-06-04 ENCOUNTER — Other Ambulatory Visit: Payer: Self-pay | Admitting: Internal Medicine

## 2024-06-06 ENCOUNTER — Other Ambulatory Visit: Payer: Self-pay

## 2024-06-06 MED ORDER — ACCU-CHEK SOFTCLIX LANCETS MISC: 1 refills | Status: AC

## 2024-06-06 MED ORDER — ACCU-CHEK GUIDE W/DEVICE KIT: PACK | 1 refills | Status: AC

## 2024-06-06 MED ORDER — ACCU-CHEK GUIDE TEST VI STRP: ORAL_STRIP | 2 refills | Status: AC

## 2024-07-13 ENCOUNTER — Ambulatory Visit: Payer: Self-pay | Admitting: Internal Medicine

## 2024-07-26 ENCOUNTER — Ambulatory Visit: Payer: HMO

## 2025-03-13 ENCOUNTER — Encounter: Payer: Self-pay | Admitting: Internal Medicine
